# Patient Record
Sex: Female | Born: 1945
Health system: Southern US, Community
[De-identification: ages and names within clinical notes are randomized; demographics above are authoritative.]

## PROBLEM LIST (undated history)

## (undated) DIAGNOSIS — N390 Urinary tract infection, site not specified: Secondary | ICD-10-CM

## (undated) DIAGNOSIS — F0390 Unspecified dementia without behavioral disturbance: Secondary | ICD-10-CM

## (undated) DIAGNOSIS — I10 Essential (primary) hypertension: Secondary | ICD-10-CM

## (undated) HISTORY — PX: TUBAL LIGATION: SHX77

## (undated) HISTORY — PX: APPENDECTOMY: SHX54

## (undated) HISTORY — PX: TONSILLECTOMY: SUR1361

## (undated) HISTORY — DX: Urinary tract infection, site not specified: N39.0

---

## 1998-06-21 ENCOUNTER — Ambulatory Visit (HOSPITAL_COMMUNITY): Admission: RE | Admit: 1998-06-21 | Discharge: 1998-06-21 | Payer: Self-pay | Admitting: Family Medicine

## 1998-06-21 ENCOUNTER — Encounter: Payer: Self-pay | Admitting: Family Medicine

## 2003-12-04 ENCOUNTER — Other Ambulatory Visit: Admission: RE | Admit: 2003-12-04 | Discharge: 2003-12-04 | Payer: Self-pay | Admitting: Family Medicine

## 2004-01-30 ENCOUNTER — Ambulatory Visit (HOSPITAL_BASED_OUTPATIENT_CLINIC_OR_DEPARTMENT_OTHER): Admission: RE | Admit: 2004-01-30 | Discharge: 2004-01-30 | Payer: Self-pay | Admitting: Obstetrics and Gynecology

## 2004-01-30 ENCOUNTER — Ambulatory Visit (HOSPITAL_COMMUNITY): Admission: RE | Admit: 2004-01-30 | Discharge: 2004-01-30 | Payer: Self-pay | Admitting: Obstetrics and Gynecology

## 2012-01-12 DIAGNOSIS — I10 Essential (primary) hypertension: Secondary | ICD-10-CM | POA: Diagnosis not present

## 2012-03-11 ENCOUNTER — Encounter (HOSPITAL_COMMUNITY): Payer: Self-pay | Admitting: *Deleted

## 2012-03-11 ENCOUNTER — Emergency Department (HOSPITAL_COMMUNITY)
Admission: EM | Admit: 2012-03-11 | Discharge: 2012-03-11 | Disposition: A | Discharge status: Home / Self Care | Payer: Medicare Other | Attending: Emergency Medicine | Admitting: Emergency Medicine

## 2012-03-11 ENCOUNTER — Other Ambulatory Visit: Payer: Self-pay

## 2012-03-11 ENCOUNTER — Emergency Department (HOSPITAL_COMMUNITY): Payer: Medicare Other

## 2012-03-11 ENCOUNTER — Ambulatory Visit (HOSPITAL_COMMUNITY): Payer: Medicare Other

## 2012-03-11 DIAGNOSIS — J329 Chronic sinusitis, unspecified: Secondary | ICD-10-CM | POA: Insufficient documentation

## 2012-03-11 DIAGNOSIS — Z7982 Long term (current) use of aspirin: Secondary | ICD-10-CM | POA: Diagnosis not present

## 2012-03-11 DIAGNOSIS — I10 Essential (primary) hypertension: Secondary | ICD-10-CM | POA: Insufficient documentation

## 2012-03-11 DIAGNOSIS — R42 Dizziness and giddiness: Secondary | ICD-10-CM | POA: Diagnosis not present

## 2012-03-11 DIAGNOSIS — R55 Syncope and collapse: Secondary | ICD-10-CM | POA: Diagnosis not present

## 2012-03-11 HISTORY — DX: Essential (primary) hypertension: I10

## 2012-03-11 LAB — URINALYSIS, ROUTINE W REFLEX MICROSCOPIC
Bilirubin Urine: NEGATIVE
Glucose, UA: NEGATIVE mg/dL
Hgb urine dipstick: NEGATIVE
Ketones, ur: NEGATIVE mg/dL
Leukocytes, UA: NEGATIVE
Nitrite: NEGATIVE
Protein, ur: NEGATIVE mg/dL
Specific Gravity, Urine: 1.008 (ref 1.005–1.030)
Urobilinogen, UA: 0.2 mg/dL (ref 0.0–1.0)
pH: 7 (ref 5.0–8.0)

## 2012-03-11 LAB — GLUCOSE, CAPILLARY: Glucose-Capillary: 113 mg/dL — ABNORMAL HIGH (ref 70–99)

## 2012-03-11 LAB — CBC
MCH: 29 pg (ref 26.0–34.0)
Platelets: 250 10*3/uL (ref 150–400)
RBC: 4.59 MIL/uL (ref 3.87–5.11)
RDW: 15.2 % (ref 11.5–15.5)
WBC: 5.5 10*3/uL (ref 4.0–10.5)

## 2012-03-11 LAB — BASIC METABOLIC PANEL
CO2: 28 mEq/L (ref 19–32)
Calcium: 9.7 mg/dL (ref 8.4–10.5)
GFR calc Af Amer: >90 mL/min (ref >90)
Sodium: 137 mEq/L (ref 135–145)

## 2012-03-11 MED ORDER — FLUTICASONE PROPIONATE 50 MCG/ACT NA SUSP: 2.0000 | Freq: Every day | NASAL | Status: DC

## 2012-03-11 NOTE — ED Notes (Signed)
Pt alert and oriented x4. Respirations even and unlabored, bilateral symmetrical rise and fall of chest. Skin warm and dry. In no acute distress. Denies needs.   

## 2012-03-11 NOTE — ED Notes (Signed)
Pt ambulated to restroom with no assistance from staff. Pt reports dizziness is better than before. Pt did want to hang on to the wall to walk but did not have a noticeable unsteady gait.

## 2012-03-11 NOTE — Progress Notes (Signed)
No pcp listed pcp per pt is [facility] EPIC updated

## 2012-03-11 NOTE — ED Notes (Signed)
Pt escorted to d/c window. Verbalized understanding d/c instructions. In no acute distress.  

## 2012-03-11 NOTE — ED Provider Notes (Signed)
History     CSN: [id number]  Arrival date & time 03/11/12  1049   First MD Initiated Contact with Patient 03/11/12 1216      Chief Complaint  Patient presents with  . Dizziness    (Consider location/radiation/quality/duration/timing/severity/associated sxs/prior treatment) HPI Comments: Tanya Jensen is a 66 y.o. Female who is here to be evaluated for dizziness. The patient states that she had an episode of dizziness this morning. That is almost completely resolved. She had a sensation of spinning. This occurred during her usual activities. She did not eat yet today. There was no associated headache, fever, chills, nausea, vomiting, or weakness. He is taking her medications as usual. She last saw her primary care doctor 6 months ago for a blood pressure check. She works as a Engineer, production. There are no modifying factors.  The history is provided by the patient and a relative.    Past Medical History  Diagnosis Date  . Hypertension     Past Surgical History  Procedure Date  . Tubal ligation   . Appendectomy     History reviewed. No pertinent family history.  History  Substance Use Topics  . Smoking status: Never Smoker   . Smokeless tobacco: Not on file  . Alcohol Use: No    OB History    Grav Para Term Preterm Abortions TAB SAB Ect Mult Living                  Review of Systems  All other systems reviewed and are negative.    Allergies  Review of patient's allergies indicates no known allergies.  Home Medications   Current Outpatient Rx  Name  Route  Sig  Dispense  Refill  . ASPIRIN EC 81 MG PO TBEC   Oral   Take 81 mg by mouth daily.         Marland Kitchen CLONIDINE HCL 0.1 MG PO TABS   Oral   Take 0.1 mg by mouth 2 (two) times daily.         . FUROSEMIDE 20 MG PO TABS   Oral   Take 20 mg by mouth daily.         Marland Kitchen LISINOPRIL 20 MG PO TABS   Oral   Take 20 mg by mouth daily.         Marland Kitchen MAGNESIUM HYDROXIDE 400 MG/5ML PO SUSP   Oral  Take 15 mLs by mouth daily as needed.         Marland Kitchen FLUTICASONE PROPIONATE 50 MCG/ACT NA SUSP   Nasal   Place 2 sprays into the nose daily.   16 g   0     BP 114/77  Pulse 64  Temp 97.8 F (36.6 C) (Oral)  Resp 18  SpO2 99%  Physical Exam  Nursing note and vitals reviewed. Constitutional: She is oriented to person, place, and time. She appears well-developed and well-nourished.  HENT:  Head: Normocephalic and atraumatic.  Eyes: Conjunctivae normal and EOM are normal. Pupils are equal, round, and reactive to light.  Neck: Normal range of motion and phonation normal. Neck supple.  Cardiovascular: Normal rate, regular rhythm and intact distal pulses.   Pulmonary/Chest: Effort normal and breath sounds normal. She exhibits no tenderness.  Abdominal: Soft. She exhibits no distension. There is no tenderness. There is no guarding.  Musculoskeletal: Normal range of motion.  Neurological: She is alert and oriented to person, place, and time. She has normal strength. No cranial nerve  deficit. She exhibits normal muscle tone. Coordination normal.       No nystagmus. No ataxia. Normal memory for recent and remote problems.  Skin: Skin is warm and dry.  Psychiatric: She has a normal mood and affect. Her behavior is normal. Judgment and thought content normal.    ED Course  Procedures (including critical care time)  The patient has 2 children with her who informed the nurse, while awaiting the patient, that their mother has a cognitive problem. They report memory problems, getting lost, periods of confusion, and labile emotions.    Reevaluation: 14:35- husband, and 2 other family members are in the room. The patient has been able to walk without problem. Her orthostatics are negative for orthostasis. She has no further symptoms. Followup. Plan discussed with patient and family and written on the discharge material.     Date: 02/19/2012  Rate: 78  Rhythm: normal sinus rhythm  QRS Axis:  normal  PR and QT Intervals: normal  ST/T Wave abnormalities: normal  PR and QRS Conduction Disutrbances:none  Narrative Interpretation:   Old EKG Reviewed: unchanged   Labs Reviewed  BASIC METABOLIC PANEL - Abnormal; Notable for the following:    Potassium 5.6 (*)     Glucose, Bld 133 (*)     GFR calc non Af Amer 86 (*)     All other components within normal limits  GLUCOSE, CAPILLARY - Abnormal; Notable for the following:    Glucose-Capillary 113 (*)     All other components within normal limits  URINALYSIS, ROUTINE W REFLEX MICROSCOPIC  CBC      1. Sinusitis, chronic       MDM  Nonspecific symptoms, with evidence for chronic sinusitis. Most likely etiology is allergic.        Flint Melter, MD 03/16/12 [phone]

## 2012-03-11 NOTE — ED Notes (Signed)
Patient transported to X-ray 

## 2012-03-11 NOTE — ED Notes (Signed)
Pt's daughter handed this nurse a note stating that she thinks that pt is having "cognitive" problems.  She reports that pt having been having memory problems, repeats herself, and would get lost when driving.  She states that pt would become upset and mad everytime they (children) asks her to see her doctor.  She reports that pt's husband does not "own" up to this.  This nurse suggested to her that when the EDP comes in to evaluate pt, to request to speak to him privately.  Note handed back to her.

## 2012-03-14 DIAGNOSIS — J3489 Other specified disorders of nose and nasal sinuses: Secondary | ICD-10-CM | POA: Diagnosis not present

## 2012-03-14 DIAGNOSIS — I10 Essential (primary) hypertension: Secondary | ICD-10-CM | POA: Diagnosis not present

## 2012-06-10 DIAGNOSIS — H40219 Acute angle-closure glaucoma, unspecified eye: Secondary | ICD-10-CM | POA: Diagnosis not present

## 2012-06-14 DIAGNOSIS — H40019 Open angle with borderline findings, low risk, unspecified eye: Secondary | ICD-10-CM | POA: Diagnosis not present

## 2012-06-29 DIAGNOSIS — H401390 Pigmentary glaucoma, unspecified eye, stage unspecified: Secondary | ICD-10-CM | POA: Diagnosis not present

## 2012-07-13 DIAGNOSIS — H401390 Pigmentary glaucoma, unspecified eye, stage unspecified: Secondary | ICD-10-CM | POA: Diagnosis not present

## 2013-01-23 DIAGNOSIS — M79609 Pain in unspecified limb: Secondary | ICD-10-CM | POA: Diagnosis not present

## 2013-01-23 DIAGNOSIS — Z23 Encounter for immunization: Secondary | ICD-10-CM | POA: Diagnosis not present

## 2013-01-23 DIAGNOSIS — L989 Disorder of the skin and subcutaneous tissue, unspecified: Secondary | ICD-10-CM | POA: Diagnosis not present

## 2013-01-23 DIAGNOSIS — I10 Essential (primary) hypertension: Secondary | ICD-10-CM | POA: Insufficient documentation

## 2013-01-23 DIAGNOSIS — M79673 Pain in unspecified foot: Secondary | ICD-10-CM | POA: Insufficient documentation

## 2013-04-04 DIAGNOSIS — L259 Unspecified contact dermatitis, unspecified cause: Secondary | ICD-10-CM | POA: Diagnosis not present

## 2013-04-04 DIAGNOSIS — D485 Neoplasm of uncertain behavior of skin: Secondary | ICD-10-CM | POA: Diagnosis not present

## 2013-04-04 DIAGNOSIS — L980 Pyogenic granuloma: Secondary | ICD-10-CM | POA: Diagnosis not present

## 2013-04-14 DIAGNOSIS — Z9889 Other specified postprocedural states: Secondary | ICD-10-CM | POA: Diagnosis not present

## 2013-06-01 DIAGNOSIS — I10 Essential (primary) hypertension: Secondary | ICD-10-CM | POA: Diagnosis not present

## 2013-06-01 DIAGNOSIS — J069 Acute upper respiratory infection, unspecified: Secondary | ICD-10-CM | POA: Diagnosis not present

## 2013-06-01 DIAGNOSIS — D869 Sarcoidosis, unspecified: Secondary | ICD-10-CM | POA: Diagnosis not present

## 2013-11-15 DIAGNOSIS — H4011X0 Primary open-angle glaucoma, stage unspecified: Secondary | ICD-10-CM | POA: Diagnosis not present

## 2014-02-01 DIAGNOSIS — I10 Essential (primary) hypertension: Secondary | ICD-10-CM | POA: Diagnosis not present

## 2014-02-01 DIAGNOSIS — L989 Disorder of the skin and subcutaneous tissue, unspecified: Secondary | ICD-10-CM | POA: Diagnosis not present

## 2014-02-07 DIAGNOSIS — H4010X3 Unspecified open-angle glaucoma, severe stage: Secondary | ICD-10-CM | POA: Diagnosis not present

## 2014-02-07 DIAGNOSIS — H2513 Age-related nuclear cataract, bilateral: Secondary | ICD-10-CM | POA: Diagnosis not present

## 2014-02-07 DIAGNOSIS — H2511 Age-related nuclear cataract, right eye: Secondary | ICD-10-CM | POA: Insufficient documentation

## 2014-02-21 DIAGNOSIS — H2513 Age-related nuclear cataract, bilateral: Secondary | ICD-10-CM | POA: Diagnosis not present

## 2014-02-21 DIAGNOSIS — H4010X3 Unspecified open-angle glaucoma, severe stage: Secondary | ICD-10-CM | POA: Diagnosis not present

## 2014-02-22 DIAGNOSIS — Z79899 Other long term (current) drug therapy: Secondary | ICD-10-CM | POA: Diagnosis not present

## 2014-02-22 DIAGNOSIS — J45909 Unspecified asthma, uncomplicated: Secondary | ICD-10-CM | POA: Diagnosis not present

## 2014-02-22 DIAGNOSIS — H4010X3 Unspecified open-angle glaucoma, severe stage: Secondary | ICD-10-CM | POA: Diagnosis not present

## 2014-02-22 DIAGNOSIS — H4011X3 Primary open-angle glaucoma, severe stage: Secondary | ICD-10-CM | POA: Diagnosis not present

## 2014-02-22 DIAGNOSIS — I10 Essential (primary) hypertension: Secondary | ICD-10-CM | POA: Diagnosis not present

## 2014-02-22 DIAGNOSIS — H2512 Age-related nuclear cataract, left eye: Secondary | ICD-10-CM | POA: Diagnosis not present

## 2014-02-22 DIAGNOSIS — Z7982 Long term (current) use of aspirin: Secondary | ICD-10-CM | POA: Diagnosis not present

## 2014-02-23 DIAGNOSIS — Z9889 Other specified postprocedural states: Secondary | ICD-10-CM | POA: Insufficient documentation

## 2014-02-23 DIAGNOSIS — H4011X3 Primary open-angle glaucoma, severe stage: Secondary | ICD-10-CM | POA: Diagnosis not present

## 2014-02-26 DIAGNOSIS — Z9889 Other specified postprocedural states: Secondary | ICD-10-CM | POA: Diagnosis not present

## 2014-02-26 DIAGNOSIS — H4011X3 Primary open-angle glaucoma, severe stage: Secondary | ICD-10-CM | POA: Diagnosis not present

## 2014-02-28 DIAGNOSIS — Z9889 Other specified postprocedural states: Secondary | ICD-10-CM | POA: Diagnosis not present

## 2014-02-28 DIAGNOSIS — H4011X3 Primary open-angle glaucoma, severe stage: Secondary | ICD-10-CM | POA: Diagnosis not present

## 2014-03-02 DIAGNOSIS — H269 Unspecified cataract: Secondary | ICD-10-CM | POA: Diagnosis not present

## 2014-03-02 DIAGNOSIS — H209 Unspecified iridocyclitis: Secondary | ICD-10-CM | POA: Diagnosis not present

## 2014-03-02 DIAGNOSIS — H4010X3 Unspecified open-angle glaucoma, severe stage: Secondary | ICD-10-CM | POA: Diagnosis not present

## 2014-03-02 DIAGNOSIS — H2102 Hyphema, left eye: Secondary | ICD-10-CM | POA: Diagnosis not present

## 2014-03-03 DIAGNOSIS — H2102 Hyphema, left eye: Secondary | ICD-10-CM | POA: Diagnosis not present

## 2014-03-04 DIAGNOSIS — H4010X3 Unspecified open-angle glaucoma, severe stage: Secondary | ICD-10-CM | POA: Diagnosis not present

## 2014-03-04 DIAGNOSIS — H2102 Hyphema, left eye: Secondary | ICD-10-CM | POA: Diagnosis not present

## 2014-03-04 DIAGNOSIS — H209 Unspecified iridocyclitis: Secondary | ICD-10-CM | POA: Diagnosis not present

## 2014-03-04 DIAGNOSIS — H269 Unspecified cataract: Secondary | ICD-10-CM | POA: Diagnosis not present

## 2014-03-05 DIAGNOSIS — H209 Unspecified iridocyclitis: Secondary | ICD-10-CM | POA: Diagnosis not present

## 2014-03-05 DIAGNOSIS — H2102 Hyphema, left eye: Secondary | ICD-10-CM | POA: Diagnosis not present

## 2014-03-05 DIAGNOSIS — H4010X3 Unspecified open-angle glaucoma, severe stage: Secondary | ICD-10-CM | POA: Diagnosis not present

## 2014-03-05 DIAGNOSIS — H269 Unspecified cataract: Secondary | ICD-10-CM | POA: Diagnosis not present

## 2014-03-19 DIAGNOSIS — H2013 Chronic iridocyclitis, bilateral: Secondary | ICD-10-CM | POA: Insufficient documentation

## 2014-03-23 DIAGNOSIS — H04122 Dry eye syndrome of left lacrimal gland: Secondary | ICD-10-CM | POA: Insufficient documentation

## 2014-04-06 DIAGNOSIS — H35372 Puckering of macula, left eye: Secondary | ICD-10-CM | POA: Diagnosis not present

## 2014-04-06 DIAGNOSIS — D8689 Sarcoidosis of other sites: Secondary | ICD-10-CM | POA: Diagnosis not present

## 2014-04-06 DIAGNOSIS — H20013 Primary iridocyclitis, bilateral: Secondary | ICD-10-CM | POA: Diagnosis not present

## 2014-04-06 DIAGNOSIS — H20011 Primary iridocyclitis, right eye: Secondary | ICD-10-CM | POA: Diagnosis not present

## 2014-04-06 DIAGNOSIS — H20012 Primary iridocyclitis, left eye: Secondary | ICD-10-CM | POA: Diagnosis not present

## 2014-04-06 DIAGNOSIS — H3581 Retinal edema: Secondary | ICD-10-CM | POA: Diagnosis not present

## 2014-05-01 DIAGNOSIS — D8683 Sarcoid iridocyclitis: Secondary | ICD-10-CM | POA: Diagnosis not present

## 2014-05-01 DIAGNOSIS — B258 Other cytomegaloviral diseases: Secondary | ICD-10-CM | POA: Diagnosis not present

## 2014-05-11 DIAGNOSIS — D8683 Sarcoid iridocyclitis: Secondary | ICD-10-CM | POA: Diagnosis not present

## 2014-05-24 DIAGNOSIS — H3581 Retinal edema: Secondary | ICD-10-CM | POA: Diagnosis not present

## 2014-05-24 DIAGNOSIS — D8683 Sarcoid iridocyclitis: Secondary | ICD-10-CM | POA: Diagnosis not present

## 2014-05-24 DIAGNOSIS — H35372 Puckering of macula, left eye: Secondary | ICD-10-CM | POA: Diagnosis not present

## 2014-05-24 DIAGNOSIS — B258 Other cytomegaloviral diseases: Secondary | ICD-10-CM | POA: Diagnosis not present

## 2014-06-14 DIAGNOSIS — H35372 Puckering of macula, left eye: Secondary | ICD-10-CM | POA: Diagnosis not present

## 2014-06-14 DIAGNOSIS — D8683 Sarcoid iridocyclitis: Secondary | ICD-10-CM | POA: Diagnosis not present

## 2014-06-14 DIAGNOSIS — B258 Other cytomegaloviral diseases: Secondary | ICD-10-CM | POA: Diagnosis not present

## 2014-06-14 DIAGNOSIS — H3581 Retinal edema: Secondary | ICD-10-CM | POA: Diagnosis not present

## 2014-06-27 DIAGNOSIS — H4011X1 Primary open-angle glaucoma, mild stage: Secondary | ICD-10-CM | POA: Diagnosis not present

## 2014-06-27 DIAGNOSIS — H4011X3 Primary open-angle glaucoma, severe stage: Secondary | ICD-10-CM | POA: Diagnosis not present

## 2014-07-02 DIAGNOSIS — T1512XA Foreign body in conjunctival sac, left eye, initial encounter: Secondary | ICD-10-CM | POA: Diagnosis not present

## 2014-07-02 DIAGNOSIS — H4011X1 Primary open-angle glaucoma, mild stage: Secondary | ICD-10-CM | POA: Diagnosis not present

## 2014-07-02 DIAGNOSIS — H4011X3 Primary open-angle glaucoma, severe stage: Secondary | ICD-10-CM | POA: Diagnosis not present

## 2014-07-05 DIAGNOSIS — H35352 Cystoid macular degeneration, left eye: Secondary | ICD-10-CM | POA: Diagnosis not present

## 2014-07-05 DIAGNOSIS — D8683 Sarcoid iridocyclitis: Secondary | ICD-10-CM | POA: Diagnosis not present

## 2014-07-05 DIAGNOSIS — H4011X3 Primary open-angle glaucoma, severe stage: Secondary | ICD-10-CM | POA: Diagnosis not present

## 2014-07-05 DIAGNOSIS — H4011X1 Primary open-angle glaucoma, mild stage: Secondary | ICD-10-CM | POA: Diagnosis not present

## 2014-07-18 DIAGNOSIS — H43811 Vitreous degeneration, right eye: Secondary | ICD-10-CM | POA: Diagnosis not present

## 2014-08-15 DIAGNOSIS — H35352 Cystoid macular degeneration, left eye: Secondary | ICD-10-CM | POA: Diagnosis not present

## 2014-08-15 DIAGNOSIS — H43812 Vitreous degeneration, left eye: Secondary | ICD-10-CM | POA: Diagnosis not present

## 2014-08-15 DIAGNOSIS — D8683 Sarcoid iridocyclitis: Secondary | ICD-10-CM | POA: Diagnosis not present

## 2014-08-15 DIAGNOSIS — H43811 Vitreous degeneration, right eye: Secondary | ICD-10-CM | POA: Diagnosis not present

## 2014-08-15 DIAGNOSIS — H43813 Vitreous degeneration, bilateral: Secondary | ICD-10-CM | POA: Diagnosis not present

## 2014-09-26 DIAGNOSIS — H2013 Chronic iridocyclitis, bilateral: Secondary | ICD-10-CM | POA: Diagnosis not present

## 2014-10-03 DIAGNOSIS — H2011 Chronic iridocyclitis, right eye: Secondary | ICD-10-CM | POA: Diagnosis not present

## 2014-10-03 DIAGNOSIS — D8683 Sarcoid iridocyclitis: Secondary | ICD-10-CM | POA: Diagnosis not present

## 2014-10-05 DIAGNOSIS — D8683 Sarcoid iridocyclitis: Secondary | ICD-10-CM | POA: Diagnosis not present

## 2014-10-16 ENCOUNTER — Emergency Department (HOSPITAL_BASED_OUTPATIENT_CLINIC_OR_DEPARTMENT_OTHER)
Admission: EM | Admit: 2014-10-16 | Discharge: 2014-10-16 | Disposition: A | Discharge status: Home / Self Care | Payer: Medicare Other | Attending: Emergency Medicine | Admitting: Emergency Medicine

## 2014-10-16 ENCOUNTER — Emergency Department (HOSPITAL_BASED_OUTPATIENT_CLINIC_OR_DEPARTMENT_OTHER): Payer: Medicare Other

## 2014-10-16 ENCOUNTER — Encounter (HOSPITAL_BASED_OUTPATIENT_CLINIC_OR_DEPARTMENT_OTHER): Payer: Self-pay

## 2014-10-16 DIAGNOSIS — W1839XA Other fall on same level, initial encounter: Secondary | ICD-10-CM | POA: Insufficient documentation

## 2014-10-16 DIAGNOSIS — W19XXXA Unspecified fall, initial encounter: Secondary | ICD-10-CM

## 2014-10-16 DIAGNOSIS — S80212A Abrasion, left knee, initial encounter: Secondary | ICD-10-CM | POA: Insufficient documentation

## 2014-10-16 DIAGNOSIS — S8991XA Unspecified injury of right lower leg, initial encounter: Secondary | ICD-10-CM | POA: Diagnosis not present

## 2014-10-16 DIAGNOSIS — S63276A Dislocation of unspecified interphalangeal joint of right little finger, initial encounter: Secondary | ICD-10-CM | POA: Diagnosis not present

## 2014-10-16 DIAGNOSIS — S8001XA Contusion of right knee, initial encounter: Secondary | ICD-10-CM

## 2014-10-16 DIAGNOSIS — S62616A Displaced fracture of proximal phalanx of right little finger, initial encounter for closed fracture: Secondary | ICD-10-CM | POA: Diagnosis not present

## 2014-10-16 DIAGNOSIS — Z79899 Other long term (current) drug therapy: Secondary | ICD-10-CM | POA: Insufficient documentation

## 2014-10-16 DIAGNOSIS — Y998 Other external cause status: Secondary | ICD-10-CM | POA: Insufficient documentation

## 2014-10-16 DIAGNOSIS — I10 Essential (primary) hypertension: Secondary | ICD-10-CM | POA: Diagnosis not present

## 2014-10-16 DIAGNOSIS — S6991XA Unspecified injury of right wrist, hand and finger(s), initial encounter: Secondary | ICD-10-CM | POA: Diagnosis present

## 2014-10-16 DIAGNOSIS — S63209A Unspecified subluxation of unspecified finger, initial encounter: Secondary | ICD-10-CM

## 2014-10-16 DIAGNOSIS — S63259A Unspecified dislocation of unspecified finger, initial encounter: Secondary | ICD-10-CM

## 2014-10-16 DIAGNOSIS — Y92093 Driveway of other non-institutional residence as the place of occurrence of the external cause: Secondary | ICD-10-CM | POA: Diagnosis not present

## 2014-10-16 DIAGNOSIS — Y9389 Activity, other specified: Secondary | ICD-10-CM | POA: Diagnosis not present

## 2014-10-16 DIAGNOSIS — M25561 Pain in right knee: Secondary | ICD-10-CM | POA: Diagnosis not present

## 2014-10-16 MED ORDER — LIDOCAINE HCL (PF) 1 % IJ SOLN
5.0000 mL | Freq: Once | INTRAMUSCULAR | Status: AC
  Administered 2014-10-16: 5 mL via INTRADERMAL
  Filled 2014-10-16: qty 5

## 2014-10-16 MED ORDER — HYDROCODONE-ACETAMINOPHEN 5-325 MG PO TABS
1.0000 | ORAL_TABLET | Freq: Once | ORAL | Status: AC
  Administered 2014-10-16: 1 via ORAL
  Filled 2014-10-16: qty 1

## 2014-10-16 MED ORDER — HYDROCODONE-ACETAMINOPHEN 5-325 MG PO TABS: 1.0000 | ORAL_TABLET | Freq: Four times a day (QID) | ORAL | Status: DC | PRN

## 2014-10-16 NOTE — Discharge Instructions (Signed)
Finger Dislocation Finger dislocation is the displacement of bones in your finger at the joints. Most commonly, finger dislocation occurs at the proximal interphalangeal joint (the joint closest to your knuckle). Very strong, fibrous tissues (ligaments) and joint capsules connect the three bones of your fingers.  CAUSES Dislocation is caused by a forceful impact. This impact moves these bones off the joint and often tears your ligaments.  SYMPTOMS Symptoms of finger dislocation include:  Deformity of your finger.  Pain, with loss of movement. DIAGNOSIS  Finger dislocation is diagnosed with a physical exam. Often, X-ray exams are done to see if you have associated injuries, such as bone fractures. TREATMENT  Finger dislocations are treated by putting your bones back into position (reduction) either by manually moving the bones back into place or through surgery. Your finger is then kept in a fixed position (immobilized) with the use of a dressing or splint for a brief period. When your ligament has to be surgically repaired, it needs to be kept in a fixed position with a dressing or splint for 1 to 2 weeks. Because joint stiffness is a long-term complication of finger dislocation, hand exercises or physical therapy to increase the range of motion and to regain strength is usually started as soon as the ligament is healed. Exercises and therapy generally last no more than 3 months. HOME CARE INSTRUCTIONS The following measures can help to reduce pain and speed up the healing process:  Rest your injured joint. Do not move until instructed otherwise by your caregiver. Avoid activities similar to the one that caused your injury.  Apply ice to your injured joint for the first day or 2 after your reduction or as directed by your caregiver. Applying ice helps to reduce inflammation and pain.  Put ice in a plastic bag.  Place a towel between your skin and the bag.  Leave the ice on for 15-20 minutes  at a time, every 2 hours while you are awake.  Elevate your hand above your heart as directed by your caregiver to reduce swelling.  Take over-the-counter or prescription medicine for pain as your caregiver instructs you. SEEK IMMEDIATE MEDICAL CARE IF:  Your dressing or splint becomes damaged.  Your pain becomes worse rather than better.  You lose feeling in your finger, or it becomes cold and white. MAKE SURE YOU:  Understand these instructions.  Will watch your condition.  Will get help right away if you are not doing well or get worse. Document Released: 04/17/2000 Document Revised: 07/13/2011 Document Reviewed: 02/08/2011 [facility] Patient Information 2015 [place], [place]. This information is not intended to replace advice given to you by your health care provider. Make sure you discuss any questions you have with your health care provider.

## 2014-10-16 NOTE — ED Notes (Addendum)
Pt fell this am from standing-denies syncope-injury to right little finger-deformity noted/and pain to both knees-pt now states pain to right 4th and 5th fingers

## 2014-10-16 NOTE — ED Notes (Signed)
Patient transported to X-ray 

## 2014-10-16 NOTE — ED Provider Notes (Signed)
CSN: [id number]     Arrival date & time 10/16/14  1124 History   First MD Initiated Contact with Patient 10/16/14 1237     Chief Complaint  Patient presents with  . Fall     Patient is a 69 y.o. female presenting with fall. The history is provided by the patient. No language interpreter was used.  Fall   Ms. Demetria Pore for evaluation of injuries on the fall. She had a fall about 10:30 this morning in the driveway. She is not sure the cause of her fall but she feels like she tripped over something in the driveway that she did not see. She fell onto bilateral knees and right hands. She reports pain in her right knee and her right fifth finger. Sxs are moderate and constant.  She is right handed.   Past Medical History  Diagnosis Date  . Hypertension    Past Surgical History  Procedure Laterality Date  . Tubal ligation    . Appendectomy     No family history on file. History  Substance Use Topics  . Smoking status: Never Smoker   . Smokeless tobacco: Not on file  . Alcohol Use: No   OB History    No data available     Review of Systems  All other systems reviewed and are negative.     Allergies  Review of patient's allergies indicates no known allergies.  Home Medications   Prior to Admission medications   Medication Sig Start Date End Date Taking? Authorizing Provider  cloNIDine (CATAPRES) 0.1 MG tablet Take 0.1 mg by mouth 2 (two) times daily.    Historical Provider, MD  furosemide (LASIX) 20 MG tablet Take 20 mg by mouth daily.    Historical Provider, MD  lisinopril (PRINIVIL,ZESTRIL) 20 MG tablet Take 20 mg by mouth daily.    Historical Provider, MD   BP 142/68 mmHg  Pulse 70  Temp(Src) 98.4 F (36.9 C) (Oral)  Resp 18  Ht 5\' 5"  (1.651 m)  Wt 220 lb (99.791 kg)  BMI 36.61 kg/m2  SpO2 99% Physical Exam  Constitutional: She is oriented to person, place, and time. She appears well-developed and well-nourished.  HENT:  Head: Normocephalic and  atraumatic.  Cardiovascular: Normal rate and regular rhythm.   No murmur heard. Pulmonary/Chest: Effort normal and breath sounds normal. No respiratory distress.  Abdominal: Soft. There is no tenderness. There is no rebound and no guarding.  Musculoskeletal:   Tenderness to right anterior knee with normal range of motion. Abrasions to bilateral knees. No ankle or pelvis tenderness. There is a deformity of the right fifth digit with decreased range of motion. There is local tenderness over the fifth digit. Fingers well perfused.  Neurological: She is alert and oriented to person, place, and time.  Skin: Skin is warm and dry.  Psychiatric: She has a normal mood and affect. Her behavior is normal.  Nursing note and vitals reviewed.   ED Course  Procedures (including critical care time) NERVE BLOCK Performed by: Quintella Reichert Consent: Verbal consent obtained. Required items: required blood products, implants, devices, and special equipment available Time out: Immediately prior to procedure a "time out" was called to verify the correct patient, procedure, equipment, support staff and site/side marked as required.  Indication: finger dislocation reduction Nerve block body site: right fifth digit  Preparation: Patient was prepped and draped in the usual sterile fashion. Needle gauge: 25 G Location technique: anatomical landmarks  Local anesthetic:lidocaine 1%  Anesthetic  total: 3 ml  Outcome: pain improved Patient tolerance: Patient tolerated the procedure well with no immediate complications.  Reduction of dislocation Date/Time: 3:34 PM Performed by: Quintella Reichert Authorized by: Quintella Reichert Consent: Verbal consent obtained. Risks and benefits: risks, benefits and alternatives were discussed Consent given by: patient   Vitals: Vital signs were monitored during sedation. Patient tolerance: Patient tolerated the procedure well with no immediate complications. Joint: fifth  digit PIP  Reduction technique:traction    Labs Review Labs Reviewed - No data to display  Imaging Review Dg Knee Complete 4 Views Right  10/16/2014   CLINICAL DATA:  Fall today with pain in the right knee. Initial encounter.  EXAM: RIGHT KNEE - COMPLETE 4+ VIEW  COMPARISON:  None.  FINDINGS: Sensitivity is limited by patient size and soft tissue attenuation.  No acute fracture or dislocation is noted. Presence of joint effusion cannot be established due to above limitations.  Tricompartmental knee osteoarthritis which is advanced and diffuse.  IMPRESSION: 1. Limited study due to patient body habitus. 2. No acute findings. 3. Advanced tricompartmental osteoarthritis.   Electronically Signed   By: Monte Fantasia M.D.   On: 10/16/2014 12:54   Dg Hand Complete Right  10/16/2014   CLINICAL DATA:  Pain and swelling and deformity of the right little finger after the patient fell in her driveway today.  EXAM: RIGHT HAND - COMPLETE 3+ VIEW  COMPARISON:  None.  FINDINGS: There is dislocation of the PIP joint of the little finger. The middle phalanx is displaced dorsally. There are arthritic changes in the base of the middle phalanx with subcortical cyst formation. No appreciable fracture. There is slight osteoarthritis of the DIP joints of the second, third and fifth digits as well as of the IP joint of the thumb. Metacarpophalangeal joints are normal. Minimal spurring on the lunate.  IMPRESSION: Dislocation of the PIP joint of the little finger. No acute fracture.   Electronically Signed   By: Lorriane Shire M.D.   On: 10/16/2014 12:01     EKG Interpretation None      MDM   Final diagnoses:  Knee contusion, right, initial encounter  Dislocation or subluxation of digit of hand, initial encounter  Fall, initial encounter     Patient here for evaluation of injuries following a fall. She does have some knee tenderness but is able to ambulate without difficulty, clinical picture is not consistent  with acute fracture of the knee or tibial plateau. Patient does have a right fifth PIP dislocation, reduced in the emergency department and placed in a splint in flexion. Discussed with Dr. Grandville Silos with orthopedics, plan to follow up in the office. Discussed the patient home care and return precautions.   Quintella Reichert, MD 10/16/14 [phone]

## 2014-10-24 DIAGNOSIS — H4011X1 Primary open-angle glaucoma, mild stage: Secondary | ICD-10-CM | POA: Diagnosis not present

## 2014-10-24 DIAGNOSIS — H2513 Age-related nuclear cataract, bilateral: Secondary | ICD-10-CM | POA: Diagnosis not present

## 2014-10-24 DIAGNOSIS — H4011X4 Primary open-angle glaucoma, indeterminate stage: Secondary | ICD-10-CM | POA: Diagnosis not present

## 2014-10-24 DIAGNOSIS — H4011X3 Primary open-angle glaucoma, severe stage: Secondary | ICD-10-CM | POA: Diagnosis not present

## 2014-10-24 DIAGNOSIS — H2000 Unspecified acute and subacute iridocyclitis: Secondary | ICD-10-CM | POA: Diagnosis not present

## 2014-10-25 DIAGNOSIS — S63634A Sprain of interphalangeal joint of right ring finger, initial encounter: Secondary | ICD-10-CM | POA: Diagnosis not present

## 2014-10-25 DIAGNOSIS — S63276A Dislocation of unspecified interphalangeal joint of right little finger, initial encounter: Secondary | ICD-10-CM | POA: Diagnosis not present

## 2014-11-21 DIAGNOSIS — H35352 Cystoid macular degeneration, left eye: Secondary | ICD-10-CM | POA: Diagnosis not present

## 2014-11-21 DIAGNOSIS — D8683 Sarcoid iridocyclitis: Secondary | ICD-10-CM | POA: Diagnosis not present

## 2014-11-21 DIAGNOSIS — H43811 Vitreous degeneration, right eye: Secondary | ICD-10-CM | POA: Diagnosis not present

## 2014-12-07 DIAGNOSIS — H35352 Cystoid macular degeneration, left eye: Secondary | ICD-10-CM | POA: Diagnosis not present

## 2014-12-07 DIAGNOSIS — D8683 Sarcoid iridocyclitis: Secondary | ICD-10-CM | POA: Diagnosis not present

## 2015-01-04 DIAGNOSIS — I10 Essential (primary) hypertension: Secondary | ICD-10-CM | POA: Diagnosis not present

## 2015-01-04 DIAGNOSIS — R413 Other amnesia: Secondary | ICD-10-CM | POA: Diagnosis not present

## 2015-01-04 DIAGNOSIS — D869 Sarcoidosis, unspecified: Secondary | ICD-10-CM | POA: Diagnosis not present

## 2015-01-04 DIAGNOSIS — H2013 Chronic iridocyclitis, bilateral: Secondary | ICD-10-CM | POA: Diagnosis not present

## 2015-01-04 DIAGNOSIS — M545 Low back pain: Secondary | ICD-10-CM | POA: Diagnosis not present

## 2015-01-15 ENCOUNTER — Ambulatory Visit (INDEPENDENT_AMBULATORY_CARE_PROVIDER_SITE_OTHER): Payer: Medicare Other | Admitting: Neurology

## 2015-01-15 ENCOUNTER — Encounter: Payer: Self-pay | Admitting: Neurology

## 2015-01-15 VITALS — BP 165/98 | HR 89 | Ht 65.0 in | Wt 318.6 lb

## 2015-01-15 DIAGNOSIS — E519 Thiamine deficiency, unspecified: Secondary | ICD-10-CM

## 2015-01-15 DIAGNOSIS — E538 Deficiency of other specified B group vitamins: Secondary | ICD-10-CM | POA: Diagnosis not present

## 2015-01-15 DIAGNOSIS — F0391 Unspecified dementia with behavioral disturbance: Secondary | ICD-10-CM

## 2015-01-15 DIAGNOSIS — R443 Hallucinations, unspecified: Secondary | ICD-10-CM

## 2015-01-15 DIAGNOSIS — R413 Other amnesia: Secondary | ICD-10-CM

## 2015-01-15 DIAGNOSIS — F22 Delusional disorders: Secondary | ICD-10-CM | POA: Diagnosis not present

## 2015-01-15 DIAGNOSIS — R5382 Chronic fatigue, unspecified: Secondary | ICD-10-CM | POA: Diagnosis not present

## 2015-01-15 DIAGNOSIS — F03918 Unspecified dementia, unspecified severity, with other behavioral disturbance: Secondary | ICD-10-CM

## 2015-01-15 NOTE — Progress Notes (Signed)
[facility]    Provider:  Dr Jaynee Eagles Referring Provider: Shirline Frees, MD Primary Care Physician:  Shirline Frees, MD  CC:  forgetfullness  HPI:  Tanya Jensen is a 69 y.o. female here as a referral from Dr. Kenton Kingfisher for forgetfullness. PMHx HTN. She says she drives, she never gets lost. She was hallucinating. She has memory loss. She thought someone was in the home. Multiple times. She see people in church. She saw cowgirls one day. Daughter and husband are here. Daughter brought her to church and her mom hugged her and didn't remember her. Aroiund the age of 53 she started repeating things. Noticed it 8 years ago. Getting worse. Kids had a meeting with the husband and wanted a workup completed. More recent memory than remote. She is more combative. She is changing a little bit. She has glaucoma. Vision is worsening. She has a cataract developing on one eye. She was in bed one night, she was awakened . She saw a lady on the wall. She thought the headrest in the car were people. Last Thursday she thought she saw a white female in the bathroom. She is forgetting streets she has been on in the past frequently. She calls multiple people telling them she is seeing things, says people are waiting in the car. She has called multiple people to check the house. She called someone from church 25 times. No shuffling gait or tremors. Patient's mother and grandmother had dementia, started around 78. She has lost her house and her car due to mismanagement of finances. They had to stay in a hotel when they lost everything. She is not bathing. She used to be very sharp, now she is forgetting she has rental property. Slowly progressive. She doesn't remember a lot of the incidents. She is getting lost. She should not be driving anywhere.    Reviewed notes, labs and imaging from outside physicians, which showed:  CT of the head 03/2012:personally reviewed and agree with findings below  Comparison:  None.  Findings: The ventricles are normal in size, for this patient's age, and normal in configuration.  here are no parenchymal masses or mass effect. There are no areas of abnormal parenchymal attenuation. There is no evidence of a recent infarct.  There are no extra-axial masses or abnormal fluid collections.  No intracranial hemorrhage.  Right maxillary sinus mucosal thickening. The remaining visualized sinuses and mastoid air cells are clear. No skull lesion/fracture.  IMPRESSION: No intracranial abnormality. Right maxillary sinus mucosal thickening.  TSH wnl, ACE < 14,   Review of Systems: Patient complains of symptoms per HPI as well as the following symptoms: confusion, hallucinations, denies CP,SOB,Fevers or systemic signs. Pertinent negatives per HPI. All others negative.   Social History   Social History  . Marital Status: Married    Spouse Name: [name]  . Number of Children: 3  . Years of Education: 12   Occupational History  . Not on file.   Social History Main Topics  . Smoking status: Never Smoker   . Smokeless tobacco: Not on file  . Alcohol Use: No  . Drug Use: No  . Sexual Activity: Not on file   Other Topics Concern  . Not on file   Social History Narrative   Lives at home with husband, [name].    Family History  Problem Relation Age of Onset  . Dementia Mother     Past Medical History  Diagnosis Date  . Hypertension     Past  Surgical History  Procedure Laterality Date  . Tubal ligation    . Appendectomy      Current Outpatient Prescriptions  Medication Sig Dispense Refill  . aspirin EC 81 MG tablet Take 81 mg by mouth daily.    Marland Kitchen atropine 1 % ophthalmic solution Apply to eye.    . bimatoprost (LUMIGAN) 0.01 % SOLN 1 drop.    . Brinzolamide-Brimonidine 1-0.2 % SUSP 0.05 mLs.    . cloNIDine (CATAPRES) 0.1 MG tablet Take 0.1 mg by mouth 2 (two) times daily.    . Difluprednate 0.05 % EMUL Use as directed    . furosemide  (LASIX) 20 MG tablet Take 20 mg by mouth daily.    Marland Kitchen HYDROcodone-acetaminophen (NORCO/VICODIN) 5-325 MG per tablet Take 1 tablet by mouth every 6 (six) hours as needed. 6 tablet 0  . ketorolac (ACULAR) 0.5 % ophthalmic solution Insert one drop 4 times a day into surgical eye starting 4 days prior to surgery    . Linaclotide (LINZESS) 145 MCG CAPS capsule Take 145 mcg by mouth.    Marland Kitchen lisinopril (PRINIVIL,ZESTRIL) 20 MG tablet Take 20 mg by mouth daily.    . predniSONE (DELTASONE) 10 MG tablet Take 5 mg by mouth 2 (two) times daily.    . timolol (TIMOPTIC) 0.5 % ophthalmic solution Apply to eye.     No current facility-administered medications for this visit.    Allergies as of 01/15/2015  . (No Known Allergies)    Vitals: BP 165/98 mmHg  Pulse 89  Ht 5\' 5"  (1.651 m)  Wt 318 lb 9.6 oz (144.516 kg)  BMI 53.02 kg/m2 Last Weight:  Wt Readings from Last 1 Encounters:  01/15/15 318 lb 9.6 oz (144.516 kg)   Last Height:   Ht Readings from Last 1 Encounters:  01/15/15 5\' 5"  (1.651 m)    Physical exam: Exam: Gen: agitated, obese, well groomed                     CV: RRR, no MRG. No Carotid Bruits. No peripheral edema, warm, nontender Eyes: Conjunctivae clear without exudates or hemorrhage  Neuro: Detailed Neurologic Exam  Speech:    Speech is normal; fluent and spontaneous with normal comprehension.  Cognition:  Montreal Cognitive Assessment  01/15/2015  Visuospatial/ Executive (0/5) 1  Naming (0/3) 2  Attention: Read list of digits (0/2) 2  Attention: Read list of letters (0/1) 1  Attention: Serial 7 subtraction starting at 100 (0/3) 2  Language: Repeat phrase (0/2) 0  Language : Fluency (0/1) 1  Abstraction (0/2) 1  Delayed Recall (0/5) 0  Orientation (0/6) 1  Total 11  Adjusted Score (based on education) 12       The patient is oriented to person    recent and remote memory impaired;     language fluent;     Impaired attention, concentration,     fund of knowledge  impaired Cranial Nerves:    The pupils are equal, round, and reactive to light. The fundi are flat. Visual fields are full to finger confrontation. Extraocular movements are intact. Trigeminal sensation is intact and the muscles of mastication are normal. The face is symmetric. The palate elevates in the midline. Hearing intact. Voice is normal. Shoulder shrug is normal. The tongue has normal motion without fasciculations.   Coordination:    Normal finger to nose. Cant do HTS due to body habitus  Gait: wide based due to body habitus      Motor  Observation:    No asymmetry, no atrophy, and no involuntary movements noted. Tone:    Normal muscle tone.    Posture:    Posture is normal. normal erect    Strength: Mild hip flexion weakness.     Strength is V/V in the upper and lower limbs.      Sensation: intact to LT     Reflex Exam:  DTR's:    Deep tendon reflexes in the upper and lower extremities are normal bilaterally.   Toes:    The toes are downgoing bilaterally.   Clonus:    Clonus is absent.  +glabellar, +snout    Assessment/Plan:  This is a 69 year old female who is here for at least 8 years of progressive memory loss. Montreal cognitive assessment test is 12 out of 30 in the office today. There are behavioral symptoms as well as hallucinations and delusions. I do recommend following up with primary care to ensure there is no metabolic or toxic abnormalities that can be contributing however it does appear that patient has dementia. No signs of parkinsonism.  We'll order dementia workup including MRI of the brain, EEG and labs. Daughter: [phone]. Patient and husband have given verbal consent to discuss all matters with daughter.     Sarina Ill, MD  [facility] [address] [place],  [address]  Phone [phone] Fax [phone]

## 2015-01-15 NOTE — Patient Instructions (Signed)
Overall you are doing fairly well but I do want to suggest a few things today:   Remember to drink plenty of fluid, eat healthy meals and do not skip any meals. Try to eat protein with a every meal and eat a healthy snack such as fruit or nuts in between meals. Try to keep a regular sleep-wake schedule and try to exercise daily, particularly in the form of walking, 20-30 minutes a day, if you can.   As far as diagnostic testing: MRI of the brain, EEG, labs  I would like to see you back in 3 months, sooner if we need to. Please call us with any interim questions, concerns, problems, updates or refill requests.   Please also call us for any test results so we can go over those with you on the phone.  My clinical assistant and will answer any of your questions and relay your messages to me and also relay most of my messages to you.   Our phone number is [phone]. We also have an after hours call service for urgent matters and there is a physician on-call for urgent questions. For any emergencies you know to call 911 or go to the nearest emergency room

## 2015-01-16 ENCOUNTER — Encounter: Payer: Self-pay | Admitting: Neurology

## 2015-01-16 DIAGNOSIS — F0391 Unspecified dementia with behavioral disturbance: Secondary | ICD-10-CM | POA: Insufficient documentation

## 2015-01-16 DIAGNOSIS — R443 Hallucinations, unspecified: Secondary | ICD-10-CM | POA: Insufficient documentation

## 2015-01-16 DIAGNOSIS — F22 Delusional disorders: Secondary | ICD-10-CM | POA: Insufficient documentation

## 2015-01-16 DIAGNOSIS — F03918 Unspecified dementia, unspecified severity, with other behavioral disturbance: Secondary | ICD-10-CM | POA: Insufficient documentation

## 2015-01-17 ENCOUNTER — Telehealth: Payer: Self-pay | Admitting: *Deleted

## 2015-01-17 LAB — CBC
HEMATOCRIT: 38.4 % (ref 34.0–46.6)
HEMOGLOBIN: 12.7 g/dL (ref 11.1–15.9)
MCH: 27.6 pg (ref 26.6–33.0)
MCHC: 33.1 g/dL (ref 31.5–35.7)
MCV: 84 fL (ref 79–97)
Platelets: 235 10*3/uL (ref 150–379)
RBC: 4.6 x10E6/uL (ref 3.77–5.28)
RDW: 16.5 % — AB (ref 12.3–15.4)
WBC: 7.1 10*3/uL (ref 3.4–10.8)

## 2015-01-17 LAB — COMPREHENSIVE METABOLIC PANEL
ALK PHOS: 85 IU/L (ref 39–117)
ALT: 10 IU/L (ref 0–32)
AST: 17 IU/L (ref 0–40)
Albumin/Globulin Ratio: 1.3 (ref 1.1–2.5)
Albumin: 3.9 g/dL (ref 3.6–4.8)
BILIRUBIN TOTAL: 0.6 mg/dL (ref 0.0–1.2)
BUN/Creatinine Ratio: 8 — ABNORMAL LOW (ref 11–26)
BUN: 6 mg/dL — AB (ref 8–27)
CHLORIDE: 104 mmol/L (ref 97–108)
CO2: 25 mmol/L (ref 18–29)
Calcium: 9.8 mg/dL (ref 8.7–10.3)
Creatinine, Ser: 0.8 mg/dL (ref 0.57–1.00)
GFR calc Af Amer: 88 mL/min/{1.73_m2} (ref >59)
GFR calc non Af Amer: 76 mL/min/{1.73_m2} (ref >59)
GLUCOSE: 117 mg/dL — AB (ref 65–99)
Globulin, Total: 2.9 g/dL (ref 1.5–4.5)
POTASSIUM: 3.6 mmol/L (ref 3.5–5.2)
Sodium: 145 mmol/L — ABNORMAL HIGH (ref 134–144)
Total Protein: 6.8 g/dL (ref 6.0–8.5)

## 2015-01-17 LAB — RPR: RPR: NONREACTIVE

## 2015-01-17 LAB — METHYLMALONIC ACID, SERUM: Methylmalonic Acid: 150 nmol/L (ref 0–378)

## 2015-01-17 LAB — B12 AND FOLATE PANEL
Folate: 12.1 ng/mL (ref >3.0)
Vitamin B-12: 609 pg/mL (ref 211–946)

## 2015-01-17 LAB — VITAMIN B1: THIAMINE: 73.8 nmol/L (ref 66.5–200.0)

## 2015-01-17 LAB — HIV ANTIBODY (ROUTINE TESTING W REFLEX): HIV SCREEN 4TH GENERATION: NONREACTIVE

## 2015-01-17 NOTE — Telephone Encounter (Signed)
Spouse returned call. Spouse advised Labs were unremarkable.

## 2015-01-17 NOTE — Telephone Encounter (Signed)
Called and LVM for pt to call back about results. Okay to inform her labs were unremarkable per Dr. Jaynee Eagles. Thank you. Gave [facility] phone number and office hours. Informed her Dr. Jaynee Eagles and I are not in the office on Friday's.

## 2015-01-23 ENCOUNTER — Encounter (HOSPITAL_COMMUNITY): Payer: Self-pay | Admitting: Emergency Medicine

## 2015-01-23 ENCOUNTER — Emergency Department (HOSPITAL_COMMUNITY)
Admission: EM | Admit: 2015-01-23 | Discharge: 2015-01-23 | Disposition: A | Discharge status: Home / Self Care | Payer: Medicare Other | Attending: Emergency Medicine | Admitting: Emergency Medicine

## 2015-01-23 DIAGNOSIS — Z7982 Long term (current) use of aspirin: Secondary | ICD-10-CM | POA: Diagnosis not present

## 2015-01-23 DIAGNOSIS — R410 Disorientation, unspecified: Secondary | ICD-10-CM | POA: Diagnosis not present

## 2015-01-23 DIAGNOSIS — E669 Obesity, unspecified: Secondary | ICD-10-CM | POA: Diagnosis not present

## 2015-01-23 DIAGNOSIS — I10 Essential (primary) hypertension: Secondary | ICD-10-CM | POA: Insufficient documentation

## 2015-01-23 DIAGNOSIS — Z79899 Other long term (current) drug therapy: Secondary | ICD-10-CM | POA: Insufficient documentation

## 2015-01-23 DIAGNOSIS — R4182 Altered mental status, unspecified: Secondary | ICD-10-CM | POA: Diagnosis present

## 2015-01-23 LAB — COMPREHENSIVE METABOLIC PANEL
ALBUMIN: 3.9 g/dL (ref 3.5–5.0)
ALT: 16 U/L (ref 14–54)
AST: 29 U/L (ref 15–41)
Alkaline Phosphatase: 78 U/L (ref 38–126)
Anion gap: 8 (ref 5–15)
BUN: 11 mg/dL (ref 6–20)
CHLORIDE: 106 mmol/L (ref 101–111)
CO2: 27 mmol/L (ref 22–32)
Calcium: 9.5 mg/dL (ref 8.9–10.3)
Creatinine, Ser: 0.81 mg/dL (ref 0.44–1.00)
GFR calc Af Amer: >60 mL/min (ref >60)
Glucose, Bld: 134 mg/dL — ABNORMAL HIGH (ref 65–99)
POTASSIUM: 3.5 mmol/L (ref 3.5–5.1)
SODIUM: 141 mmol/L (ref 135–145)
Total Bilirubin: 0.8 mg/dL (ref 0.3–1.2)
Total Protein: 7.8 g/dL (ref 6.5–8.1)

## 2015-01-23 LAB — CBC
HEMATOCRIT: 40.5 % (ref 36.0–46.0)
Hemoglobin: 13.5 g/dL (ref 12.0–15.0)
MCH: 27.7 pg (ref 26.0–34.0)
MCHC: 33.3 g/dL (ref 30.0–36.0)
MCV: 83.2 fL (ref 78.0–100.0)
Platelets: 254 10*3/uL (ref 150–400)
RBC: 4.87 MIL/uL (ref 3.87–5.11)
RDW: 15.9 % — AB (ref 11.5–15.5)
WBC: 9.5 10*3/uL (ref 4.0–10.5)

## 2015-01-23 LAB — CBG MONITORING, ED: Glucose-Capillary: 137 mg/dL — ABNORMAL HIGH (ref 65–99)

## 2015-01-23 LAB — URINE MICROSCOPIC-ADD ON

## 2015-01-23 LAB — URINALYSIS, ROUTINE W REFLEX MICROSCOPIC
Glucose, UA: NEGATIVE mg/dL
Hgb urine dipstick: NEGATIVE
KETONES UR: NEGATIVE mg/dL
NITRITE: NEGATIVE
PH: 5 (ref 5.0–8.0)
PROTEIN: NEGATIVE mg/dL
Specific Gravity, Urine: 1.017 (ref 1.005–1.030)
Urobilinogen, UA: 1 mg/dL (ref 0.0–1.0)

## 2015-01-23 NOTE — Discharge Instructions (Signed)
You were evaluated in the ED today for your confusion. There is not appear to be an emergent cause for your symptoms at this time.. Your exam and labs are very reassuring. Please follow-up with your neurologist for further evaluation and management of the symptoms. Return to ED for new or worsening symptoms.

## 2015-01-23 NOTE — ED Provider Notes (Signed)
CSN: [id number]     Arrival date & time 01/23/15  1344 History   First MD Initiated Contact with Patient 01/23/15 1601     Chief Complaint  Patient presents with  . Altered Mental Status     (Consider location/radiation/quality/duration/timing/severity/associated sxs/prior Treatment) HPI Tanya Jensen is a 69 y.o. female with a history of hypertension, obesity, comes in for evaluation of altered mental status. Patient, per family, has been exhibiting more forgetfulness and memory loss and signs of dementia. Patient recently seen by [facility] neurology, Dr. Lavell Anchors on 9/13 and is scheduled to have outpatient EEG as well as MRI done this week. Per neurology, patient does exhibit the signs and symptoms of dementia. Family has brought in the patient today for medical evaluation because patient left last night and was wondering around town for 12 hours. Patient denies any medical problems at this time and denies any discomfort. She is oriented to person and president, but does not know where she is or the year. Family denies any unusual symptoms at home, no fevers, chills, new cough or cold symptoms, abdominal pain, nausea/vomiting/diarrhea, urinary symptoms, numbness or weakness. No other aggravating or modifying factors. Of note, patient reports she did not take her blood pressure medicine today.  Past Medical History  Diagnosis Date  . Hypertension    Past Surgical History  Procedure Laterality Date  . Tubal ligation    . Appendectomy     Family History  Problem Relation Age of Onset  . Dementia Mother    Social History  Substance Use Topics  . Smoking status: Never Smoker   . Smokeless tobacco: None  . Alcohol Use: No   OB History    No data available     Review of Systems A 10 point review of systems was completed and was negative except for pertinent positives and negatives as mentioned in the history of present illness     Allergies  Review of patient's allergies indicates  no known allergies.  Home Medications   Prior to Admission medications   Medication Sig Start Date End Date Taking? Authorizing Treniyah Lynn  aspirin EC 81 MG tablet Take 81 mg by mouth daily.   Yes Historical Niang Mitcheltree, MD  atropine 1 % ophthalmic solution Place 1 drop into both eyes 2 (two) times daily.    Yes Historical Thalia Turkington, MD  bimatoprost (LUMIGAN) 0.01 % SOLN Place 2 drops into both eyes at bedtime.  02/22/14  Yes Historical Leniyah Martell, MD  Brinzolamide-Brimonidine 1-0.2 % SUSP Place 1 drop into the right eye 3 (three) times daily.  02/22/14  Yes Historical Aggie Douse, MD  cloNIDine (CATAPRES) 0.1 MG tablet Take 0.1 mg by mouth 2 (two) times daily.   Yes Historical Arless Vineyard, MD  Difluprednate 0.05 % EMUL 1 drop every 2 hours for a total of --8 drops in the left eye daily and 6 drops in the right eye daily 02/28/14  Yes Historical Noam Karaffa, MD  furosemide (LASIX) 20 MG tablet Take 20 mg by mouth daily.   Yes Historical Baraa Tubbs, MD  HYDROcodone-acetaminophen (NORCO/VICODIN) 5-325 MG per tablet Take 1 tablet by mouth every 6 (six) hours as needed. Patient taking differently: Take 1 tablet by mouth every 6 (six) hours as needed for moderate pain.  10/16/14  Yes Quintella Reichert, MD  ketorolac (ACULAR) 0.5 % ophthalmic solution Insert one drop 4 times a day into surgical eye starting 4 days prior to surgery 02/19/14  Yes Historical Meika Earll, MD  Linaclotide Rolan Lipa) 145 MCG CAPS capsule Take  145 mcg by mouth daily as needed (constipation).    Yes Historical Jasani Dolney, MD  lisinopril (PRINIVIL,ZESTRIL) 20 MG tablet Take 20 mg by mouth daily.   Yes Historical Lyris Hitchman, MD  timolol (TIMOPTIC) 0.5 % ophthalmic solution Place 1 drop into both eyes 3 (three) times daily.  10/24/14 10/24/15 Yes Historical Keyetta Hollingworth, MD   BP 159/89 mmHg  Pulse 90  Temp(Src) 97.7 F (36.5 C) (Oral)  Resp 22  SpO2 99% Physical Exam  Constitutional: She is oriented to person, place, and time. She appears well-developed and  well-nourished.  HENT:  Head: Normocephalic and atraumatic.  Mouth/Throat: Oropharynx is clear and moist.  Eyes: Conjunctivae are normal. Pupils are equal, round, and reactive to light. Right eye exhibits no discharge. Left eye exhibits no discharge. No scleral icterus.  Neck: Neck supple.  Cardiovascular: Normal rate, regular rhythm and normal heart sounds.   Pulmonary/Chest: Effort normal and breath sounds normal. No respiratory distress. She has no wheezes. She has no rales.  Abdominal: Soft. There is no tenderness.  Obese  Musculoskeletal: She exhibits no tenderness.  Neurological: She is alert and oriented to person, place, and time.  Cranial Nerves II-XII grossly intact. Motor strength is 5/5 in upper and lower extremities. Sensation is intact to light touch. Extraocular movements intact without nystagmus. Completes finger to nose coordination movements without difficulty. No pronator drift.  Skin: Skin is warm and dry. No rash noted.  Psychiatric: She has a normal mood and affect.  Nursing note and vitals reviewed.   ED Course  Procedures (including critical care time) Labs Review Labs Reviewed  COMPREHENSIVE METABOLIC PANEL - Abnormal; Notable for the following:    Glucose, Bld 134 (*)    All other components within normal limits  CBC - Abnormal; Notable for the following:    RDW 15.9 (*)    All other components within normal limits  URINALYSIS, ROUTINE W REFLEX MICROSCOPIC (NOT AT [facility]) - Abnormal; Notable for the following:    APPearance CLOUDY (*)    Bilirubin Urine SMALL (*)    Leukocytes, UA SMALL (*)    All other components within normal limits  URINE MICROSCOPIC-ADD ON - Abnormal; Notable for the following:    Squamous Epithelial / LPF MANY (*)    Bacteria, UA MANY (*)    Casts HYALINE CASTS (*)    All other components within normal limits  CBG MONITORING, ED - Abnormal; Notable for the following:    Glucose-Capillary 137 (*)    All other components within  normal limits    Imaging Review No results found. I have personally reviewed and evaluated these images and lab results as part of my medical decision-making.   EKG Interpretation None     Meds given in ED:  Medications - No data to display  Discharge Medication List as of 01/23/2015  6:59 PM     Filed Vitals:   01/23/15 1351 01/23/15 1752 01/23/15 1912  BP: 178/88 141/78 159/89  Pulse: 88 87 90  Temp: 98 F (36.7 C) 97.6 F (36.4 C) 97.7 F (36.5 C)  TempSrc: Oral Oral Oral  Resp: 18 20 22   SpO2: 100% 100% 99%    MDM  Vitals stable  -afebrile Pt resting comfortably in ED. PE--normal neuro exam. Physical exam is otherwise unremarkable. Labwork-labs are baseline and noncontributory. UA shows Many Bacteria with many epithelial cells Doubt UTI  Patient with newly diagnosed dementia, followed by [facility] neurology presents today for medical evaluation. Family wanted basic labs drawn for  evaluation of other possible source of patient's dementia. Labs are grossly unremarkable. Exam is reassuring. Patient is medically stable at this time with no obvious source of infection to explain new dementia. Family is amenable to discharge to follow-up with neurology for outpatient workup. I do not feel it is necessary to pursue further imaging at this time. I discussed all relevant lab findings and imaging results with pt and they verbalized understanding. Discussed f/u with PCP within 48 hrs and return precautions, pt very amenable to plan. Prior to patient discharge, I discussed and reviewed this case with Dr. Mingo Amber, who also saw and evaluated the patient.  Final diagnoses:  Confusion        Comer Locket, PA-C 01/24/15 0021  Evelina Bucy, MD 01/24/15 [phone]
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
## 2015-01-24 ENCOUNTER — Encounter (HOSPITAL_COMMUNITY): Payer: Self-pay | Admitting: Emergency Medicine

## 2015-01-24 ENCOUNTER — Emergency Department (HOSPITAL_COMMUNITY)
Admission: EM | Admit: 2015-01-24 | Discharge: 2015-01-24 | Disposition: A | Discharge status: Home / Self Care | Payer: Medicare Other | Attending: Emergency Medicine | Admitting: Emergency Medicine

## 2015-01-24 DIAGNOSIS — R4182 Altered mental status, unspecified: Secondary | ICD-10-CM | POA: Diagnosis present

## 2015-01-24 DIAGNOSIS — G3183 Dementia with Lewy bodies: Secondary | ICD-10-CM

## 2015-01-24 DIAGNOSIS — F0280 Dementia in other diseases classified elsewhere without behavioral disturbance: Secondary | ICD-10-CM | POA: Diagnosis not present

## 2015-01-24 DIAGNOSIS — I10 Essential (primary) hypertension: Secondary | ICD-10-CM | POA: Insufficient documentation

## 2015-01-24 DIAGNOSIS — R413 Other amnesia: Secondary | ICD-10-CM | POA: Diagnosis not present

## 2015-01-24 DIAGNOSIS — Z79899 Other long term (current) drug therapy: Secondary | ICD-10-CM | POA: Insufficient documentation

## 2015-01-24 DIAGNOSIS — Z7982 Long term (current) use of aspirin: Secondary | ICD-10-CM | POA: Diagnosis not present

## 2015-01-24 DIAGNOSIS — R441 Visual hallucinations: Secondary | ICD-10-CM | POA: Diagnosis not present

## 2015-01-24 LAB — T4, FREE: FREE T4: 0.97 ng/dL (ref 0.61–1.12)

## 2015-01-24 LAB — VITAMIN B12: VITAMIN B 12: 589 pg/mL (ref 180–914)

## 2015-01-24 LAB — TSH: TSH: 3.076 u[IU]/mL (ref 0.350–4.500)
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
## 2015-01-24 NOTE — ED Notes (Signed)
Neurology at bedside.
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
## 2015-01-24 NOTE — ED Notes (Signed)
Social worker at bedside.
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
## 2015-01-24 NOTE — ED Notes (Signed)
Patient, husband, daughter, and social worker all in room discussing plan of care.  Patient and husband came out of room stated they do not want plan of care discussed with daughter. Social worker walked daughter of emergency room. Doctor notified.

## 2015-01-24 NOTE — ED Notes (Signed)
Doctor at bedside discussing plan of care with patient and husband.

## 2015-01-24 NOTE — Discharge Instructions (Signed)
Please go for EEG and MRI as scheduled later this month. Return for worsening symptoms, including worsening confusion, fevers, or any other symptoms concerning to you. Please follow-up closely with your primary care right ear to see if any additional workup is necessary as an outpatient in the setting of your memory changes.

## 2015-01-25 ENCOUNTER — Ambulatory Visit
Admission: RE | Admit: 2015-01-25 | Discharge: 2015-01-25 | Disposition: A | Discharge status: Home / Self Care | Payer: Medicare Other | Source: Ambulatory Visit | Attending: Neurology | Admitting: Neurology

## 2015-01-25 DIAGNOSIS — R413 Other amnesia: Secondary | ICD-10-CM | POA: Diagnosis not present

## 2015-01-25 DIAGNOSIS — H43811 Vitreous degeneration, right eye: Secondary | ICD-10-CM | POA: Diagnosis not present

## 2015-01-25 DIAGNOSIS — H43812 Vitreous degeneration, left eye: Secondary | ICD-10-CM | POA: Diagnosis not present

## 2015-01-25 DIAGNOSIS — F22 Delusional disorders: Secondary | ICD-10-CM | POA: Diagnosis not present

## 2015-01-25 DIAGNOSIS — R5382 Chronic fatigue, unspecified: Secondary | ICD-10-CM

## 2015-01-25 DIAGNOSIS — R443 Hallucinations, unspecified: Secondary | ICD-10-CM | POA: Diagnosis not present

## 2015-01-25 DIAGNOSIS — H35352 Cystoid macular degeneration, left eye: Secondary | ICD-10-CM | POA: Diagnosis not present

## 2015-01-25 DIAGNOSIS — H2013 Chronic iridocyclitis, bilateral: Secondary | ICD-10-CM | POA: Diagnosis not present

## 2015-01-25 DIAGNOSIS — H43813 Vitreous degeneration, bilateral: Secondary | ICD-10-CM | POA: Diagnosis not present

## 2015-01-25 DIAGNOSIS — D8683 Sarcoid iridocyclitis: Secondary | ICD-10-CM | POA: Diagnosis not present

## 2015-01-25 MED ORDER — GADOBENATE DIMEGLUMINE 529 MG/ML IV SOLN
20.0000 mL | Freq: Once | INTRAVENOUS | Status: AC | PRN
  Administered 2015-01-25: 20 mL via INTRAVENOUS

## 2015-01-28 ENCOUNTER — Telehealth: Payer: Self-pay | Admitting: Neurology

## 2015-01-28 ENCOUNTER — Other Ambulatory Visit: Payer: Self-pay | Admitting: Neurology

## 2015-01-28 DIAGNOSIS — I669 Occlusion and stenosis of unspecified cerebral artery: Secondary | ICD-10-CM

## 2015-01-28 DIAGNOSIS — R9082 White matter disease, unspecified: Secondary | ICD-10-CM

## 2015-01-28 DIAGNOSIS — I480 Paroxysmal atrial fibrillation: Secondary | ICD-10-CM

## 2015-01-28 DIAGNOSIS — I776 Arteritis, unspecified: Secondary | ICD-10-CM

## 2015-01-28 NOTE — Telephone Encounter (Signed)
[phone] phone [name]. Pts husband called said that Dr. Jaynee Eagles called, was just returning call.

## 2015-01-29 ENCOUNTER — Encounter: Payer: Self-pay | Admitting: *Deleted

## 2015-01-29 NOTE — Telephone Encounter (Signed)
Spoke to husband. He is getting an EEG tonmorrow. Discussed MRi of the brain with non-specific white matter changes.

## 2015-01-29 NOTE — Telephone Encounter (Signed)
Pts husband called and would like to speak with Dr. Jaynee Eagles. She called again but did not call cell phone. Please call [phone], [name]

## 2015-01-30 ENCOUNTER — Ambulatory Visit (INDEPENDENT_AMBULATORY_CARE_PROVIDER_SITE_OTHER): Payer: Medicare Other | Admitting: Neurology

## 2015-01-30 DIAGNOSIS — R4182 Altered mental status, unspecified: Secondary | ICD-10-CM | POA: Diagnosis not present

## 2015-01-30 DIAGNOSIS — R443 Hallucinations, unspecified: Secondary | ICD-10-CM

## 2015-01-30 NOTE — Procedures (Signed)
     History: Tanya Jensen is a 69 year old patient with a history of a progressive memory disturbance. The patient has had episodes of hallucinations and confusion. The patient is being evaluated for this issue.  This is a routine EEG. No skull defects are noted. Medications include aspirin, Lumigan, clonidine, Lasix, hydrocodone, Linzess, lisinopril, and timolol.  EEG classification: Dysrhythmia grade 1 generalized  Description of the recording: The background rhythms of this recording consists of a fairly well modulated 6-7 Hz theta background activity that is reactive to eye opening and closure. As the record progresses, the patient appears to remain in the waking state throughout the recording. Photic stimulation is performed, this results in a minimal but bilateral photic driving response. Hyperventilation is also performed, and this results in a slight buildup of the background rhythm activities without significant slowing seen. The patient does not appear to have asymmetric slowing, or evidence of spike or spike-wave discharges during the study. She remains in the waking state. EKG monitor shows no evidence of cardiac rhythm abnormalities with a heart rate of 72.  Impression: This is an abnormal EEG recording secondary to diffuse generalized slowing. This is a nonspecific recording, and can be seen with any process that results in a mild metabolic or toxic encephalopathy, or any dementing illness. No epileptiform discharges were seen.

## 2015-01-31 ENCOUNTER — Other Ambulatory Visit: Payer: Medicare Other

## 2015-01-31 ENCOUNTER — Other Ambulatory Visit: Payer: Self-pay

## 2015-01-31 ENCOUNTER — Ambulatory Visit (HOSPITAL_COMMUNITY): Payer: Medicare Other | Attending: Neurology

## 2015-01-31 DIAGNOSIS — I071 Rheumatic tricuspid insufficiency: Secondary | ICD-10-CM | POA: Insufficient documentation

## 2015-01-31 DIAGNOSIS — I669 Occlusion and stenosis of unspecified cerebral artery: Secondary | ICD-10-CM | POA: Diagnosis not present

## 2015-01-31 DIAGNOSIS — R930 Abnormal findings on diagnostic imaging of skull and head, not elsewhere classified: Secondary | ICD-10-CM

## 2015-01-31 DIAGNOSIS — I351 Nonrheumatic aortic (valve) insufficiency: Secondary | ICD-10-CM | POA: Diagnosis not present

## 2015-01-31 DIAGNOSIS — I480 Paroxysmal atrial fibrillation: Secondary | ICD-10-CM | POA: Diagnosis not present

## 2015-01-31 DIAGNOSIS — I776 Arteritis, unspecified: Secondary | ICD-10-CM | POA: Diagnosis not present

## 2015-01-31 DIAGNOSIS — I371 Nonrheumatic pulmonary valve insufficiency: Secondary | ICD-10-CM | POA: Diagnosis not present

## 2015-01-31 DIAGNOSIS — R9082 White matter disease, unspecified: Secondary | ICD-10-CM

## 2015-01-31 DIAGNOSIS — I4891 Unspecified atrial fibrillation: Secondary | ICD-10-CM | POA: Insufficient documentation

## 2015-02-01 ENCOUNTER — Other Ambulatory Visit: Payer: Medicare Other

## 2015-02-01 ENCOUNTER — Other Ambulatory Visit (HOSPITAL_COMMUNITY): Payer: Medicare Other

## 2015-02-05 ENCOUNTER — Telehealth: Payer: Self-pay | Admitting: *Deleted

## 2015-02-05 NOTE — Telephone Encounter (Signed)
Unfortunately I do not think wife should be driving at all. I recommend talking away her keys. There is no evidence of sarcoid in the brain so I do not believe this is contributory. Please make sure patient has a follow up with me within the next several months and we can discuss the diagnosis further.

## 2015-02-05 NOTE — Telephone Encounter (Signed)
LVM for pt to call about results. Gave [facility] phone number and office hours.

## 2015-02-05 NOTE — Telephone Encounter (Signed)
Pt's husband called returning Emma's call.

## 2015-02-05 NOTE — Telephone Encounter (Signed)
-----   Message from Melvenia Beam, MD sent at 02/04/2015  9:46 AM EDT ----- Tanya Jensen, patient's EEG did not show epileptiform activity. However it was slow. This can be seen in dementia or other neurocognitive dysfunction. It can also be seen when people have metabolic/toxic abnormalities such as infections, UTIs, kidney failure etc. Patient should have regular primary care follow up. In the case where there are no metabolic/toxic disturbances, it does point to a dementing illness. thanks

## 2015-02-05 NOTE — Telephone Encounter (Signed)
Called husband back. Spoke to him about results. Advised per Dr. Jaynee Eagles that EEG did not show any seizure activity, but it was slower. This can be seen in dementia or other neurocognitive dysfunctions.It can also be seen with those who have metabolic/toxic abnormalities such as infections like a UTI, kidney failure, ect. Patient needs to have regular PCP follow up. In case there are no metabolic/toxic disturbances, it does point to a dementing illness. He understands and is also wondering if it is safe for his wife to drive and if sarcoidosis can play a part in dementia like symptoms. I advised Dr. Jaynee Eagles is out of the office until Thursday due to a family emergency and he is okay for a call back Thursday or Friday.   Also spoke with him about echocardiogram results. Advised per Dr. Jaynee Eagles that: "echocardiogram was overall unremarkable. We were looking for abnormalities that could cause stroke or clots to the brain, we did not find this. There is a little regurgitation from one of the valves (aortic) which does not seem to be causing any issues as well as some mild diastolic dysfunction which means the heart ventricles have some impaired relaxation but we see this commonly as we age, still they should be followed by their pcp for this. The best management is to watch high blood pressure and other vascular risk factors such as cholesterol, diabetes etc." . He verbalized understanding.   I told him I will fax results to her PCP as well. Faxed to [phone] Shirline Frees).

## 2015-02-06 DIAGNOSIS — H2013 Chronic iridocyclitis, bilateral: Secondary | ICD-10-CM | POA: Diagnosis not present

## 2015-02-06 DIAGNOSIS — Z961 Presence of intraocular lens: Secondary | ICD-10-CM | POA: Diagnosis not present

## 2015-02-06 DIAGNOSIS — H401111 Primary open-angle glaucoma, right eye, mild stage: Secondary | ICD-10-CM | POA: Diagnosis not present

## 2015-02-06 DIAGNOSIS — H2511 Age-related nuclear cataract, right eye: Secondary | ICD-10-CM | POA: Diagnosis not present

## 2015-02-06 DIAGNOSIS — H401123 Primary open-angle glaucoma, left eye, severe stage: Secondary | ICD-10-CM | POA: Diagnosis not present

## 2015-02-06 NOTE — Telephone Encounter (Signed)
LVM for husband to call back regarding Dr. Jaynee Eagles message. Gave [facility] phone number and office hours.

## 2015-02-06 NOTE — Telephone Encounter (Signed)
Husband returned call. Advised that Dr. Jaynee Eagles does not think his wife should be driving and she recommends taking away her keys to keep herself and others safe. There is no evidence of sarcoid in the brain, so she does not think this is contributing to her symptoms. Confirmed pt has f/u appt on 03/12/15 at 8am for them to discuss diagnosis further. He verbalized understanding.   He wanted to know what the specific hallucinations/delusions were called that Dr. Jaynee Eagles mentioned as to why she was seeing things that other could not see. I looked through her previous office note, and did not see any mention. Told him I will forward message to Dr. Jaynee Eagles and we will call him back.

## 2015-02-06 NOTE — Telephone Encounter (Signed)
Tanya Jensen, hallucinations and delusions can be part of dementia. I can discuss with him at his appointment in detail. If he likes, he can move up the appointment sooner to discuss. I need 30 minutes thanks

## 2015-02-08 DIAGNOSIS — D869 Sarcoidosis, unspecified: Secondary | ICD-10-CM | POA: Diagnosis not present

## 2015-02-08 DIAGNOSIS — R413 Other amnesia: Secondary | ICD-10-CM | POA: Diagnosis not present

## 2015-02-08 DIAGNOSIS — I10 Essential (primary) hypertension: Secondary | ICD-10-CM | POA: Diagnosis not present

## 2015-02-13 DIAGNOSIS — E669 Obesity, unspecified: Secondary | ICD-10-CM | POA: Diagnosis not present

## 2015-02-13 DIAGNOSIS — B259 Cytomegaloviral disease, unspecified: Secondary | ICD-10-CM | POA: Diagnosis not present

## 2015-02-13 DIAGNOSIS — H209 Unspecified iridocyclitis: Secondary | ICD-10-CM | POA: Diagnosis not present

## 2015-02-13 DIAGNOSIS — D869 Sarcoidosis, unspecified: Secondary | ICD-10-CM | POA: Diagnosis not present

## 2015-02-13 DIAGNOSIS — Z79899 Other long term (current) drug therapy: Secondary | ICD-10-CM | POA: Diagnosis not present

## 2015-02-14 NOTE — Telephone Encounter (Signed)
Spoke to husband. I recommend she not drive at this time. Discussed lewy body dementia. Thanks.

## 2015-02-20 DIAGNOSIS — Z79899 Other long term (current) drug therapy: Secondary | ICD-10-CM | POA: Diagnosis not present

## 2015-02-20 DIAGNOSIS — B259 Cytomegaloviral disease, unspecified: Secondary | ICD-10-CM | POA: Diagnosis not present

## 2015-02-26 DIAGNOSIS — H2013 Chronic iridocyclitis, bilateral: Secondary | ICD-10-CM | POA: Diagnosis not present

## 2015-02-26 DIAGNOSIS — H2011 Chronic iridocyclitis, right eye: Secondary | ICD-10-CM | POA: Diagnosis not present

## 2015-02-26 DIAGNOSIS — H2012 Chronic iridocyclitis, left eye: Secondary | ICD-10-CM | POA: Diagnosis not present

## 2015-02-26 DIAGNOSIS — D8683 Sarcoid iridocyclitis: Secondary | ICD-10-CM | POA: Diagnosis not present

## 2015-02-26 DIAGNOSIS — H35352 Cystoid macular degeneration, left eye: Secondary | ICD-10-CM | POA: Diagnosis not present

## 2015-03-12 ENCOUNTER — Ambulatory Visit (INDEPENDENT_AMBULATORY_CARE_PROVIDER_SITE_OTHER): Payer: Medicare Other | Admitting: Neurology

## 2015-03-12 ENCOUNTER — Encounter: Payer: Self-pay | Admitting: Neurology

## 2015-03-12 VITALS — BP 184/93 | HR 78 | Ht 65.0 in | Wt 285.0 lb

## 2015-03-12 DIAGNOSIS — I669 Occlusion and stenosis of unspecified cerebral artery: Secondary | ICD-10-CM | POA: Diagnosis not present

## 2015-03-12 DIAGNOSIS — G3183 Dementia with Lewy bodies: Secondary | ICD-10-CM | POA: Diagnosis not present

## 2015-03-12 DIAGNOSIS — F02818 Dementia in other diseases classified elsewhere, unspecified severity, with other behavioral disturbance: Secondary | ICD-10-CM

## 2015-03-12 DIAGNOSIS — F0281 Dementia in other diseases classified elsewhere with behavioral disturbance: Secondary | ICD-10-CM

## 2015-03-12 MED ORDER — MEMANTINE HCL 28 X 5 MG & 21 X 10 MG PO TABS: ORAL_TABLET | ORAL | Status: DC

## 2015-03-12 MED ORDER — MEMANTINE HCL-DONEPEZIL HCL ER 28-10 MG PO CP24: 1.0000 | ORAL_CAPSULE | Freq: Every day | ORAL | Status: DC
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
## 2015-03-19 ENCOUNTER — Encounter: Payer: Self-pay | Admitting: Neurology

## 2015-03-19 ENCOUNTER — Telehealth: Payer: Self-pay | Admitting: Neurology

## 2015-03-19 DIAGNOSIS — G3183 Dementia with Lewy bodies: Principal | ICD-10-CM

## 2015-03-19 DIAGNOSIS — F0281 Dementia in other diseases classified elsewhere with behavioral disturbance: Secondary | ICD-10-CM | POA: Insufficient documentation

## 2015-03-19 NOTE — Telephone Encounter (Signed)
Tanya Jensen, patient requested a letter. Can you print it out and bring it to me tomorrow so I can sign it? Thank you.

## 2015-03-19 NOTE — Telephone Encounter (Signed)
Printed out letter, will bring with me, thank you.

## 2015-03-19 NOTE — Telephone Encounter (Signed)
Tanya Jensen,  I wrote a letter for patient. Mind printing it and bringing it with you tomorrow? thanks

## 2015-03-25 NOTE — Telephone Encounter (Signed)
LVM returning husband call. Let him know I mailed letter today.

## 2015-03-25 NOTE — Telephone Encounter (Signed)
Tanya Jensen/Husband called to check status of letter

## 2015-03-25 NOTE — Telephone Encounter (Signed)
Husband returned call. Husband advised [facility] mailed letter today.

## 2015-03-26 DIAGNOSIS — D8683 Sarcoid iridocyclitis: Secondary | ICD-10-CM | POA: Diagnosis not present

## 2015-03-26 DIAGNOSIS — H35352 Cystoid macular degeneration, left eye: Secondary | ICD-10-CM | POA: Diagnosis not present

## 2015-03-26 DIAGNOSIS — H2013 Chronic iridocyclitis, bilateral: Secondary | ICD-10-CM | POA: Diagnosis not present

## 2015-04-02 NOTE — Telephone Encounter (Signed)
Touch base with her on what is going on court wise. Husband states that Dr Cathren Laine letter needs to be more specific. He had discussed w/ Dr Jaynee Eagles previously that she was in beginning stages of dementia, but the letter does not state this and he said it sounds like she is in late stages. He said for documentation purposes and for court, he would like Dr Jaynee Eagles to clarify this. I advised I will let Dr Jaynee Eagles know his requests and call him back to let him know. He verbalized understanding.

## 2015-04-02 NOTE — Telephone Encounter (Signed)
Pt's husband called regarding letter. Did not want to go detail.

## 2015-04-04 ENCOUNTER — Encounter: Payer: Self-pay | Admitting: Neurology

## 2015-04-04 NOTE — Telephone Encounter (Signed)
Dr Jaynee Eagles- when I spoke w/ them, all they wanted was for the letter to state specifically that she was in early stages of dementia and this would be sufficient.

## 2015-04-04 NOTE — Telephone Encounter (Signed)
I will write the letter and get it to them this week. Can you please get details on what he would like. We discussed during the last appointment but I want to make sure. I can put something in the letter that readys "Patien't is in the early stages of Dementia" and also document her MMSE and MoCA scores. Please verify, get as much information as you can and let me know.

## 2015-04-05 NOTE — Telephone Encounter (Signed)
Mailed revised letter.

## 2015-04-08 DIAGNOSIS — H209 Unspecified iridocyclitis: Secondary | ICD-10-CM | POA: Diagnosis not present

## 2015-04-08 DIAGNOSIS — B259 Cytomegaloviral disease, unspecified: Secondary | ICD-10-CM | POA: Diagnosis not present

## 2015-04-08 DIAGNOSIS — Z79899 Other long term (current) drug therapy: Secondary | ICD-10-CM | POA: Diagnosis not present

## 2015-04-23 DIAGNOSIS — H2011 Chronic iridocyclitis, right eye: Secondary | ICD-10-CM | POA: Diagnosis not present

## 2015-04-23 DIAGNOSIS — H35352 Cystoid macular degeneration, left eye: Secondary | ICD-10-CM | POA: Diagnosis not present

## 2015-04-23 DIAGNOSIS — H2012 Chronic iridocyclitis, left eye: Secondary | ICD-10-CM | POA: Diagnosis not present

## 2015-04-23 DIAGNOSIS — H2013 Chronic iridocyclitis, bilateral: Secondary | ICD-10-CM | POA: Diagnosis not present

## 2015-05-02 DIAGNOSIS — H401111 Primary open-angle glaucoma, right eye, mild stage: Secondary | ICD-10-CM | POA: Diagnosis not present

## 2015-05-02 DIAGNOSIS — H2013 Chronic iridocyclitis, bilateral: Secondary | ICD-10-CM | POA: Diagnosis not present

## 2015-05-02 DIAGNOSIS — H2511 Age-related nuclear cataract, right eye: Secondary | ICD-10-CM | POA: Insufficient documentation

## 2015-05-02 DIAGNOSIS — H401123 Primary open-angle glaucoma, left eye, severe stage: Secondary | ICD-10-CM | POA: Diagnosis not present

## 2015-05-07 DIAGNOSIS — H209 Unspecified iridocyclitis: Secondary | ICD-10-CM | POA: Diagnosis not present

## 2015-05-07 DIAGNOSIS — Z79899 Other long term (current) drug therapy: Secondary | ICD-10-CM | POA: Diagnosis not present

## 2015-05-17 DIAGNOSIS — Z961 Presence of intraocular lens: Secondary | ICD-10-CM | POA: Diagnosis not present

## 2015-05-17 DIAGNOSIS — H2511 Age-related nuclear cataract, right eye: Secondary | ICD-10-CM | POA: Diagnosis not present

## 2015-05-17 DIAGNOSIS — H401123 Primary open-angle glaucoma, left eye, severe stage: Secondary | ICD-10-CM | POA: Diagnosis not present

## 2015-05-17 DIAGNOSIS — H2013 Chronic iridocyclitis, bilateral: Secondary | ICD-10-CM | POA: Diagnosis not present

## 2015-05-17 DIAGNOSIS — H401111 Primary open-angle glaucoma, right eye, mild stage: Secondary | ICD-10-CM | POA: Diagnosis not present

## 2015-05-21 DIAGNOSIS — H2013 Chronic iridocyclitis, bilateral: Secondary | ICD-10-CM | POA: Diagnosis not present

## 2015-05-30 ENCOUNTER — Telehealth: Payer: Self-pay | Admitting: Neurology

## 2015-05-30 ENCOUNTER — Telehealth: Payer: Self-pay | Admitting: Student

## 2015-05-30 NOTE — Telephone Encounter (Signed)
Called James back. Ok per [employer]. Advised per Dr Jaynee Eagles to have pt stop namenda and to see if this improves her sx. He denied of pt c/o UTI sx. No burning pain/frequency/urgency. Advised to have them call PCP if after stopping namenda and still experiencing hallucinations. Scheduled f/u for 2/8 at 9am, check in 845am. No f/u was scheduled. They have a NP appt scheduled at [facility] neuro in Feb for second opinion. Needed it for a court case that is open. They have another hearing on 06/15/15 and may cancel this NP appt. Advised he needs to call them and cx appt to avoid no show charge. They are not part of our office. He verbalized understanding.

## 2015-05-30 NOTE — Telephone Encounter (Signed)
Husband Tanya Jensen called requesting to speak with Dr. Jaynee Eagles regarding memantine [facility] TITRATION PACK) tablet pack / Memantine HCl-Donepezil HCl (NAMZARIC) 28-10 MG CP24, states wife is actually doing a little worse on this medication, hallucinates, thinks husband is talking to someone when he isn't. Please call [phone].

## 2015-05-30 NOTE — Telephone Encounter (Signed)
Per Dr Jaynee Eagles- pt can stop medication and f/u w/ Dr Jaynee Eagles at next OV about this

## 2015-06-04 DIAGNOSIS — Z9842 Cataract extraction status, left eye: Secondary | ICD-10-CM | POA: Diagnosis not present

## 2015-06-04 DIAGNOSIS — Z961 Presence of intraocular lens: Secondary | ICD-10-CM | POA: Diagnosis not present

## 2015-06-04 DIAGNOSIS — H401123 Primary open-angle glaucoma, left eye, severe stage: Secondary | ICD-10-CM | POA: Diagnosis not present

## 2015-06-04 DIAGNOSIS — H401111 Primary open-angle glaucoma, right eye, mild stage: Secondary | ICD-10-CM | POA: Diagnosis not present

## 2015-06-04 DIAGNOSIS — Z7952 Long term (current) use of systemic steroids: Secondary | ICD-10-CM | POA: Diagnosis not present

## 2015-06-04 DIAGNOSIS — F0390 Unspecified dementia without behavioral disturbance: Secondary | ICD-10-CM | POA: Diagnosis not present

## 2015-06-04 DIAGNOSIS — J45909 Unspecified asthma, uncomplicated: Secondary | ICD-10-CM | POA: Diagnosis not present

## 2015-06-04 DIAGNOSIS — H2013 Chronic iridocyclitis, bilateral: Secondary | ICD-10-CM | POA: Diagnosis not present

## 2015-06-04 DIAGNOSIS — I10 Essential (primary) hypertension: Secondary | ICD-10-CM | POA: Diagnosis not present

## 2015-06-04 DIAGNOSIS — H25041 Posterior subcapsular polar age-related cataract, right eye: Secondary | ICD-10-CM | POA: Diagnosis not present

## 2015-06-04 DIAGNOSIS — H2511 Age-related nuclear cataract, right eye: Secondary | ICD-10-CM | POA: Diagnosis not present

## 2015-06-10 DIAGNOSIS — H209 Unspecified iridocyclitis: Secondary | ICD-10-CM | POA: Diagnosis not present

## 2015-06-10 DIAGNOSIS — D869 Sarcoidosis, unspecified: Secondary | ICD-10-CM | POA: Diagnosis not present

## 2015-06-10 DIAGNOSIS — Z79899 Other long term (current) drug therapy: Secondary | ICD-10-CM | POA: Diagnosis not present

## 2015-06-12 ENCOUNTER — Ambulatory Visit (INDEPENDENT_AMBULATORY_CARE_PROVIDER_SITE_OTHER): Payer: Medicare Other | Admitting: Neurology

## 2015-06-12 ENCOUNTER — Encounter: Payer: Self-pay | Admitting: Neurology

## 2015-06-12 VITALS — BP 122/69 | HR 83 | Ht 65.0 in | Wt 290.2 lb

## 2015-06-12 DIAGNOSIS — F0281 Dementia in other diseases classified elsewhere with behavioral disturbance: Secondary | ICD-10-CM

## 2015-06-12 DIAGNOSIS — G3183 Dementia with Lewy bodies: Secondary | ICD-10-CM

## 2015-06-12 MED ORDER — RIVASTIGMINE TARTRATE 1.5 MG PO CAPS: 1.5000 mg | ORAL_CAPSULE | Freq: Two times a day (BID) | ORAL | Status: DC

## 2015-06-12 NOTE — Patient Instructions (Signed)
Remember to drink plenty of fluid, eat healthy meals and do not skip any meals. Try to eat protein with a every meal and eat a healthy snack such as fruit or nuts in between meals. Try to keep a regular sleep-wake schedule and try to exercise daily, particularly in the form of walking, 20-30 minutes a day, if you can.   As far as your medications are concerned, I would like to suggest: rivastigmine 1.5mg  twice daily  I would like to see you back in 6 months, sooner if we need to. Please call us with any interim questions, concerns, problems, updates or refill requests.   Our phone number is [phone]. We also have an after hours call service for urgent matters and there is a physician on-call for urgent questions. For any emergencies you know to call 911 or go to the nearest emergency room

## 2015-06-12 NOTE — Progress Notes (Signed)
[facility]    Provider:  Dr Jaynee Eagles Referring Provider: Shirline Frees, MD Primary Care Physician:  Shirline Frees, MD  CC: Lewy body Dementia  interval update 06/12/2015:  She had side effects to donepezil. She became more confused and talking in sleep. Stopping it helped. Could not tolerate the namzeric either, more confusion. Will start Rivastigmine which is better studied in lewy body dementia. No more problems with leaving the house. She is doing well, patient and husband deny hallucinations since last being see, no swallowing difficulties, no falls, no accidents. I recommend patient not drive anymore. Husband is with patient all the time except when at work and patient is at home and doing well. She does go out a lot but she has friends come pick her up and take her out. Her appetite has been good, eatong a good diet, getting out and walking, mood is good. Everything has been going well. No falls. No issues since last being seen, husband and patient endorse all is well. She is comfortably staying at her home alone as well. No delusions, no recent hallucinations.   Interval update 03/12/2015: Wife says things are fine. No incidents since the Silver Alert in September. She left in the car and was stopped by a state trooper. She is still having visual hallucinations. She also has illusions and delusions. But not like she was having before. B12 wnl. TSH wnl.   HPI: Tanya Jensen is a 70 y.o. female here as a referral from Dr. Kenton Kingfisher for forgetfullness. PMHx HTN. She says she drives, she never gets lost. She was hallucinating. She has memory loss. She thought someone was in the home. Multiple times. She see people in church. She saw cowgirls one day. Daughter and husband are here. Daughter brought her to church and her mom hugged her and didn't remember her. Aroiund the age of 89 she started repeating things. Noticed it 8 years ago. Getting worse. Kids had a meeting with the  husband and wanted a workup completed. More recent memory than remote. She is more combative. She is changing a little bit. She has glaucoma. Vision is worsening. She has a cataract developing on one eye. She was in bed one night, she was awakened . She saw a lady on the wall. She thought the headrest in the car were people. Last Thursday she thought she saw a white female in the bathroom. She is forgetting streets she has been on in the past frequently. She calls multiple people telling them she is seeing things, says people are waiting in the car. She has called multiple people to check the house. She called someone from church 25 times. No shuffling gait or tremors. Patient's mother and grandmother had dementia, started around 64. She has lost her house and her car due to mismanagement of finances. They had to stay in a hotel when they lost everything. She is not bathing. She used to be very sharp, now she is forgetting she has rental property. Slowly progressive. She doesn't remember a lot of the incidents. She is getting lost. She should not be driving anywhere.    Reviewed notes, labs and imaging from outside physicians, which showed:  CT of the head 03/2012:personally reviewed and agree with findings below  Comparison: None.  Findings: The ventricles are normal in size, for this patient's age, and normal in configuration.  here are no parenchymal masses or mass effect. There are no areas of abnormal parenchymal attenuation. There is no evidence of  a recent infarct.  There are no extra-axial masses or abnormal fluid collections.  No intracranial hemorrhage.  Right maxillary sinus mucosal thickening. The remaining visualized sinuses and mastoid air cells are clear. No skull lesion/fracture.  IMPRESSION: No intracranial abnormality. Right maxillary sinus mucosal thickening.  TSH wnl, ACE < 14,    Social History   Social History  . Marital Status: Married    Spouse Name:  [name]  . Number of Children: 3  . Years of Education: 12   Occupational History  . Not on file.   Social History Main Topics  . Smoking status: Never Smoker   . Smokeless tobacco: Not on file  . Alcohol Use: No  . Drug Use: No  . Sexual Activity: Not on file   Other Topics Concern  . Not on file   Social History Narrative   Lives at home with husband, [name].    Family History  Problem Relation Age of Onset  . Dementia Mother     Past Medical History  Diagnosis Date  . Hypertension     Past Surgical History  Procedure Laterality Date  . Tubal ligation    . Appendectomy    . Tonsillectomy      Current Outpatient Prescriptions  Medication Sig Dispense Refill  . aspirin EC 81 MG tablet Take 81 mg by mouth daily.    Marland Kitchen atropine 1 % ophthalmic solution Place 1 drop into both eyes 2 (two) times daily.     . bimatoprost (LUMIGAN) 0.01 % SOLN Place 2 drops into both eyes at bedtime.     . Brinzolamide-Brimonidine (SIMBRINZA) 1-0.2 % SUSP Apply 1 drop to eye 3 (three) times daily.    . cloNIDine (CATAPRES) 0.1 MG tablet Take 0.1 mg by mouth 2 (two) times daily.    . folic acid (FOLVITE) 1 MG tablet Take 1 mg by mouth daily.  1  . furosemide (LASIX) 20 MG tablet Take 20 mg by mouth daily.    Marland Kitchen HYDROcodone-acetaminophen (NORCO/VICODIN) 5-325 MG per tablet Take 1 tablet by mouth every 6 (six) hours as needed. (Patient taking differently: Take 1 tablet by mouth every 6 (six) hours as needed for moderate pain. ) 6 tablet 0  . ketorolac (ACULAR) 0.5 % ophthalmic solution Insert one drop 4 times a day into surgical eye starting 4 days prior to surgery    . lisinopril (PRINIVIL,ZESTRIL) 20 MG tablet Take 20 mg by mouth daily.    . methotrexate (RHEUMATREX) 2.5 MG tablet Take 2.5 mg by mouth once a week. Every Monday 3 tab in  Morning 3 tab in the afternoon  1  . timolol (TIMOPTIC) 0.5 % ophthalmic solution Place 1 drop into both eyes 3 (three) times daily.     . rivastigmine (EXELON)  1.5 MG capsule Take 1 capsule (1.5 mg total) by mouth 2 (two) times daily. 60 capsule 12   No current facility-administered medications for this visit.    Allergies as of 06/12/2015  . (No Known Allergies)    Vitals: BP 122/69 mmHg  Pulse 83  Ht 5\' 5"  (1.651 m)  Wt 290 lb 3.2 oz (131.634 kg)  BMI 48.29 kg/m2 Last Weight:  Wt Readings from Last 1 Encounters:  06/12/15 290 lb 3.2 oz (131.634 kg)   Last Height:   Ht Readings from Last 1 Encounters:  06/12/15 5\' 5"  (1.651 m)   MMSE - Mini Mental State Exam 03/12/2015  Orientation to time 0  Orientation to Place 4  Registration 3  Attention/ Calculation 5  Recall 1  Language- name 2 objects 2  Language- repeat 1  Language- follow 3 step command 3  Language- read & follow direction 1  Write a sentence 1  Copy design 0  Total score 21   Montreal Cognitive Assessment  03/12/2015 01/15/2015  Visuospatial/ Executive (0/5) 0 1  Naming (0/3) 2 2  Attention: Read list of digits (0/2) 1 2  Attention: Read list of letters (0/1) 1 1  Attention: Serial 7 subtraction starting at 100 (0/3) 2 2  Language: Repeat phrase (0/2) 1 0  Language : Fluency (0/1) 1 1  Abstraction (0/2) 1 1  Delayed Recall (0/5) 0 0  Orientation (0/6) 2 1  Total 11 11  Adjusted Score (based on education) 12 12     Cranial Nerves:  The pupils are equal, round, and reactive to light. Visual fields are full to finger confrontation. Extraocular movements are intact. Trigeminal sensation is intact and the muscles of mastication are normal. The face is symmetric. The palate elevates in the midline. Hearing intact. Voice is normal. Shoulder shrug is normal. The tongue has normal motion without fasciculations.    Motor Observation:  No asymmetry, no atrophy, and no involuntary movements noted. Tone:  Normal muscle tone.   Posture:  Posture is normal. normal erect   Strength:  Strength is V/V in the upper and lower limbs.      Assessment/Plan: This is a 70 year old female who is here for at least 8 years of progressive memory loss. Montreal cognitive assessment test is 12 out of 30 in the office today again.MMSE 21/30.  There are behavioral symptoms as well as hallucinations and delusions. Likely Lewy-body dementia.  Will try rivastigmine Recommend no driving Discussed in depth Lewy body dementia with patient and her husband. We'll follow clinically.  Sarina Ill, MD  [facility] [address] [place], [place] [address]  Phone [phone] Fax [phone]  A total of 45 minutes was spent face-to-face with this patient. Over half this time was spent on counseling patient on the lewy body dementia diagnosis and different diagnostic and therapeutic options available.

## 2015-06-26 DIAGNOSIS — H2013 Chronic iridocyclitis, bilateral: Secondary | ICD-10-CM | POA: Diagnosis not present

## 2015-06-26 DIAGNOSIS — H35352 Cystoid macular degeneration, left eye: Secondary | ICD-10-CM | POA: Diagnosis not present

## 2015-06-28 ENCOUNTER — Ambulatory Visit: Payer: Medicare Other | Admitting: Neurology

## 2015-07-03 DIAGNOSIS — H209 Unspecified iridocyclitis: Secondary | ICD-10-CM | POA: Diagnosis not present

## 2015-07-03 DIAGNOSIS — H318 Other specified disorders of choroid: Secondary | ICD-10-CM | POA: Diagnosis not present

## 2015-07-03 DIAGNOSIS — Z79899 Other long term (current) drug therapy: Secondary | ICD-10-CM | POA: Diagnosis not present

## 2015-07-04 DIAGNOSIS — H401130 Primary open-angle glaucoma, bilateral, stage unspecified: Secondary | ICD-10-CM | POA: Diagnosis not present

## 2015-07-08 DIAGNOSIS — H401130 Primary open-angle glaucoma, bilateral, stage unspecified: Secondary | ICD-10-CM | POA: Diagnosis not present

## 2015-07-09 ENCOUNTER — Telehealth: Payer: Self-pay | Admitting: Neurology

## 2015-07-09 NOTE — Telephone Encounter (Signed)
Husband [name] called regarding trying a different medication other than rivastigmine (EXELON) 1.5 MG capsule for beginning stages of dementia.

## 2015-07-09 NOTE — Telephone Encounter (Signed)
Dr Ahern- please advise 

## 2015-07-09 NOTE — Telephone Encounter (Signed)
Can you ask them why? Is she having side effects and what are they? thanks

## 2015-07-09 NOTE — Telephone Encounter (Signed)
Called husband back. He advised medication is not helping. Advised medication is supposed to help slow the memory loss process. It does not help improve memory. He understands. Per Dr Jaynee Eagles, since he thinks pt is hallucinating again, stop medication. Made f/u apt on 07/24/15 at 830am, check in 815am per Dr Jaynee Eagles request.

## 2015-07-12 DIAGNOSIS — H401130 Primary open-angle glaucoma, bilateral, stage unspecified: Secondary | ICD-10-CM | POA: Diagnosis not present

## 2015-07-24 ENCOUNTER — Ambulatory Visit: Payer: Self-pay | Admitting: Neurology

## 2015-07-24 ENCOUNTER — Ambulatory Visit (INDEPENDENT_AMBULATORY_CARE_PROVIDER_SITE_OTHER): Payer: Medicare Other | Admitting: Adult Health

## 2015-07-24 ENCOUNTER — Encounter: Payer: Self-pay | Admitting: Adult Health

## 2015-07-24 VITALS — BP 136/78 | HR 76 | Ht 65.0 in | Wt 290.0 lb

## 2015-07-24 DIAGNOSIS — G3183 Dementia with Lewy bodies: Secondary | ICD-10-CM | POA: Diagnosis not present

## 2015-07-24 DIAGNOSIS — F0280 Dementia in other diseases classified elsewhere without behavioral disturbance: Secondary | ICD-10-CM | POA: Diagnosis not present

## 2015-07-24 NOTE — Patient Instructions (Signed)
If hallucinations or delusional thinking worsens please let us know If your symptoms worsen or you develop new symptoms please let us know.  Memory score is stable

## 2015-07-24 NOTE — Progress Notes (Addendum)
PATIENT: Tanya Jensen DOB: Sep 19, 1945                                                                                                                                                                                 REASON FOR VISIT: follow up- Ollen Barges body dementia HISTORY FROM: patient  HISTORY OF PRESENT ILLNESS Ms. Wininger is a 70 year old female with Lewy body dementia. She returns today for follow-up. The patient has tried Aricept, Namenda and Exelon but has not been able to tolerate the medication. Her husband reports that she is having hallucinations again. Her hallucinations consist of seeing people in her home. The hallucinations are not violent or fearful. She will occasionally has some delusional thinking such as feeling as if her jewelry has been stolen. She also states to me when her husband is out of  the room-she feels that he is trying to divorce her. The patient and her husband feel that her memory has remained stable. She is able to complete all ADLs independently. She is currently not operating a motor vehicle primarily due to her vision. The patient is having trouble sleeping at night and her primary care started her on trazodone 50 mg at bedtime. She is been taking this for 1 week but has not seen any benefit yet. She returns today for an evaluation.  interval update 06/12/2015: She had side effects to donepezil. She became more confused and talking in sleep. Stopping it helped. Could not tolerate the namzeric either, more confusion. Will start Rivastigmine which is better studied in lewy body dementia. No more problems with leaving the house. She is doing well, patient and husband deny hallucinations since last being see, no swallowing difficulties, no falls, no accidents. I recommend patient not drive anymore. Husband is with patient all the time except when at work and patient is at home and doing well. She does go out a lot but she has friends come pick her up and take her out.  Her appetite has been good, eatong a good diet, getting out and walking, mood is good. Everything has been going well. No falls. No issues since last being seen, husband and patient endorse all is well. She is comfortably staying at her home alone as well. No delusions, no recent hallucinations.   Interval update 03/12/2015: Wife says things are fine. No incidents since the Silver Alert in September. She left in the car and was stopped by a state trooper. She is still having visual hallucinations. She also has illusions and delusions. But not like she was having before. B12 wnl. TSH wnl.   HPI: Tanya Jensen is a 70 y.o. female here as a referral  from Dr. Kenton Kingfisher for forgetfullness. PMHx HTN. She says she drives, she never gets lost. She was hallucinating. She has memory loss. She thought someone was in the home. Multiple times. She see people in church. She saw cowgirls one day. Daughter and husband are here. Daughter brought her to church and her mom hugged her and didn't remember her. Aroiund the age of 39 she started repeating things. Noticed it 8 years ago. Getting worse. Kids had a meeting with the husband and wanted a workup completed. More recent memory than remote. She is more combative. She is changing a little bit. She has glaucoma. Vision is worsening. She has a cataract developing on one eye. She was in bed one night, she was awakened . She saw a lady on the wall. She thought the headrest in the car were people. Last Thursday she thought she saw a white female in the bathroom. She is forgetting streets she has been on in the past frequently. She calls multiple people telling them she is seeing things, says people are waiting in the car. She has called multiple people to check the house. She called someone from church 25 times. No shuffling gait or tremors. Patient's mother and grandmother had dementia, started around 39. She has lost her house and her car due to mismanagement of finances. They had to  stay in a hotel when they lost everything. She is not bathing. She used to be very sharp, now she is forgetting she has rental property. Slowly progressive. She doesn't remember a lot of the incidents. She is getting lost. She should not be driving anywhere.    Reviewed notes, labs and imaging from outside physicians, which showed:  CT of the head 03/2012:personally reviewed and agree with findings below  Comparison: None.  Findings: The ventricles are normal in size, for this patient's age, and normal in configuration.  here are no parenchymal masses or mass effect. There are no areas of abnormal parenchymal attenuation. There is no evidence of a recent infarct.  There are no extra-axial masses or abnormal fluid collections.  No intracranial hemorrhage.  Right maxillary sinus mucosal thickening. The remaining visualized sinuses and mastoid air cells are clear. No skull lesion/fracture.  IMPRESSION: No intracranial abnormality. Right maxillary sinus mucosal thickening.  TSH wnl, ACE < 14,   REVIEW OF SYSTEMS: Out of a complete 14 system review of symptoms, the patient complains only of the following symptoms, and all other reviewed systems are negative.  See HPI  ALLERGIES: No Known Allergies  HOME MEDICATIONS: Outpatient Prescriptions Prior to Visit  Medication Sig Dispense Refill  . aspirin EC 81 MG tablet Take 81 mg by mouth daily.    Marland Kitchen atropine 1 % ophthalmic solution Place 1 drop into both eyes 2 (two) times daily.     . bimatoprost (LUMIGAN) 0.01 % SOLN Place 2 drops into both eyes at bedtime.     . Brinzolamide-Brimonidine (SIMBRINZA) 1-0.2 % SUSP Apply 1 drop to eye 3 (three) times daily.    . cloNIDine (CATAPRES) 0.1 MG tablet Take 0.1 mg by mouth 2 (two) times daily.    . folic acid (FOLVITE) 1 MG tablet Take 1 mg by mouth daily.  1  . furosemide (LASIX) 20 MG tablet Take 20 mg by mouth daily.    Marland Kitchen HYDROcodone-acetaminophen (NORCO/VICODIN) 5-325 MG per  tablet Take 1 tablet by mouth every 6 (six) hours as needed. (Patient taking differently: Take 1 tablet by mouth every 6 (six) hours as needed for  moderate pain. ) 6 tablet 0  . ketorolac (ACULAR) 0.5 % ophthalmic solution Insert one drop 4 times a day into surgical eye starting 4 days prior to surgery    . lisinopril (PRINIVIL,ZESTRIL) 20 MG tablet Take 20 mg by mouth daily.    . methotrexate (RHEUMATREX) 2.5 MG tablet Take 2.5 mg by mouth once a week. Every Monday 3 tab in  Morning 3 tab in the afternoon  1  . timolol (TIMOPTIC) 0.5 % ophthalmic solution Place 1 drop into both eyes 3 (three) times daily.     . rivastigmine (EXELON) 1.5 MG capsule Take 1 capsule (1.5 mg total) by mouth 2 (two) times daily. 60 capsule 12   No facility-administered medications prior to visit.    PAST MEDICAL HISTORY: Past Medical History  Diagnosis Date  . Hypertension     PAST SURGICAL HISTORY: Past Surgical History  Procedure Laterality Date  . Tubal ligation    . Appendectomy    . Tonsillectomy      FAMILY HISTORY: Family History  Problem Relation Age of Onset  . Dementia Mother     SOCIAL HISTORY: Social History   Social History  . Marital Status: Married    Spouse Name: [name]  . Number of Children: 3  . Years of Education: 12   Occupational History  . Not on file.   Social History Main Topics  . Smoking status: Never Smoker   . Smokeless tobacco: Not on file  . Alcohol Use: No  . Drug Use: No  . Sexual Activity: Not on file   Other Topics Concern  . Not on file   Social History Narrative   Lives at home with husband, [name].      PHYSICAL EXAM  Filed Vitals:   07/24/15 0930  BP: 136/78  Pulse: 76  Height: 5\' 5"  (1.651 m)  Weight: 290 lb (131.543 kg)   Body mass index is 48.26 kg/(m^2).   MMSE - Mini Mental State Exam 07/24/2015 03/12/2015  Orientation to time 0 0  Orientation to Place 5 4  Registration 3 3  Attention/ Calculation 5 5  Recall 0 1  Language-  name 2 objects 2 2  Language- repeat 1 1  Language- follow 3 step command 3 3  Language- read & follow direction 1 1  Write a sentence 1 1  Copy design 0 0  Total score 21 21      Generalized: Well developed, in no acute distress   Neurological examination  Mentation: Alert oriented to time, place, history taking. Follows all commands speech and language fluent.  Cranial nerve II-XII: Pupils were equal round reactive to light. Extraocular movements were full, visual field were full on confrontational test. Facial sensation and strength were normal. Uvula tongue midline. Head turning and shoulder shrug  were normal and symmetric. Motor: The motor testing reveals 5 over 5 strength of all 4 extremities. Good symmetric motor tone is noted throughout.  Sensory: Sensory testing is intact to soft touch on all 4 extremities. No evidence of extinction is noted.  Coordination: Cerebellar testing reveals good finger-nose-finger and heel-to-shin bilaterally.  Gait and station: Gait is normal.  Reflexes: Deep tendon reflexes are symmetric and normal bilaterally.   DIAGNOSTIC DATA (LABS, IMAGING, TESTING) - I reviewed patient records, labs, notes, testing and imaging myself where available.  Lab Results  Component Value Date   WBC 9.5 01/23/2015   HGB 13.5 01/23/2015   HCT 40.5 01/23/2015   MCV 83.2 01/23/2015  PLT 254 01/23/2015      Component Value Date/Time   NA 141 01/23/2015 1422   NA 145* 01/15/2015 0847   K 3.5 01/23/2015 1422   CL 106 01/23/2015 1422   CO2 27 01/23/2015 1422   GLUCOSE 134* 01/23/2015 1422   GLUCOSE 117* 01/15/2015 0847   BUN 11 01/23/2015 1422   BUN 6* 01/15/2015 0847   CREATININE 0.81 01/23/2015 1422   CALCIUM 9.5 01/23/2015 1422   PROT 7.8 01/23/2015 1422   PROT 6.8 01/15/2015 0847   ALBUMIN 3.9 01/23/2015 1422   ALBUMIN 3.9 01/15/2015 0847   AST 29 01/23/2015 1422   ALT 16 01/23/2015 1422   ALKPHOS 78 01/23/2015 1422   BILITOT 0.8 01/23/2015 1422    BILITOT 0.6 01/15/2015 0847   GFRNONAA >60 01/23/2015 1422   GFRAA >60 01/23/2015 1422       ASSESSMENT AND PLAN 70 y.o. year old female  has a past medical history of Hypertension. here with:  1. Dementia- Lewy body dementia  The patient has not been able to tolerate Aricept, Namenda or Exelon. She is having hallucinations and some delusional thoughts however they are not violent or fearful. I explained that there are some risks associated with antipsychotics when taken with a diagnosis of lewy body dementia. Antipsychotics is not recommended unless psychosis is disabling. The patient states that she does not want to be on an antipsychotic. Husband agrees that at this time they would rather wait and if the hallucinations become more frequent or became violent or fearful they may reconsider. They will let us know if her symptoms worsen. They will follow-up in 6 months with Dr. Henri Medal, MSN, NP-C 07/24/2015, 9:42 AM [facility] [address], [place], [place] [address] [phone]   Personally participated in and made any corrections needed to history, physical, neuro exam,assessment and plan as stated above.  I have personally evaluated lab data, reviewed imaging studies and agree with radiology interpretations.    Sarina Ill, MD Stroke Neurology [phone] [facility]

## 2015-07-25 ENCOUNTER — Other Ambulatory Visit: Payer: Self-pay | Admitting: Neurology

## 2015-07-25 ENCOUNTER — Telehealth: Payer: Self-pay | Admitting: Neurology

## 2015-07-25 DIAGNOSIS — F03918 Unspecified dementia, unspecified severity, with other behavioral disturbance: Secondary | ICD-10-CM

## 2015-07-25 DIAGNOSIS — F0391 Unspecified dementia with behavioral disturbance: Secondary | ICD-10-CM

## 2015-07-25 MED ORDER — QUETIAPINE FUMARATE 25 MG PO TABS: 25.0000 mg | ORAL_TABLET | Freq: Two times a day (BID) | ORAL | Status: DC

## 2015-07-25 NOTE — Telephone Encounter (Signed)
Called husband back. Patient is thinking someone stole her jewelery and other stuff. She has threatened to call police. Husband will not bring her, which has made her upset and now she is getting out of the house trying to look for someone to bring her to the police. He would like to try an antipsychotic at this point. He understands the risks as previously discussed with Ward Givens, NP at last OV. Advised I will speak with Dr Jaynee Eagles and call him back to advise as soon as I can. He verbalized understanding and appreciation.

## 2015-07-25 NOTE — Telephone Encounter (Signed)
Pt's husband called requesting Terrence Dupont to return his call. He did not want to leave any details.

## 2015-07-25 NOTE — Telephone Encounter (Signed)
I will start Seroquel 25mg  bid. These medications are not FDA approved for dementia with behavioral disturbances but they are often used. However they carry a black box warning for morbidity and possible death. We should probably do an ekg 2 weeks after starting. Adverse reactions include muscle stiffness, rash, low blood pressure, seizures, heart rhythm abnormalities, low wbcs, fatigue and somnolence, dizziness, diarrhea and GI problems, blurred vision and other side effects. Stop for anything concerning and call us or proceed to ED. Will schedule EKG in 2 weeks thanks

## 2015-07-25 NOTE — Telephone Encounter (Signed)
Called husband. Relayed message below per Dr Jaynee Eagles. He knows to have pt stop medication if she experiences any SE and call our office or proceed to ED for anything concerning. Scheduled EKG with Christian in our office on 08/08/15 at 10am. Husband wrote this down. Told him to call if he has further questions/concerns. He verbalized understanding.

## 2015-07-26 DIAGNOSIS — H35352 Cystoid macular degeneration, left eye: Secondary | ICD-10-CM | POA: Diagnosis not present

## 2015-07-26 DIAGNOSIS — H2013 Chronic iridocyclitis, bilateral: Secondary | ICD-10-CM | POA: Diagnosis not present

## 2015-07-26 DIAGNOSIS — H2012 Chronic iridocyclitis, left eye: Secondary | ICD-10-CM | POA: Diagnosis not present

## 2015-07-26 DIAGNOSIS — H2011 Chronic iridocyclitis, right eye: Secondary | ICD-10-CM | POA: Diagnosis not present

## 2015-07-31 DIAGNOSIS — Z9841 Cataract extraction status, right eye: Secondary | ICD-10-CM | POA: Diagnosis not present

## 2015-07-31 DIAGNOSIS — H401111 Primary open-angle glaucoma, right eye, mild stage: Secondary | ICD-10-CM | POA: Diagnosis not present

## 2015-07-31 DIAGNOSIS — Z961 Presence of intraocular lens: Secondary | ICD-10-CM | POA: Diagnosis not present

## 2015-07-31 DIAGNOSIS — H401123 Primary open-angle glaucoma, left eye, severe stage: Secondary | ICD-10-CM | POA: Diagnosis not present

## 2015-07-31 DIAGNOSIS — H2013 Chronic iridocyclitis, bilateral: Secondary | ICD-10-CM | POA: Diagnosis not present

## 2015-07-31 NOTE — Telephone Encounter (Signed)
Error

## 2015-08-08 ENCOUNTER — Ambulatory Visit (INDEPENDENT_AMBULATORY_CARE_PROVIDER_SITE_OTHER): Payer: Self-pay | Admitting: Neurology

## 2015-08-08 DIAGNOSIS — D509 Iron deficiency anemia, unspecified: Secondary | ICD-10-CM | POA: Diagnosis not present

## 2015-08-08 DIAGNOSIS — Z79899 Other long term (current) drug therapy: Secondary | ICD-10-CM

## 2015-08-08 DIAGNOSIS — H209 Unspecified iridocyclitis: Secondary | ICD-10-CM | POA: Diagnosis not present

## 2015-08-22 ENCOUNTER — Telehealth: Payer: Self-pay | Admitting: Neurology

## 2015-08-22 ENCOUNTER — Other Ambulatory Visit: Payer: Self-pay | Admitting: Neurology

## 2015-08-22 DIAGNOSIS — F0391 Unspecified dementia with behavioral disturbance: Secondary | ICD-10-CM

## 2015-08-22 DIAGNOSIS — F03918 Unspecified dementia, unspecified severity, with other behavioral disturbance: Secondary | ICD-10-CM

## 2015-08-22 MED ORDER — QUETIAPINE FUMARATE 25 MG PO TABS: 50.0000 mg | ORAL_TABLET | Freq: Two times a day (BID) | ORAL | Status: DC

## 2015-08-22 NOTE — Telephone Encounter (Signed)
I increased to 50mg  seroquel bid thanks

## 2015-08-22 NOTE — Telephone Encounter (Signed)
Spouse called to advise, "hallucinations have gotten worse".

## 2015-08-22 NOTE — Telephone Encounter (Signed)
Called husband back. He states she is having more hallucinations. He states she is thinking someone is in the house and car. She looks at headrest thinking it is a person. Per husband, she called daughter at 130am today and wanted to know how she was feeling and thought she was not feeling well. He stated she is taking seroquel 2 times daily, once in morning and once in evening. Per Dr Jaynee Eagles, we can try to increase medication. He is in agreement. He stated she is tolerating the medication okay. Advised Dr Jaynee Eagles will call and discuss. She will call latest by tomorrow. He verbalized understanding.

## 2015-08-26 ENCOUNTER — Other Ambulatory Visit: Payer: Self-pay | Admitting: Neurology

## 2015-08-26 MED ORDER — QUETIAPINE FUMARATE 50 MG PO TABS: ORAL_TABLET | ORAL | Status: DC

## 2015-08-26 NOTE — Addendum Note (Signed)
Addended by: Margette Fast on: 08/26/2015 06:48 PM   Modules accepted: Orders, Medications

## 2015-08-26 NOTE — Telephone Encounter (Signed)
I called the patient, talk with the husband. The patient remains somewhat confused, hallucinating at times. The hallucinations are often times worse in the morning. The use of Seroquel so far has not been helpful at 50 mg twice daily. I will go 200 mg at night, 50 mg in the morning. If this is not effective, may consider the use of Nuplazid for the hallucinations.

## 2015-08-26 NOTE — Telephone Encounter (Addendum)
Pt's husband called and did not hear from Dr. Jaynee Eagles as promised. I asked if pt was taking the increased dosage of Seroquel and he said, "no , he did not because he did not hear from the office. " He also states she is still having hallucinations. She woke around 12:45 am and has been up since. He is requesting a call back to discuss. May call [phone]

## 2015-08-30 NOTE — Telephone Encounter (Signed)
Emma: Please advise husband that I do not recommend any further increase in the Seroquel. he may have to look into hiring a sitter for nighttime supervision. Unfortunately, hallucinations, fluctuate, sometimes simple things as better hydration can help as well. I will route to Dr. Jaynee Eagles to review when she is back on Monday.

## 2015-08-30 NOTE — Telephone Encounter (Signed)
Called husband back. Relayed Dr Rexene Alberts message below. Offered to give him information on [employer] here is [place] who can come and be with his wife while he cannot be there. He states he already has a resource to turn to. He declines at this time. He has not picked up rx written 4/26 for seroquel written by Dr Jannifer Franklin yet. He was advised by Dr Jannifer Franklin to finish up the tablets she has and then start new rx. He is to have her take 2 tablets at night and one tablet in the morning per new rx. (50 mg tablets). Old rx seroquel is for 25mg  tabs. To equal dose Dr Jannifer Franklin advised, she will have to take 4 tablets at night and 2 tablets in the morning. He verbalized understanding.

## 2015-08-30 NOTE — Telephone Encounter (Signed)
Pt's husband called said she has done well at night since increasing the Seroquel at nighttime but today he received a phone call at work from a neighbor that the pt was walking down the street. He is on his way home now. He sts she has not stopped thinking that someone else is in the home. Please call [phone]

## 2015-08-31 NOTE — Progress Notes (Signed)
EKG performed for QT evaluation. HR 72 QTc 409.

## 2015-08-31 NOTE — Telephone Encounter (Signed)
Please let patient know that I advise he see Dr. Norma Fredrickson who is a geriatric psychiatrist. He is an expert at managing psychosis in dementia. Please provide Dr. Karen Chafe private office number (not the [facility] health office as this is very backed up). I suggest they have an appointment and see Dr. Casimiro Needle who is an expert in geriatric psychiatry and dementia behavioral disturbances. If they like we can facilitate the appointment. In the meantime he should go up on the Seroquel as Dr. Jannifer Franklin recommended. We could schedule a follow up in the office with Jinny Blossom this month if they needed to come into the office thanks.

## 2015-09-02 NOTE — Telephone Encounter (Signed)
Private office information: [place], [place], [place], [place] [address]  [phone] - [phone]

## 2015-09-02 NOTE — Telephone Encounter (Addendum)
Called and spoke to husband, Tanya Jensen. Relayed Dr Jaynee Eagles message below. He stated that she has increased seroquel per Dr Jannifer Franklin recommendation and she is doing well on the dosage increase. He was driving in the car at the time. He wanted me to call his number back and LVM with information RE Dr Casimiro Needle. He will call to make appt.   Called husband's number back. LVM regarding Dr Casimiro Needle phone number and address as requested. Advised him to call if he has any further questions.

## 2015-09-09 ENCOUNTER — Telehealth: Payer: Self-pay | Admitting: *Deleted

## 2015-09-09 NOTE — Telephone Encounter (Signed)
LVM returning [name] (husband) phone call. Advised I wanted to know what medication he was referring to and what his questions was. Asked him to call office back.

## 2015-09-09 NOTE — Telephone Encounter (Signed)
Pt husband has a question about wife medication pt husband did not give name of medication . Please advised [phone]

## 2015-09-12 ENCOUNTER — Ambulatory Visit: Payer: Medicare Other | Admitting: Neurology

## 2015-09-12 NOTE — Telephone Encounter (Signed)
Called and spoke to [place]. Relayed Dr Jaynee Eagles message below. He states they were able to get an appt with Dr Casimiro Needle on 6/15 or 6/20. He could not remember the exact date and he was driving so he could not look. Relayed there is a medication called  Nuplazid that Dr Jaynee Eagles suggests for psychosis. I would have to have them sign a HIPAA form for me to be able to send her health information. He said to fax form to [phone]. Told him to fax it back to [phone]. He was driving and wanted me to include on the fax where to fax it to since he could not write it down. Told him we will call him back if we have any more questions. He can call if he has any other questions/concerns. He verbalized understanding.   Faxed [employer] form for nuplazid to requested fax number above to husband. Received confirmation. Awaiting for signed copy back.

## 2015-09-12 NOTE — Telephone Encounter (Signed)
She already tried Aricept and had side effects. I suggest she go see Dr. Casimiro Needle who is a specialist in Geriatric psychiatry and who can help manage the hallucinations and delusions (psychosis). I think I recommended that to them before. We can also try Nuplazid which is a new medication for psychosis.

## 2015-09-12 NOTE — Telephone Encounter (Signed)
Dr Jaynee Eagles- please advise  Called husband back. He states that she took the seroquel this past Sunday morning and was groggy. She did not take it again until Monday morning. She was doing okay before taking it again. After taking it that morning, she got afraid and thought daughter was going to leave her per husband. She did not take it Monday evening, Tuesday, or Wed morning. Husband gave her the seroquel Wednesday night and she woke up Thursday morning thinking someone was in the house and yelling for people to get out per husband. He has not given her any more of the medication because he thinks she does better without it. He is concerned. He is wondering is she could try Aricept instead. He was talking to someone about this medication and was interested in what Dr Jaynee Eagles thought. I advised Dr Jaynee Eagles is still seeing pt for today and I will speak with her. I will call back to advise on what she says. He verbalized understanding.

## 2015-09-12 NOTE — Telephone Encounter (Signed)
Dr Ahern- FYI 

## 2015-09-12 NOTE — Telephone Encounter (Signed)
error 

## 2015-09-13 DIAGNOSIS — H04123 Dry eye syndrome of bilateral lacrimal glands: Secondary | ICD-10-CM | POA: Diagnosis not present

## 2015-09-13 DIAGNOSIS — H20013 Primary iridocyclitis, bilateral: Secondary | ICD-10-CM | POA: Diagnosis not present

## 2015-09-13 DIAGNOSIS — H401131 Primary open-angle glaucoma, bilateral, mild stage: Secondary | ICD-10-CM | POA: Diagnosis not present

## 2015-09-18 DIAGNOSIS — D509 Iron deficiency anemia, unspecified: Secondary | ICD-10-CM | POA: Diagnosis not present

## 2015-09-18 DIAGNOSIS — H35353 Cystoid macular degeneration, bilateral: Secondary | ICD-10-CM | POA: Diagnosis not present

## 2015-09-18 DIAGNOSIS — D8683 Sarcoid iridocyclitis: Secondary | ICD-10-CM | POA: Diagnosis not present

## 2015-09-18 DIAGNOSIS — H209 Unspecified iridocyclitis: Secondary | ICD-10-CM | POA: Diagnosis not present

## 2015-09-18 DIAGNOSIS — Z79899 Other long term (current) drug therapy: Secondary | ICD-10-CM | POA: Diagnosis not present

## 2015-09-18 DIAGNOSIS — H2013 Chronic iridocyclitis, bilateral: Secondary | ICD-10-CM | POA: Diagnosis not present

## 2015-09-19 DIAGNOSIS — R600 Localized edema: Secondary | ICD-10-CM | POA: Diagnosis not present

## 2015-09-20 ENCOUNTER — Other Ambulatory Visit: Payer: Self-pay | Admitting: Neurology

## 2015-09-20 DIAGNOSIS — F0392 Unspecified dementia, unspecified severity, with psychotic disturbance: Secondary | ICD-10-CM

## 2015-09-20 DIAGNOSIS — F0391 Unspecified dementia with behavioral disturbance: Secondary | ICD-10-CM

## 2015-09-20 MED ORDER — QUETIAPINE FUMARATE 50 MG PO TABS: 100.0000 mg | ORAL_TABLET | Freq: Two times a day (BID) | ORAL | Status: DC

## 2015-09-20 NOTE — Telephone Encounter (Signed)
I spoke to husband. He needs to continue the Seroquel for 2-4 weeks after starting the Nuplazid. He is going to see Dr. Casimiro Needle in 3 weeks so I told him to stay on both until then (he has not gottent he Nuplazid yet) thanks.

## 2015-09-20 NOTE — Telephone Encounter (Signed)
LM that Tanya Jensen did receive forms back for Nuplazid and she is sending it off. I stated that the drug company will call him soon about copay etc. I advised that if they cannot afford it, to ask for financial assistance.

## 2015-09-20 NOTE — Telephone Encounter (Signed)
Pt's husband called said he faxed the [employer] form back this morning. Also he sts pt has 1 pill of seroquel left. He did not want to get it refilled if Dr Jaynee Eagles is going to change pt's medication. Please call today. Please call [phone]

## 2015-09-20 NOTE — Telephone Encounter (Signed)
Dr. Jaynee Eagles,  How would you like the patient to proceed with Seroquel?

## 2015-09-23 ENCOUNTER — Encounter: Payer: Self-pay | Admitting: *Deleted

## 2015-09-23 NOTE — Progress Notes (Signed)
Faxed treatment form for Nuplazid to [facility] at [phone]. Received fax confirmation.

## 2015-09-24 ENCOUNTER — Telehealth: Payer: Self-pay | Admitting: Neurology

## 2015-09-24 NOTE — Telephone Encounter (Signed)
Dr Ahern- please advise 

## 2015-09-24 NOTE — Telephone Encounter (Signed)
Pt's husband called sts seroquel is not working. Pt is saying he is not her husband and is trying to get out of the house. Please call

## 2015-09-25 ENCOUNTER — Other Ambulatory Visit: Payer: Self-pay | Admitting: Neurology

## 2015-09-25 DIAGNOSIS — H35353 Cystoid macular degeneration, bilateral: Secondary | ICD-10-CM | POA: Diagnosis not present

## 2015-09-25 DIAGNOSIS — H2013 Chronic iridocyclitis, bilateral: Secondary | ICD-10-CM | POA: Diagnosis not present

## 2015-09-25 MED ORDER — RISPERIDONE 1 MG PO TABS: ORAL_TABLET | ORAL | Status: DC

## 2015-09-25 NOTE — Telephone Encounter (Signed)
Stop seroquel. Start Risperdal 1/2 tablet twice daily then increase to a whole tablet twice daily in one week if needed. We start low and increase as needed, call in 2 weeks if an increase is needed. thanks

## 2015-09-25 NOTE — Telephone Encounter (Signed)
I tried calling. We can try another medication, Risperdal. Please try calling patient's husband and see if this works for him and I can order it. Risperdal has the same side effects and warnings as Seroquel did, there is risk of falls, sedation, and serious side effects including morbidity and mortality. We often use these medications in dementia with psychosis but they are not FDA approved. I am happy to try another medication like Risperdal which is also an anti-psychotic. They have an upcoming appoitment with Dr. Casimiro Needle as well. thanks

## 2015-09-25 NOTE — Telephone Encounter (Signed)
Spoke to pt's husband and advised him that should stop taking seroquel and start risperdal 1/2 tablet by mouth twice daily, and then increase to a whole tablet twice daily in one week if needed. Pt's husband knows to call us in 2 weeks if an increase is needed. Pt's husband verbalized understanding and appreciation.

## 2015-09-25 NOTE — Telephone Encounter (Signed)
I spoke to pt's husband [name]) per [facility]. I advised him that Dr. Jaynee Eagles can try risperdal for the pt. I advised him that risperdal has the same side effects and warnings as seroquel did- risk of falls, sedation, and serious side effects including morbidity and mortality. These medications can help pt's with dementia with psychosis but they are not FDA approved. Pt's husband says they will keep the appt with Dr. Casimiro Needle.  Pt's husband says they will try risperdal if Dr. Jaynee Eagles thinks it will help the pt with her hallucinations. He wants to know if pt should stop taking the seroquel while taking the risperdal? I advised him that our office will get back to him. Pt's husband verbalized understanding.

## 2015-09-26 ENCOUNTER — Telehealth: Payer: Self-pay | Admitting: Neurology

## 2015-09-26 NOTE — Telephone Encounter (Signed)
Called [place] back. He stated diagnosis box was not checked/filled out on form. I advised I will complete this section and re-fax. He verbalized understanding.

## 2015-09-26 NOTE — Telephone Encounter (Signed)
Re-faxed form with completed diagnosis section as requested. Received fax confirmation.

## 2015-09-26 NOTE — Telephone Encounter (Signed)
Tanya Jensen with [facility] called needing more information on diagnosis  on treatment form  . Please call  [phone]

## 2015-10-02 NOTE — Telephone Encounter (Signed)
error 

## 2015-10-04 ENCOUNTER — Telehealth: Payer: Self-pay | Admitting: Neurology

## 2015-10-04 DIAGNOSIS — H2013 Chronic iridocyclitis, bilateral: Secondary | ICD-10-CM | POA: Diagnosis not present

## 2015-10-04 DIAGNOSIS — H401130 Primary open-angle glaucoma, bilateral, stage unspecified: Secondary | ICD-10-CM | POA: Diagnosis not present

## 2015-10-04 NOTE — Telephone Encounter (Signed)
Spouse called to ask if wife should continue taking risperiDONE (RISPERDAL) 1 MG tablet or take the medication that just came in the mail NUPLAZID? Please call to advise.

## 2015-10-04 NOTE — Telephone Encounter (Signed)
I spoke to husband, he is really happy with the risperdal. I told him to hold off on the nuplazid then and he sees Dr. Casimiro Needle this month, he can discuss with him. thanks

## 2015-10-10 ENCOUNTER — Other Ambulatory Visit: Payer: Self-pay | Admitting: Neurology

## 2015-10-10 MED ORDER — RISPERIDONE 1 MG PO TABS: 1.5000 mg | ORAL_TABLET | Freq: Two times a day (BID) | ORAL | Status: DC

## 2015-10-10 NOTE — Telephone Encounter (Signed)
Husband [name] called requesting to speak with Dr. Jaynee Eagles regarding risperiDONE (RISPERDAL) 1 MG tablet, states "not working quite like it should work". Please call [phone].

## 2015-10-10 NOTE — Telephone Encounter (Signed)
Medicine is helping, no side effects, she is still having a lot of hallucinations and delusion. Increase to 1.5mg  in the morning and 1mg  in the evenings for 1 week then increase to 1.5mg  twice daily. Husband understands, thanks

## 2015-10-14 ENCOUNTER — Other Ambulatory Visit: Payer: Self-pay | Admitting: Neurology

## 2015-10-14 MED ORDER — RISPERIDONE 1 MG PO TABS: 1.5000 mg | ORAL_TABLET | Freq: Two times a day (BID) | ORAL | Status: DC

## 2015-10-16 DIAGNOSIS — Z79899 Other long term (current) drug therapy: Secondary | ICD-10-CM | POA: Diagnosis not present

## 2015-10-16 DIAGNOSIS — M109 Gout, unspecified: Secondary | ICD-10-CM | POA: Diagnosis not present

## 2015-10-16 DIAGNOSIS — D869 Sarcoidosis, unspecified: Secondary | ICD-10-CM | POA: Diagnosis not present

## 2015-10-16 DIAGNOSIS — H209 Unspecified iridocyclitis: Secondary | ICD-10-CM | POA: Diagnosis not present

## 2015-10-16 DIAGNOSIS — M25475 Effusion, left foot: Secondary | ICD-10-CM | POA: Diagnosis not present

## 2015-10-16 NOTE — Telephone Encounter (Signed)
[facility] [phone] called to advise, they have been unable to reach patient, this may delay shipment of Nuplazid.

## 2015-10-16 NOTE — Telephone Encounter (Signed)
Called and spoke to husband, [name]. He stated he already spoke to [place] from [place] last week and they already have the medication. He stated Dr Jaynee Eagles spoke to him last week and wanted them to hold off on taking Nuplazid and increased her risperdal. I went over instructions per Dr Jaynee Eagles below and advised rx sent to pharmacy. He verbalized understanding.   He also stated he lost appt with Dr. Casimiro Needle because he could not afford to pay the deposit at the time which is 150. He is calling tomorrow to get rescheduled  He states she is still having hallucinations, but that is it. Doing well per husband. Advised I am going to call [facility] back because they called our office today stating they could not reach him. He knows I will call back if I have further questions.

## 2015-10-16 NOTE — Telephone Encounter (Signed)
Called [facility]. Had to LVM for rep to call back. Gave [facility] phone number.

## 2015-10-17 NOTE — Telephone Encounter (Addendum)
Dr Jaynee Eagles- [facility] Spoke to [place] from [place]. Advised him pt has medication. I spoke to husband. They were advised on holding off on starting medication by Dr Jaynee Eagles right now. He verbalized understanding and will make a note in her chart.

## 2015-10-22 NOTE — Telephone Encounter (Signed)
[facility] called to f/u on this pt. Please call back at [phone]

## 2015-10-22 NOTE — Telephone Encounter (Signed)
Dr Jaynee Eagles- [facility] back. He stated that they did not triage prescription to pt at all. They have not been able to reach pt/husband since 10-08-15. He thinks they received the 14-day sample, and were thinking the was the actual prescription. Husband stated he would call back and a couple nurse educators tried to call as well. Advised Tommi Rumps that pt is holding off on starting medication. He said if Dr Jaynee Eagles decides to have her start medication, have husband call [phone] to go over rx coverage information.

## 2015-10-29 ENCOUNTER — Telehealth: Payer: Self-pay | Admitting: Neurology

## 2015-10-29 ENCOUNTER — Other Ambulatory Visit: Payer: Self-pay | Admitting: Neurology

## 2015-10-29 MED ORDER — RISPERIDONE 2 MG PO TABS: 2.0000 mg | ORAL_TABLET | Freq: Two times a day (BID) | ORAL | Status: DC

## 2015-10-29 NOTE — Telephone Encounter (Signed)
Patient's husband is calling and states his wife is hostile and he is asking if he can increase her Rx risperidone 1 mg.  Please call.

## 2015-10-29 NOTE — Telephone Encounter (Signed)
Dr.Ahern left message for husband to call back.

## 2015-10-29 NOTE — Telephone Encounter (Signed)
Pt's husband requesting a call back as soon as possible. I asked him what message I could take , he whispered and said that he could not speak right now. Please call and advise

## 2015-10-30 DIAGNOSIS — H35352 Cystoid macular degeneration, left eye: Secondary | ICD-10-CM | POA: Diagnosis not present

## 2015-10-30 DIAGNOSIS — H2013 Chronic iridocyclitis, bilateral: Secondary | ICD-10-CM | POA: Diagnosis not present

## 2015-10-30 DIAGNOSIS — H35353 Cystoid macular degeneration, bilateral: Secondary | ICD-10-CM | POA: Diagnosis not present

## 2015-10-30 DIAGNOSIS — D8683 Sarcoid iridocyclitis: Secondary | ICD-10-CM | POA: Diagnosis not present

## 2015-10-30 DIAGNOSIS — H35351 Cystoid macular degeneration, right eye: Secondary | ICD-10-CM | POA: Diagnosis not present

## 2015-10-30 NOTE — Telephone Encounter (Signed)
Increased Risperdal from 1.5mg  twice daily to 2mg  twice daily. Discussed again that this medication is not FDA approved for management of psychosis and dementia however to be due to use it off label when other options do not work. Serious reactions can include hypotension, syncope, extraparametal symptoms, tardive dyskinesia, and MS, hyperglycemia, diabetes, seizures, stroke, dysphasia and other serious reactions. There is a black box warning for the elderly that it can cause risk of morbidity or mortality. Common reactions can include somnolence, fatigue, insomnia, nausea, cough, constipation, fever, extraparametal symptoms, dystonia, anxiety, dizziness tremors, acathisia and others. Husband acknowledged and still would like an increase in her medications. If she starts having side effects and want him to call us immediately go back to the previous dose.

## 2015-11-11 DIAGNOSIS — G3183 Dementia with Lewy bodies: Secondary | ICD-10-CM | POA: Diagnosis not present

## 2015-11-12 ENCOUNTER — Telehealth: Payer: Self-pay | Admitting: *Deleted

## 2015-11-12 NOTE — Telephone Encounter (Signed)
Pt notes faxed to Dr Casimiro Needle on 11/12/2015.

## 2015-11-25 ENCOUNTER — Ambulatory Visit: Payer: Medicare Other | Admitting: Neurology

## 2015-11-27 ENCOUNTER — Encounter: Payer: Self-pay | Admitting: Neurology

## 2015-11-27 ENCOUNTER — Ambulatory Visit (INDEPENDENT_AMBULATORY_CARE_PROVIDER_SITE_OTHER): Payer: Medicare Other | Admitting: Neurology

## 2015-11-27 VITALS — BP 118/80 | HR 78 | Ht 65.0 in | Wt 274.4 lb

## 2015-11-27 DIAGNOSIS — G3183 Dementia with Lewy bodies: Secondary | ICD-10-CM | POA: Diagnosis not present

## 2015-11-27 DIAGNOSIS — F0280 Dementia in other diseases classified elsewhere without behavioral disturbance: Secondary | ICD-10-CM

## 2015-11-27 NOTE — Progress Notes (Signed)
[facility]    Provider:  Dr Jaynee Eagles Referring Provider: Shirline Frees, MD Primary Care Physician:  Shirline Frees, MD  REASON FOR VISIT: follow upOllen Jensen body dementia HISTORY FROM: patient  Interval update 11/27/2015: She is doing very well. She is on Risperdal 2mg  twice daily and we discussed the risks associated with these medications. He has decreased to 1 pill in the morning 2mg  and discussed trying to limit as much as possible. He may give her 1/2 a pill in the afternoon if needed. She is not having any side effects to the Rispersdal. It has helped with the delusions and hallucinations. She has not left the house recently. No falls. She has some back pain and she is on a muscle relaxer. Sleeping well, no issues at night. She takes 1/2 a trazodone at night. Husband and wife is happy. There is a book, 36-hour day. She feels like she is doing very well. Mother and grandmother had dementia. Discussed Lewy body dementia. She was a Theme park manager, Tanya Jensen.   HISTORY OF PRESENT ILLNESS Ms. Fazzone is a 70 year old female with Lewy body dementia. She returns today for follow-up. The patient has tried Aricept, Namenda and Exelon but has not been able to tolerate the medication. Her husband reports that she is having hallucinations again. Her hallucinations consist of seeing people in her home. The hallucinations are not violent or fearful. She will occasionally has some delusional thinking such as feeling as if her jewelry has been stolen. She also states to me when her husband is out of  the room-she feels that he is trying to divorce her. The patient and her husband feel that her memory has remained stable. She is able to complete all ADLs independently. She is currently not operating a motor vehicle primarily due to her vision. The patient is having trouble sleeping at night and her primary care started her on trazodone 50 mg at bedtime. She is been taking this for 1 week but  has not seen any benefit yet. She returns today for an evaluation.  interval update 06/12/2015: She had side effects to donepezil. She became more confused and talking in sleep. Stopping it helped. Could not tolerate the namzeric either, more confusion. Will start Rivastigmine which is better studied in lewy body dementia. No more problems with leaving the house. She is doing well, patient and husband deny hallucinations since last being see, no swallowing difficulties, no falls, no accidents. I recommend patient not drive anymore. Husband is with patient all the time except when at work and patient is at home and doing well. She does go out a lot but she has friends come pick her up and take her out. Her appetite has been good, eatong a good diet, getting out and walking, mood is good. Everything has been going well. No falls. No issues since last being seen, husband and patient endorse all is well. She is comfortably staying at her home alone as well. No delusions, no recent hallucinations.   Interval update 03/12/2015: Wife says things are fine. No incidents since the Silver Alert in September. She left in the car and was stopped by a state trooper. She is still having visual hallucinations. She also has illusions and delusions. But not like she was having before. B12 wnl. TSH wnl.   HPI: Tanya Jensen is a 70 y.o. female here as a referral from Dr. Kenton Kingfisher for forgetfullness. PMHx HTN. She says she drives, she never gets lost. She was  hallucinating. She has memory loss. She thought someone was in the home. Multiple times. She see people in church. She saw cowgirls one day. Daughter and husband are here. Daughter brought her to church and her mom hugged her and didn't remember her. Aroiund the age of 49 she started repeating things. Noticed it 8 years ago. Getting worse. Kids had a meeting with the husband and wanted a workup completed. More recent memory than remote. She is more combative. She is  changing a little bit. She has glaucoma. Vision is worsening. She has a cataract developing on one eye. She was in bed one night, she was awakened . She saw a lady on the wall. She thought the headrest in the car were people. Last Thursday she thought she saw a white female in the bathroom. She is forgetting streets she has been on in the past frequently. She calls multiple people telling them she is seeing things, says people are waiting in the car. She has called multiple people to check the house. She called someone from church 25 times. No shuffling gait or tremors. Patient's mother and grandmother had dementia, started around 37. She has lost her house and her car due to mismanagement of finances. They had to stay in a hotel when they lost everything. She is not bathing. She used to be very sharp, now she is forgetting she has rental property. Slowly progressive. She doesn't remember a lot of the incidents. She is getting lost. She should not be driving anywhere.    Reviewed notes, labs and imaging from outside physicians, which showed:  CT of the head 03/2012:personally reviewed and agree with findings below  Comparison: None.  Findings: The ventricles are normal in size, for this patient's age, and normal in configuration.  here are no parenchymal masses or mass effect. There are no areas of abnormal parenchymal attenuation. There is no evidence of a recent infarct.  There are no extra-axial masses or abnormal fluid collections.  No intracranial hemorrhage.  Right maxillary sinus mucosal thickening. The remaining visualized sinuses and mastoid air cells are clear. No skull lesion/fracture.  IMPRESSION: No intracranial abnormality. Right maxillary sinus mucosal thickening.  TSH wnl, ACE < 14,   REVIEW OF SYSTEMS: Out of a complete 14 system review of symptoms, the patient complains only of the following symptoms, and all other reviewed systems are negative.  See  HPI  ALLERGIES: No Known Allergies   Social History   Social History  . Marital status: Married    Spouse name: [name]  . Number of children: 3  . Years of education: 12   Occupational History  . Not on file.   Social History Main Topics  . Smoking status: Never Smoker  . Smokeless tobacco: Never Used  . Alcohol use No  . Drug use: No  . Sexual activity: Not on file   Other Topics Concern  . Not on file   Social History Narrative   Lives at home with husband, [name].    Family History  Problem Relation Age of Onset  . Dementia Mother     Past Medical History:  Diagnosis Date  . Hypertension     Past Surgical History:  Procedure Laterality Date  . APPENDECTOMY    . TONSILLECTOMY    . TUBAL LIGATION      Current Outpatient Prescriptions  Medication Sig Dispense Refill  . aspirin EC 81 MG tablet Take 81 mg by mouth daily.    Marland Kitchen atropine 1 %  ophthalmic solution Place 1 drop into both eyes 2 (two) times daily.     . bimatoprost (LUMIGAN) 0.01 % SOLN Place 2 drops into both eyes at bedtime.     . Brinzolamide-Brimonidine (SIMBRINZA) 1-0.2 % SUSP Apply 1 drop to eye 3 (three) times daily.    . cloNIDine (CATAPRES) 0.1 MG tablet Take 0.1 mg by mouth 2 (two) times daily.    . folic acid (FOLVITE) 1 MG tablet Take 1 mg by mouth daily.  1  . furosemide (LASIX) 20 MG tablet Take 20 mg by mouth daily.    Marland Kitchen HYDROcodone-acetaminophen (NORCO/VICODIN) 5-325 MG per tablet Take 1 tablet by mouth every 6 (six) hours as needed. (Patient taking differently: Take 1 tablet by mouth every 6 (six) hours as needed for moderate pain. ) 6 tablet 0  . ketorolac (ACULAR) 0.5 % ophthalmic solution Insert one drop 4 times a day into surgical eye starting 4 days prior to surgery    . lisinopril (PRINIVIL,ZESTRIL) 20 MG tablet Take 20 mg by mouth daily.    . methotrexate (RHEUMATREX) 2.5 MG tablet Take 2.5 mg by mouth once a week. Every Monday 3 tab in  Morning 3 tab in the afternoon  1  .  QUEtiapine (SEROQUEL) 50 MG tablet Take 2 tablets (100 mg total) by mouth 2 (two) times daily. 120 tablet 11  . risperiDONE (RISPERDAL) 2 MG tablet Take 1 tablet (2 mg total) by mouth 2 (two) times daily. 60 tablet 11  . traZODone (DESYREL) 150 MG tablet Take 150 mg by mouth at bedtime.     No current facility-administered medications for this visit.     Allergies as of 11/27/2015  . (No Known Allergies)    Vitals: BP 118/80 (BP Location: Right Arm, Patient Position: Sitting, Cuff Size: Large)   Pulse 78   Ht 5\' 5"  (1.651 m)   Wt 274 lb 6.4 oz (124.5 kg)   SpO2 96%   BMI 45.66 kg/m  Last Weight:  Wt Readings from Last 1 Encounters:  11/27/15 274 lb 6.4 oz (124.5 kg)   Last Height:   Ht Readings from Last 1 Encounters:  11/27/15 5\' 5"  (1.651 m)    MMSE - Mini Mental State Exam 07/24/2015 03/12/2015  Orientation to time 0 0  Orientation to Place 5 4  Registration 3 3  Attention/ Calculation 5 5  Recall 0 1  Language- name 2 objects 2 2  Language- repeat 1 1  Language- follow 3 step command 3 3  Language- read & follow direction 1 1  Write a sentence 1 1  Copy design 0 0  Total score 21 21   Montreal Cognitive Assessment  03/12/2015 01/15/2015  Visuospatial/ Executive (0/5) 0 1  Naming (0/3) 2 2  Attention: Read list of digits (0/2) 1 2  Attention: Read list of letters (0/1) 1 1  Attention: Serial 7 subtraction starting at 100 (0/3) 2 2  Language: Repeat phrase (0/2) 1 0  Language : Fluency (0/1) 1 1  Abstraction (0/2) 1 1  Delayed Recall (0/5) 0 0  Orientation (0/6) 2 1  Total 11 11  Adjusted Score (based on education) 12 12     Generalized: Well developed, in no acute distress     Cranial Nerves:  The pupils are equal, round, and reactive to light. Visual fields are full to finger confrontation. Extraocular movements are intact. Trigeminal sensation is intact and the muscles of mastication are normal. The face is symmetric. The palate elevates  in the  midline. Hearing intact. Voice is normal. Shoulder shrug is normal. The tongue has normal motion without fasciculations.    Motor Observation:  No asymmetry, no atrophy, and no involuntary movements noted. Tone:  Normal muscle tone.   Posture:  Posture is normal. normal erect   Strength:  Strength is V/V in the upper and lower limbs.      ASSESSMENT AND PLAN This is a 70 year old female who is here for at least 8 years of progressive memory loss. Montreal cognitive assessment test is 12 out of 30 in the office today again.MMSE 21/30.  There are behavioral symptoms as well as hallucinations and delusions. Likely Lewy-body dementia.   1. Dementia- Lewy body dementia  The patient has not been able to tolerate Aricept, Namenda or Exelon. She is having hallucinations and some delusional thoughts however they are not violent or fearful.   There are risks associated with antipsychotics when taken with a diagnosis of lewy body dementia. Antipsychotics is not recommended unless psychosis is disabling. These medications ar enot FDA approved in dementia and carry a black nbox warning or significant morbidity and mortality. Patient's husband understands and acknowledges, patient acknowledges as well but given dementia her understanding may be limited.     Recommend no driving and 24 hour monitoring of patient Discussed in depth Lewy body dementia with patient and her husband. We'll follow clinically. Continue the Risperdal 1 pill in the morning and 1/2 pill in the afternoon, advised to give the lowest dose possible.  Continue the Trazodone at night (prescribed by pcp)   [facility] [address] [place], [place] [address]  Phone [phone] Fax [phone]  A total of 30 minutes was spent face-to-face with this patient. Over half this time was spent on counseling patient on the lewy-body dementia diagnosis and different diagnostic and  therapeutic options available.

## 2015-11-27 NOTE — Patient Instructions (Signed)
Remember to drink plenty of fluid, eat healthy meals and do not skip any meals. Try to eat protein with a every meal and eat a healthy snack such as fruit or nuts in between meals. Try to keep a regular sleep-wake schedule and try to exercise daily, particularly in the form of walking, 20-30 minutes a day, if you can.   I would like to see you back in 6 months, sooner if we need to. Please call us with any interim questions, concerns, problems, updates or refill requests.   Please also call us for any test results so we can go over those with you on the phone.  My clinical assistant and will answer any of your questions and relay your messages to me and also relay most of my messages to you.   Our phone number is [phone]. We also have an after hours call service for urgent matters and there is a physician on-call for urgent questions. For any emergencies you know to call 911 or go to the nearest emergency room

## 2015-12-09 DIAGNOSIS — H401123 Primary open-angle glaucoma, left eye, severe stage: Secondary | ICD-10-CM | POA: Diagnosis not present

## 2015-12-09 DIAGNOSIS — H401111 Primary open-angle glaucoma, right eye, mild stage: Secondary | ICD-10-CM | POA: Diagnosis not present

## 2015-12-17 DIAGNOSIS — Z79899 Other long term (current) drug therapy: Secondary | ICD-10-CM | POA: Diagnosis not present

## 2015-12-17 DIAGNOSIS — H209 Unspecified iridocyclitis: Secondary | ICD-10-CM | POA: Diagnosis not present

## 2015-12-17 DIAGNOSIS — D869 Sarcoidosis, unspecified: Secondary | ICD-10-CM | POA: Diagnosis not present

## 2015-12-17 DIAGNOSIS — M79674 Pain in right toe(s): Secondary | ICD-10-CM | POA: Diagnosis not present

## 2015-12-20 DIAGNOSIS — Z79899 Other long term (current) drug therapy: Secondary | ICD-10-CM | POA: Diagnosis not present

## 2015-12-20 DIAGNOSIS — H209 Unspecified iridocyclitis: Secondary | ICD-10-CM | POA: Diagnosis not present

## 2015-12-20 DIAGNOSIS — D869 Sarcoidosis, unspecified: Secondary | ICD-10-CM | POA: Diagnosis not present

## 2015-12-23 DIAGNOSIS — M545 Low back pain: Secondary | ICD-10-CM | POA: Diagnosis not present

## 2015-12-23 DIAGNOSIS — M2560 Stiffness of unspecified joint, not elsewhere classified: Secondary | ICD-10-CM | POA: Diagnosis not present

## 2015-12-23 DIAGNOSIS — M9903 Segmental and somatic dysfunction of lumbar region: Secondary | ICD-10-CM | POA: Diagnosis not present

## 2015-12-23 DIAGNOSIS — R293 Abnormal posture: Secondary | ICD-10-CM | POA: Diagnosis not present

## 2015-12-24 DIAGNOSIS — M2560 Stiffness of unspecified joint, not elsewhere classified: Secondary | ICD-10-CM | POA: Diagnosis not present

## 2015-12-24 DIAGNOSIS — R293 Abnormal posture: Secondary | ICD-10-CM | POA: Diagnosis not present

## 2015-12-24 DIAGNOSIS — M545 Low back pain: Secondary | ICD-10-CM | POA: Diagnosis not present

## 2015-12-24 DIAGNOSIS — M9903 Segmental and somatic dysfunction of lumbar region: Secondary | ICD-10-CM | POA: Diagnosis not present

## 2015-12-26 DIAGNOSIS — M2560 Stiffness of unspecified joint, not elsewhere classified: Secondary | ICD-10-CM | POA: Diagnosis not present

## 2015-12-26 DIAGNOSIS — M9903 Segmental and somatic dysfunction of lumbar region: Secondary | ICD-10-CM | POA: Diagnosis not present

## 2015-12-26 DIAGNOSIS — M545 Low back pain: Secondary | ICD-10-CM | POA: Diagnosis not present

## 2015-12-26 DIAGNOSIS — R293 Abnormal posture: Secondary | ICD-10-CM | POA: Diagnosis not present

## 2016-01-01 DIAGNOSIS — M545 Low back pain: Secondary | ICD-10-CM | POA: Diagnosis not present

## 2016-01-01 DIAGNOSIS — M2560 Stiffness of unspecified joint, not elsewhere classified: Secondary | ICD-10-CM | POA: Diagnosis not present

## 2016-01-01 DIAGNOSIS — R293 Abnormal posture: Secondary | ICD-10-CM | POA: Diagnosis not present

## 2016-01-01 DIAGNOSIS — M9903 Segmental and somatic dysfunction of lumbar region: Secondary | ICD-10-CM | POA: Diagnosis not present

## 2016-01-03 DIAGNOSIS — M2560 Stiffness of unspecified joint, not elsewhere classified: Secondary | ICD-10-CM | POA: Diagnosis not present

## 2016-01-03 DIAGNOSIS — R293 Abnormal posture: Secondary | ICD-10-CM | POA: Diagnosis not present

## 2016-01-03 DIAGNOSIS — M545 Low back pain: Secondary | ICD-10-CM | POA: Diagnosis not present

## 2016-01-03 DIAGNOSIS — M9903 Segmental and somatic dysfunction of lumbar region: Secondary | ICD-10-CM | POA: Diagnosis not present

## 2016-01-08 DIAGNOSIS — M9903 Segmental and somatic dysfunction of lumbar region: Secondary | ICD-10-CM | POA: Diagnosis not present

## 2016-01-08 DIAGNOSIS — M545 Low back pain: Secondary | ICD-10-CM | POA: Diagnosis not present

## 2016-01-08 DIAGNOSIS — R293 Abnormal posture: Secondary | ICD-10-CM | POA: Diagnosis not present

## 2016-01-08 DIAGNOSIS — M2560 Stiffness of unspecified joint, not elsewhere classified: Secondary | ICD-10-CM | POA: Diagnosis not present

## 2016-01-09 ENCOUNTER — Other Ambulatory Visit: Payer: Self-pay

## 2016-01-13 DIAGNOSIS — M2560 Stiffness of unspecified joint, not elsewhere classified: Secondary | ICD-10-CM | POA: Diagnosis not present

## 2016-01-13 DIAGNOSIS — M9903 Segmental and somatic dysfunction of lumbar region: Secondary | ICD-10-CM | POA: Diagnosis not present

## 2016-01-13 DIAGNOSIS — M545 Low back pain: Secondary | ICD-10-CM | POA: Diagnosis not present

## 2016-01-13 DIAGNOSIS — R293 Abnormal posture: Secondary | ICD-10-CM | POA: Diagnosis not present

## 2016-01-15 DIAGNOSIS — M545 Low back pain: Secondary | ICD-10-CM | POA: Diagnosis not present

## 2016-01-15 DIAGNOSIS — M2560 Stiffness of unspecified joint, not elsewhere classified: Secondary | ICD-10-CM | POA: Diagnosis not present

## 2016-01-15 DIAGNOSIS — M9903 Segmental and somatic dysfunction of lumbar region: Secondary | ICD-10-CM | POA: Diagnosis not present

## 2016-01-15 DIAGNOSIS — R293 Abnormal posture: Secondary | ICD-10-CM | POA: Diagnosis not present

## 2016-01-17 DIAGNOSIS — R293 Abnormal posture: Secondary | ICD-10-CM | POA: Diagnosis not present

## 2016-01-17 DIAGNOSIS — M9903 Segmental and somatic dysfunction of lumbar region: Secondary | ICD-10-CM | POA: Diagnosis not present

## 2016-01-17 DIAGNOSIS — M2560 Stiffness of unspecified joint, not elsewhere classified: Secondary | ICD-10-CM | POA: Diagnosis not present

## 2016-01-17 DIAGNOSIS — M545 Low back pain: Secondary | ICD-10-CM | POA: Diagnosis not present

## 2016-01-20 DIAGNOSIS — R293 Abnormal posture: Secondary | ICD-10-CM | POA: Diagnosis not present

## 2016-01-20 DIAGNOSIS — M9903 Segmental and somatic dysfunction of lumbar region: Secondary | ICD-10-CM | POA: Diagnosis not present

## 2016-01-20 DIAGNOSIS — M545 Low back pain: Secondary | ICD-10-CM | POA: Diagnosis not present

## 2016-01-20 DIAGNOSIS — M2560 Stiffness of unspecified joint, not elsewhere classified: Secondary | ICD-10-CM | POA: Diagnosis not present

## 2016-01-21 DIAGNOSIS — M2560 Stiffness of unspecified joint, not elsewhere classified: Secondary | ICD-10-CM | POA: Diagnosis not present

## 2016-01-21 DIAGNOSIS — M545 Low back pain: Secondary | ICD-10-CM | POA: Diagnosis not present

## 2016-01-21 DIAGNOSIS — M9903 Segmental and somatic dysfunction of lumbar region: Secondary | ICD-10-CM | POA: Diagnosis not present

## 2016-01-21 DIAGNOSIS — R293 Abnormal posture: Secondary | ICD-10-CM | POA: Diagnosis not present

## 2016-01-22 DIAGNOSIS — H35351 Cystoid macular degeneration, right eye: Secondary | ICD-10-CM | POA: Diagnosis not present

## 2016-01-22 DIAGNOSIS — H35352 Cystoid macular degeneration, left eye: Secondary | ICD-10-CM | POA: Diagnosis not present

## 2016-01-22 DIAGNOSIS — H35353 Cystoid macular degeneration, bilateral: Secondary | ICD-10-CM | POA: Diagnosis not present

## 2016-01-22 DIAGNOSIS — D8683 Sarcoid iridocyclitis: Secondary | ICD-10-CM | POA: Diagnosis not present

## 2016-01-22 DIAGNOSIS — H2013 Chronic iridocyclitis, bilateral: Secondary | ICD-10-CM | POA: Diagnosis not present

## 2016-02-06 DIAGNOSIS — D869 Sarcoidosis, unspecified: Secondary | ICD-10-CM | POA: Diagnosis not present

## 2016-02-06 DIAGNOSIS — H209 Unspecified iridocyclitis: Secondary | ICD-10-CM | POA: Diagnosis not present

## 2016-02-06 DIAGNOSIS — M79674 Pain in right toe(s): Secondary | ICD-10-CM | POA: Diagnosis not present

## 2016-02-06 DIAGNOSIS — Z79899 Other long term (current) drug therapy: Secondary | ICD-10-CM | POA: Diagnosis not present

## 2016-02-17 DIAGNOSIS — Z008 Encounter for other general examination: Secondary | ICD-10-CM | POA: Diagnosis not present

## 2016-03-10 ENCOUNTER — Telehealth: Payer: Self-pay | Admitting: Neurology

## 2016-03-10 NOTE — Telephone Encounter (Signed)
Called husband back. Her symptoms started this past Friday of her being more Agitated/restless. Sx are intermittent. He notices she is more agitated/restless from 1pm and beyond. She is currently taking risperdal. 1/2 tablet in morning and 1/2 tablet at night.   She went to eye doctor and was put on prednisone not too long ago. She started to hallucinate while on it. Husband stopped medication on his own and her hallucinations stopped.   When I asked husband if she was having any other symptoms, he stated he did notice she is going to the bathroom every 10 minutes.. She will sit down to go, but there is nothing that comes out. He states she does tell her that she feels like she has to go to the bathroom a lot. He is there in the bathroom with her and checks to see if she goes, but most of the time, there is nothing there. I educated husband on signs/symptoms of UTI. I advised him to call her PCP to see if they can evaluate her for UTI. Could be UTI causing sx, especially since her sx started this past Friday. Advised I will also speak to [place], MD and see if she would like to do anything more. I will call back to advise once I speak with her. He verbalized understanding.   Husband said otherwise, she is doing well.

## 2016-03-10 NOTE — Telephone Encounter (Signed)
Patient;s husband is calling to discuss medication risperiDONE (RISPERDAL) 2 MG tablet that the patient takes. She seems to be restless and jittery. Should the patient increase the dosage from 1/2 pill to 1 pill. Please call and discuss.

## 2016-03-10 NOTE — Telephone Encounter (Signed)
I agree, she needs to be evaluated for UTI before I make any adjustments to meds thanks

## 2016-03-11 NOTE — Telephone Encounter (Signed)
[facility] in to see an NP thanks

## 2016-03-11 NOTE — Telephone Encounter (Signed)
Dr Jaynee Eagles- spouse called back. See message below. How would you like to proceed?

## 2016-03-11 NOTE — Telephone Encounter (Signed)
Spouse called to advise, when he said wife needs to go and can't go, he was referring to [facility] not urinating.

## 2016-03-11 NOTE — Telephone Encounter (Signed)
Called husband back. Advised if pt cannot have BM, he should still contact PCP to address constipation. He verbalized understanding  Made f/u with MM,NP tomorrow at 10am, check in 945am to address agitation/restlessness/possible medication change. He verbalized understanding.

## 2016-03-12 ENCOUNTER — Ambulatory Visit: Payer: Self-pay | Admitting: Adult Health

## 2016-03-12 ENCOUNTER — Telehealth: Payer: Self-pay

## 2016-03-12 NOTE — Telephone Encounter (Signed)
Patient did not show to appt.  

## 2016-03-13 ENCOUNTER — Encounter: Payer: Self-pay | Admitting: Adult Health

## 2016-03-13 DIAGNOSIS — K5904 Chronic idiopathic constipation: Secondary | ICD-10-CM | POA: Diagnosis not present

## 2016-03-16 DIAGNOSIS — H35352 Cystoid macular degeneration, left eye: Secondary | ICD-10-CM | POA: Diagnosis not present

## 2016-03-16 DIAGNOSIS — D8683 Sarcoid iridocyclitis: Secondary | ICD-10-CM | POA: Diagnosis not present

## 2016-03-16 DIAGNOSIS — H35353 Cystoid macular degeneration, bilateral: Secondary | ICD-10-CM | POA: Diagnosis not present

## 2016-03-16 DIAGNOSIS — H2013 Chronic iridocyclitis, bilateral: Secondary | ICD-10-CM | POA: Diagnosis not present

## 2016-03-16 DIAGNOSIS — H35351 Cystoid macular degeneration, right eye: Secondary | ICD-10-CM | POA: Diagnosis not present

## 2016-03-16 DIAGNOSIS — H2011 Chronic iridocyclitis, right eye: Secondary | ICD-10-CM | POA: Diagnosis not present

## 2016-03-16 DIAGNOSIS — H2012 Chronic iridocyclitis, left eye: Secondary | ICD-10-CM | POA: Diagnosis not present

## 2016-03-24 DIAGNOSIS — Z79899 Other long term (current) drug therapy: Secondary | ICD-10-CM | POA: Diagnosis not present

## 2016-03-24 DIAGNOSIS — D869 Sarcoidosis, unspecified: Secondary | ICD-10-CM | POA: Diagnosis not present

## 2016-03-24 DIAGNOSIS — H209 Unspecified iridocyclitis: Secondary | ICD-10-CM | POA: Diagnosis not present

## 2016-05-08 DIAGNOSIS — H209 Unspecified iridocyclitis: Secondary | ICD-10-CM | POA: Diagnosis not present

## 2016-05-08 DIAGNOSIS — D869 Sarcoidosis, unspecified: Secondary | ICD-10-CM | POA: Diagnosis not present

## 2016-05-08 DIAGNOSIS — Z79899 Other long term (current) drug therapy: Secondary | ICD-10-CM | POA: Diagnosis not present

## 2016-05-15 DIAGNOSIS — H2012 Chronic iridocyclitis, left eye: Secondary | ICD-10-CM | POA: Diagnosis not present

## 2016-05-15 DIAGNOSIS — H35351 Cystoid macular degeneration, right eye: Secondary | ICD-10-CM | POA: Diagnosis not present

## 2016-05-15 DIAGNOSIS — H2013 Chronic iridocyclitis, bilateral: Secondary | ICD-10-CM | POA: Diagnosis not present

## 2016-05-15 DIAGNOSIS — H35353 Cystoid macular degeneration, bilateral: Secondary | ICD-10-CM | POA: Diagnosis not present

## 2016-05-15 DIAGNOSIS — H2011 Chronic iridocyclitis, right eye: Secondary | ICD-10-CM | POA: Diagnosis not present

## 2016-05-15 DIAGNOSIS — H35352 Cystoid macular degeneration, left eye: Secondary | ICD-10-CM | POA: Diagnosis not present

## 2016-05-25 ENCOUNTER — Encounter: Payer: Self-pay | Admitting: Neurology

## 2016-05-25 ENCOUNTER — Ambulatory Visit (INDEPENDENT_AMBULATORY_CARE_PROVIDER_SITE_OTHER): Payer: Medicare Other | Admitting: Neurology

## 2016-05-25 VITALS — Ht 65.0 in | Wt 252.4 lb

## 2016-05-25 DIAGNOSIS — F02818 Dementia in other diseases classified elsewhere, unspecified severity, with other behavioral disturbance: Secondary | ICD-10-CM

## 2016-05-25 DIAGNOSIS — G3183 Dementia with Lewy bodies: Secondary | ICD-10-CM

## 2016-05-25 DIAGNOSIS — F0281 Dementia in other diseases classified elsewhere with behavioral disturbance: Secondary | ICD-10-CM | POA: Diagnosis not present

## 2016-05-25 NOTE — Progress Notes (Signed)
[facility]    Referring Provider: Shirline Frees, MD Primary Care Physician:  Shirline Frees, MD  REASON FOR VISIT: follow up- Lewy-body dementia HISTORY FROM: patient and husband  Interval history 05/25/2016: Ms. Tanya Jensen is a 70 year old female with Lewy body dementia. She returns today for follow-up. The patient has tried Aricept, Namenda and Exelon but has not been able to tolerate the medication. She is on Risperdal 2mg  twice daily for hallucinations and agitation. She lives with her husband. She is crying today because she could not remember the date on the MMSE. She notices her memory loss and it is frustrating for her. Husband is using 1/2 a pill twice daily unless needed for episodes of hallucinations and agitation. Husband is home during the day and someone sits with patient when he is at work. Her hallucinations have been under good control. He has noticed prednisone can trigger hallucinations in her. She goes to bed at 7pm, she takes Trazodone at night and she sleeps all night. No falls. No dysphagia. She takes vitamin D and a daily multivitamin. She does not cook, husband sets out her clothes but she can dress herself, she helps fold clothes, showers and toilets herself. Some confusion in the late afternoon when she says she wants to go home but is actually already at home. She plays the piano at home with assistance from husband, he is exercising her brain.  Interval update 11/27/2015: She is doing very well. She is on Risperdal 2mg  twice daily and we discussed the risks associated with these medications. He has decreased to 1 pill in the morning 2mg  and discussed trying to limit as much as possible. He may give her 1/2 a pill in the afternoon if needed. She is not having any side effects to the Rispersdal. It has helped with the delusions and hallucinations. She has not left the house recently. No falls. She has some back pain and she is on a muscle relaxer. Sleeping  well, no issues at night. She takes 1/2 a trazodone at night. Husband and wife is happy. There is a book, 36-hour day. She feels like she is doing very well. Mother and grandmother had dementia. Discussed Lewy body dementia. She was a Theme park manager, [employer].   HISTORY OF PRESENT ILLNESS Ms. Tanya Jensen is a 71 year old female with Lewy body dementia. She returns today for follow-up. The patient has tried Aricept, Namenda and Exelon but has not been able to tolerate the medication. Her husband reports that she is having hallucinations again. Her hallucinations consist of seeing people in her home. The hallucinations are not violent or fearful. She will occasionally has some delusional thinking such as feeling as if her jewelry has been stolen. She also states to me when her husband is out of the room-she feels that he is trying to divorce her. The patient and her husband feel that her memory has remained stable. She is able to complete all ADLs independently. She is currently not operating a motor vehicle primarily due to her vision. The patient is having trouble sleeping at night and her primary care started her on trazodone 50 mg at bedtime. She is been taking this for 1 week but has not seen any benefit yet. She returns today for an evaluation.  interval update 06/12/2015: She had side effects to donepezil. She became more confused and talking in sleep. Stopping it helped. Could not tolerate the namzeric either, more confusion. Will start Rivastigmine which is better studied in lewy body dementia.  No more problems with leaving the house. She is doing well, patient and husband deny hallucinations since last being see, no swallowing difficulties, no falls, no accidents. I recommend patient not drive anymore. Husband is with patient all the time except when at work and patient is at home and doing well. She does go out a lot but she has friends come pick her up and take her out. Her appetite has been good,  eatong a good diet, getting out and walking, mood is good. Everything has been going well. No falls. No issues since last being seen, husband and patient endorse all is well. She is comfortably staying at her home alone as well. No delusions, no recent hallucinations.   Interval update 03/12/2015: Wife says things are fine. No incidents since the Silver Alert in September. She left in the car and was stopped by a state trooper. She is still having visual hallucinations. She also has illusions and delusions. But not like she was having before. B12 wnl. TSH wnl.   HPI: Tanya Jensen is a 70 y.o. female here as a referral from Dr. Kenton Kingfisher for forgetfullness. PMHx HTN. She says she drives, she never gets lost. She was hallucinating. She has memory loss. She thought someone was in the home. Multiple times. She see people in church. She saw cowgirls one day. Daughter and husband are here. Daughter brought her to church and her mom hugged her and didn't remember her. Aroiund the age of 56 she started repeating things. Noticed it 8 years ago. Getting worse. Kids had a meeting with the husband and wanted a workup completed. More recent memory than remote. She is more combative. She is changing a little bit. She has glaucoma. Vision is worsening. She has a cataract developing on one eye. She was in bed one night, she was awakened . She saw a lady on the wall. She thought the headrest in the car were people. Last Thursday she thought she saw a white female in the bathroom. She is forgetting streets she has been on in the past frequently. She calls multiple people telling them she is seeing things, says people are waiting in the car. She has called multiple people to check the house. She called someone from church 25 times. No shuffling gait or tremors. Patient's mother and grandmother had dementia, started around 76. She has lost her house and her car due to mismanagement of finances. They had to stay in a hotel when they  lost everything. She is not bathing. She used to be very sharp, now she is forgetting she has rental property. Slowly progressive. She doesn't remember a lot of the incidents. She is getting lost. She should not be driving anywhere.    Reviewed notes, labs and imaging from outside physicians, which showed:  CT of the head 03/2012:personally reviewed and agree with findings below  Comparison: None.  Findings: The ventricles are normal in size, for this patient's age, and normal in configuration.  here are no parenchymal masses or mass effect. There are no areas of abnormal parenchymal attenuation. There is no evidence of a recent infarct.  There are no extra-axial masses or abnormal fluid collections.  No intracranial hemorrhage.  Right maxillary sinus mucosal thickening. The remaining visualized sinuses and mastoid air cells are clear. No skull lesion/fracture.  IMPRESSION: No intracranial abnormality. Right maxillary sinus mucosal thickening.  TSH wnl, ACE <14,   REVIEW OF SYSTEMS:Out of a complete 14 system review of symptoms, the patient complains  only of the following symptoms, and all other reviewed systems are negative.  See HPI  ALLERGIES: No Known Allergies   Social History   Social History  . Marital status: Married    Spouse name: [name]  . Number of children: 3  . Years of education: 12   Occupational History  . Not on file.   Social History Main Topics  . Smoking status: Never Smoker  . Smokeless tobacco: Never Used  . Alcohol use No  . Drug use: No  . Sexual activity: Not on file   Other Topics Concern  . Not on file   Social History Narrative   Lives at home with husband, [name].    Family History  Problem Relation Age of Onset  . Dementia Mother     Past Medical History:  Diagnosis Date  . Hypertension     Past Surgical History:  Procedure Laterality Date  . APPENDECTOMY    . TONSILLECTOMY    . TUBAL LIGATION        Current Outpatient Prescriptions  Medication Sig Dispense Refill  . aspirin EC 81 MG tablet Take 81 mg by mouth daily.    Marland Kitchen atropine 1 % ophthalmic solution Place 1 drop into both eyes 2 (two) times daily.     . bimatoprost (LUMIGAN) 0.01 % SOLN Place 2 drops into both eyes at bedtime.     . Brinzolamide-Brimonidine (SIMBRINZA) 1-0.2 % SUSP Apply 1 drop to eye 3 (three) times daily.    . Bromfenac Sodium (PROLENSA) 0.07 % SOLN Apply 2 drops to eye 2 (two) times daily.    . cloNIDine (CATAPRES) 0.1 MG tablet Take 0.1 mg by mouth 2 (two) times daily.    . folic acid (FOLVITE) 1 MG tablet Take 1 mg by mouth daily.  1  . furosemide (LASIX) 20 MG tablet Take 20 mg by mouth daily.    Marland Kitchen lisinopril (PRINIVIL,ZESTRIL) 20 MG tablet Take 20 mg by mouth daily.    Marland Kitchen loteprednol (LOTEMAX) 0.5 % ophthalmic suspension Place 2 drops into both eyes 3 (three) times daily.    . methotrexate (RHEUMATREX) 2.5 MG tablet Take 2.5 mg by mouth once a week. Every Monday 3 tab in  Morning 3 tab in the afternoon  1  . risperiDONE (RISPERDAL) 2 MG tablet Take 1 tablet (2 mg total) by mouth 2 (two) times daily. 60 tablet 11  . timolol (TIMOPTIC) 0.5 % ophthalmic solution Place 2 drops into both eyes 2 (two) times daily.  4  . traZODone (DESYREL) 150 MG tablet Take 150 mg by mouth at bedtime.     No current facility-administered medications for this visit.     Allergies as of 05/25/2016  . (No Known Allergies)    Vitals: There were no vitals taken for this visit. Last Weight:  Wt Readings from Last 1 Encounters:  11/27/15 274 lb 6.4 oz (124.5 kg)   Last Height:   Ht Readings from Last 1 Encounters:  11/27/15 5\' 5"  (1.651 m)    MMSE - Mini Mental State Exam 05/25/2016 07/24/2015 03/12/2015  Orientation to time 1 0 0  Orientation to Place 4 5 4   Registration 3 3 3   Attention/ Calculation 1 5 5   Recall 1 0 1  Language- name 2 objects 2 2 2   Language- repeat 1 1 1   Language- follow 3 step command 3 3 3    Language- read & follow direction 1 1 1   Write a sentence 0 1 1  Copy  design 0 0 0  Total score 17 21 21    Montreal Cognitive Assessment  03/12/2015 01/15/2015  Visuospatial/ Executive (0/5) 0 1  Naming (0/3) 2 2  Attention: Read list of digits (0/2) 1 2  Attention: Read list of letters (0/1) 1 1  Attention: Serial 7 subtraction starting at 100 (0/3) 2 2  Language: Repeat phrase (0/2) 1 0  Language : Fluency (0/1) 1 1  Abstraction (0/2) 1 1  Delayed Recall (0/5) 0 0  Orientation (0/6) 2 1  Total 11 11  Adjusted Score (based on education) 12 12   Generalized:Well developed, in no acute distress     Cranial Nerves:  The pupils are equal, round, and reactive to light. Visual fields are full to finger confrontation. Extraocular movements are intact. Trigeminal sensation is intact and the muscles of mastication are normal. The face is symmetric. The palate elevates in the midline. Hearing intact. Voice is normal. Shoulder shrug is normal. The tongue has normal motion without fasciculations.    Motor Observation:  No asymmetry, no atrophy, and no involuntary movements noted. Tone:  Normal muscle tone.   Posture:  Posture is normal. normal erect   Strength:  Strength is V/V in the upper and lower limbs.      ASSESSMENT AND PLAN This is a 71 year old female who is here for at least 8 years of progressive memory loss. Her MMSE has declined from 21 to 16 and  Montreal cognitive assessment was 12 out of 30 . There are behavioral symptoms as well as hallucinations and delusions. Likely Lewy-body dementia.   1. Dementia- Lewy body dementia  The patient has not been able to tolerate Aricept, Namenda or Exelon. She is having hallucinations and some delusional thoughts however they are not violent or fearful. Doing well on Risperdal.   There are risks associated with antipsychotics when taken with a diagnosis of lewy body dementia. Antipsychotics is not  recommended unless psychosis is disabling. These medications are not FDA approved in dementia and carry a black nbox warning or significant morbidity and mortality. Patient's husband understands and acknowledges, patient acknowledges as well but given dementia her understanding may be limited.    Recommend no driving and 24 hour monitoring of patient Discussed in depth Lewy body dementia with patient and her husband. We'll follow clinically. Continue the Risperdal 1/2 pill in the morning and 1/2 pill in the afternoon, advised to give the lowest dose possible.  Continue the Trazodone at night (prescribed by pcp)   Today's history and physical demonstrated very substantial and measurable cognitive losses consistent with dementia. Based on the  substantial degree of impairment it is clear that she does not have the capacity to make an informed and appropriate decisions on her healthcare and finances. I do recommend that she lives in a structured setting or with 24-hour care which her husband is providing for her. It is also clear that patient does not comprehend the degree of cognitive losses but she does express frustration at her memory changes. Patient suffering from substantial cognitive impairment due to dementia. She can perform ADLs with assistance but needs help with all IADLs.   [facility] [address] [place], [place] [address]  Phone [phone] Fax [phone]  A total of 30 minutes was spent face-to-face with this patient. Over half this time was spent on counseling patient on the lewy-body dementia diagnosis and different diagnostic and therapeutic options available.

## 2016-05-25 NOTE — Patient Instructions (Signed)
Remember to drink plenty of fluid, eat healthy meals and do not skip any meals. Try to eat protein with a every meal and eat a healthy snack such as fruit or nuts in between meals. Try to keep a regular sleep-wake schedule and try to exercise daily, particularly in the form of walking, 20-30 minutes a day, if you can.   As far as your medications are concerned, I would like to suggest: Continue current medications  I would like to see you back in 6-9 months, sooner if we need to. Please call us with any interim questions, concerns, problems, updates or refill requests.   Our phone number is [phone]. We also have an after hours call service for urgent matters and there is a physician on-call for urgent questions. For any emergencies you know to call 911 or go to the nearest emergency room

## 2016-06-01 ENCOUNTER — Ambulatory Visit: Payer: Medicare Other | Admitting: Neurology

## 2016-06-24 DIAGNOSIS — Z79899 Other long term (current) drug therapy: Secondary | ICD-10-CM | POA: Diagnosis not present

## 2016-06-24 DIAGNOSIS — D869 Sarcoidosis, unspecified: Secondary | ICD-10-CM | POA: Diagnosis not present

## 2016-06-24 DIAGNOSIS — H209 Unspecified iridocyclitis: Secondary | ICD-10-CM | POA: Diagnosis not present

## 2016-08-05 DIAGNOSIS — D869 Sarcoidosis, unspecified: Secondary | ICD-10-CM | POA: Diagnosis not present

## 2016-08-05 DIAGNOSIS — Z79899 Other long term (current) drug therapy: Secondary | ICD-10-CM | POA: Diagnosis not present

## 2016-08-05 DIAGNOSIS — H209 Unspecified iridocyclitis: Secondary | ICD-10-CM | POA: Diagnosis not present

## 2016-09-03 DIAGNOSIS — H35353 Cystoid macular degeneration, bilateral: Secondary | ICD-10-CM | POA: Diagnosis not present

## 2016-09-03 DIAGNOSIS — H35352 Cystoid macular degeneration, left eye: Secondary | ICD-10-CM | POA: Diagnosis not present

## 2016-09-03 DIAGNOSIS — D8683 Sarcoid iridocyclitis: Secondary | ICD-10-CM | POA: Diagnosis not present

## 2016-09-03 DIAGNOSIS — H35351 Cystoid macular degeneration, right eye: Secondary | ICD-10-CM | POA: Diagnosis not present

## 2016-09-10 DIAGNOSIS — D869 Sarcoidosis, unspecified: Secondary | ICD-10-CM | POA: Diagnosis not present

## 2016-09-10 DIAGNOSIS — F5101 Primary insomnia: Secondary | ICD-10-CM | POA: Diagnosis not present

## 2016-09-10 DIAGNOSIS — F0281 Dementia in other diseases classified elsewhere with behavioral disturbance: Secondary | ICD-10-CM | POA: Diagnosis not present

## 2016-09-10 DIAGNOSIS — I10 Essential (primary) hypertension: Secondary | ICD-10-CM | POA: Diagnosis not present

## 2016-09-10 DIAGNOSIS — G3183 Dementia with Lewy bodies: Secondary | ICD-10-CM | POA: Diagnosis not present

## 2016-09-10 DIAGNOSIS — K5904 Chronic idiopathic constipation: Secondary | ICD-10-CM | POA: Diagnosis not present

## 2016-09-10 DIAGNOSIS — Z6837 Body mass index (BMI) 37.0-37.9, adult: Secondary | ICD-10-CM | POA: Diagnosis not present

## 2016-11-05 DIAGNOSIS — H35353 Cystoid macular degeneration, bilateral: Secondary | ICD-10-CM | POA: Diagnosis not present

## 2016-11-05 DIAGNOSIS — H2013 Chronic iridocyclitis, bilateral: Secondary | ICD-10-CM | POA: Diagnosis not present

## 2016-11-05 DIAGNOSIS — H35351 Cystoid macular degeneration, right eye: Secondary | ICD-10-CM | POA: Diagnosis not present

## 2016-11-05 DIAGNOSIS — H35352 Cystoid macular degeneration, left eye: Secondary | ICD-10-CM | POA: Diagnosis not present

## 2016-11-28 ENCOUNTER — Emergency Department (HOSPITAL_BASED_OUTPATIENT_CLINIC_OR_DEPARTMENT_OTHER): Payer: Medicare Other

## 2016-11-28 ENCOUNTER — Emergency Department (HOSPITAL_BASED_OUTPATIENT_CLINIC_OR_DEPARTMENT_OTHER)
Admission: EM | Admit: 2016-11-28 | Discharge: 2016-11-28 | Disposition: A | Discharge status: Home / Self Care | Payer: Medicare Other | Attending: Emergency Medicine | Admitting: Emergency Medicine

## 2016-11-28 ENCOUNTER — Encounter (HOSPITAL_BASED_OUTPATIENT_CLINIC_OR_DEPARTMENT_OTHER): Payer: Self-pay | Admitting: *Deleted

## 2016-11-28 DIAGNOSIS — R079 Chest pain, unspecified: Secondary | ICD-10-CM | POA: Insufficient documentation

## 2016-11-28 DIAGNOSIS — Z7982 Long term (current) use of aspirin: Secondary | ICD-10-CM | POA: Diagnosis not present

## 2016-11-28 DIAGNOSIS — Z79899 Other long term (current) drug therapy: Secondary | ICD-10-CM | POA: Diagnosis not present

## 2016-11-28 DIAGNOSIS — I10 Essential (primary) hypertension: Secondary | ICD-10-CM | POA: Insufficient documentation

## 2016-11-28 HISTORY — DX: Unspecified dementia, unspecified severity, without behavioral disturbance, psychotic disturbance, mood disturbance, and anxiety: F03.90

## 2016-11-28 LAB — CBC
HCT: 31.9 % — ABNORMAL LOW (ref 36.0–46.0)
Hemoglobin: 11.5 g/dL — ABNORMAL LOW (ref 12.0–15.0)
MCH: 31 pg (ref 26.0–34.0)
MCHC: 36.1 g/dL — AB (ref 30.0–36.0)
MCV: 86 fL (ref 78.0–100.0)
PLATELETS: 195 10*3/uL (ref 150–400)
RBC: 3.71 MIL/uL — AB (ref 3.87–5.11)
RDW: 15.2 % (ref 11.5–15.5)
WBC: 6 10*3/uL (ref 4.0–10.5)

## 2016-11-28 LAB — TROPONIN I
Troponin I: <0.03 ng/mL (ref <0.03)
Troponin I: <0.03 ng/mL (ref <0.03)

## 2016-11-28 LAB — BASIC METABOLIC PANEL
Anion gap: 11 (ref 5–15)
BUN: 29 mg/dL — AB (ref 6–20)
CHLORIDE: 102 mmol/L (ref 101–111)
CO2: 23 mmol/L (ref 22–32)
CREATININE: 1.9 mg/dL — AB (ref 0.44–1.00)
Calcium: 9.4 mg/dL (ref 8.9–10.3)
GFR calc Af Amer: 30 mL/min — ABNORMAL LOW (ref >60)
GFR calc non Af Amer: 26 mL/min — ABNORMAL LOW (ref >60)
GLUCOSE: 114 mg/dL — AB (ref 65–99)
POTASSIUM: 3.7 mmol/L (ref 3.5–5.1)
Sodium: 136 mmol/L (ref 135–145)

## 2016-11-28 NOTE — ED Provider Notes (Signed)
Level V caveat dementia history is obtained from patient's husband patient had an episode of anterior chest pain across bilateral chest for 5 minutes on 11/26/2016 and another episode lasting 5 minutes this afternoon. She is presently asymptomatic. No treatment prior to coming here. Cardiac risk factors include hypertension otherwise negative. On exam no distress lungs clear auscultation heart regular rate and rhythm no murmurs or rubs abdomen nondistended nontender some is out edema. ED ECG REPORT   Date: 11/28/2016  Rate: 77  Rhythm: normal sinus rhythm  QRS Axis: left  Intervals: normal  ST/T Wave abnormalities: normal  Conduction Disutrbances:none  Narrative Interpretation:   Old EKG Reviewed: Unchanged from 03/11/2012  I have personally reviewed the EKG tracing and agree with the computerized printout as noted.   Orlie Dakin, MD 11/29/16 0001

## 2016-11-28 NOTE — ED Notes (Signed)
ED Provider at bedside. 

## 2016-11-28 NOTE — ED Notes (Signed)
Pt remains mildly restless, fidgety and confused, also polite and easily redirected. Husband wanting to give her usual risperidone. Asked to wait at this time. Pt to xray via stretcher.

## 2016-11-28 NOTE — ED Notes (Signed)
Alert, NAD, calm, soft spoken, passively interactive, mildly fidgety, resps e/u, speaking in clear complete sentences, no dyspnea noted, skin W&D, VSS, EDP Dr. Winfred Leeds into room, at [facility]. Family at [facility].

## 2016-11-28 NOTE — Discharge Instructions (Signed)
Please read and follow all provided instructions.  Your diagnoses today include:  1. Chest pain, unspecified type     Tests performed today include: An EKG of your heart A chest x-ray Cardiac enzymes - a blood test for heart muscle damage Blood counts and electrolytes Vital signs. See below for your results today.   Medications prescribed:   Take any prescribed medications only as directed.  Follow-up instructions: Please follow-up with your primary care provider as soon as you can for further evaluation of your symptoms.   Return instructions:  SEEK IMMEDIATE MEDICAL ATTENTION IF: You have severe chest pain, especially if the pain is crushing or pressure-like and spreads to the arms, back, neck, or jaw, or if you have sweating, nausea (feeling sick to your stomach), or shortness of breath. THIS IS AN EMERGENCY. Don't wait to see if the pain will go away. Get medical help at once. Call 911 or 0 (operator). DO NOT drive yourself to the hospital.  Your chest pain gets worse and does not go away with rest.  You have an attack of chest pain lasting longer than usual, despite rest and treatment with the medications your caregiver has prescribed.  You wake from sleep with chest pain or shortness of breath. You feel dizzy or faint. You have chest pain not typical of your usual pain for which you originally saw your caregiver.  You have any other emergent concerns regarding your health.  Additional Information: Chest pain comes from many different causes. Your caregiver has diagnosed you as having chest pain that is not specific for one problem, but does not require admission.  You are at low risk for an acute heart condition or other serious illness.   Your vital signs today were: BP 107/76    Pulse 74    Temp 98.1 F (36.7 C) (Oral)    Resp 18    Ht 5\' 8"  (1.727 m)    Wt 102.1 kg (225 lb)    SpO2 100%    BMI 34.21 kg/m  If your blood pressure (BP) was elevated above 135/85 this visit,  please have this repeated by your doctor within one month. --------------

## 2016-11-28 NOTE — ED Triage Notes (Signed)
Pt has dementia -- hx obtained from husband: pt reports chest pain x2 days. Denies sob, n/v.

## 2016-11-28 NOTE — ED Provider Notes (Signed)
[name] Provider Note   CSN: [id number] Arrival date & time: 11/28/16  1845     History   Chief Complaint No chief complaint on file.   HPI Tanya Jensen is a 71 y.o. female.  HPI  71 y.o. female with a hx of Dementia, HTN, presents to the Emergency Department today due to intermittent chest pain. First occurrence on Thursday while at rest. Seemed to go away after a few minutes. No worsening with exertion. No N/V. No diaphoresis. No trauma to area. Husband states that she has been fine the past few days until 45 min ago when she complained of chest pain again on anterior chest. Declines pain currently. This again occurred at rest. No recent travel or surgeries. No hx ACS. No FH. Risk factors for ACS include HTN. No fevers. No cough or URI symptoms. No other symptoms noted.    Level V Caveat; Dementia  Past Medical History:  Diagnosis Date  . Dementia   . Hypertension     Patient Active Problem List   Diagnosis Date Noted  . Lewy body dementia with behavioral disturbance 03/19/2015  . Dementia with behavioral disturbance 01/16/2015  . Delusions ([place]) 01/16/2015  . Hallucinations 01/16/2015    Past Surgical History:  Procedure Laterality Date  . APPENDECTOMY    . TONSILLECTOMY    . TUBAL LIGATION      OB History    No data available       Home Medications    Prior to Admission medications   Medication Sig Start Date End Date Taking? Authorizing Provider  aspirin EC 81 MG tablet Take 81 mg by mouth daily.    [provider]  atropine 1 % ophthalmic solution Place 1 drop into both eyes 2 (two) times daily.     [provider]  bimatoprost (LUMIGAN) 0.01 % SOLN Place 2 drops into both eyes at bedtime.  02/22/14   [provider]  Brinzolamide-Brimonidine (SIMBRINZA) 1-0.2 % SUSP Apply 1 drop to eye 3 (three) times daily.    [provider]  Bromfenac Sodium (PROLENSA) 0.07 % SOLN Apply 2 drops to eye 2 (two)  times daily.    [provider]  cloNIDine (CATAPRES) 0.1 MG tablet Take 0.1 mg by mouth 2 (two) times daily.    [provider]  folic acid (FOLVITE) 1 MG tablet Take 1 mg by mouth daily. 02/26/15   [provider]  furosemide (LASIX) 20 MG tablet Take 20 mg by mouth daily.    [provider]  lisinopril (PRINIVIL,ZESTRIL) 20 MG tablet Take 20 mg by mouth daily.    [provider]  loteprednol (LOTEMAX) 0.5 % ophthalmic suspension Place 2 drops into both eyes 3 (three) times daily.    [provider]  methotrexate (RHEUMATREX) 2.5 MG tablet Take 2.5 mg by mouth once a week. Every Monday 3 tab in  Morning 3 tab in the afternoon 02/26/15   [provider]  risperiDONE (RISPERDAL) 2 MG tablet Take 1 tablet (2 mg total) by mouth 2 (two) times daily. 10/29/15   Melvenia Beam, MD  timolol (TIMOPTIC) 0.5 % ophthalmic solution Place 2 drops into both eyes 2 (two) times daily. 04/19/16   [provider]  traZODone (DESYREL) 150 MG tablet Take 150 mg by mouth at bedtime.    [provider]    Family History Family History  Problem Relation Age of Onset  . Dementia Mother     Social History  Social History  Substance Use Topics  . Smoking status: Never Smoker  . Smokeless tobacco: Never Used  . Alcohol use No     Allergies   Patient has no known allergies.   Review of Systems Review of Systems  Unable to perform ROS: Dementia     Physical Exam Updated Vital Signs BP 107/76   Pulse 74   Temp 98.1 F (36.7 C) (Oral)   Resp 18   Ht 5\' 8"  (1.727 m)   Wt 102.1 kg (225 lb)   SpO2 100%   BMI 34.21 kg/m   Physical Exam  Constitutional: She is oriented to person, place, and time. Vital signs are normal. She appears well-developed and well-nourished. No distress.  HENT:  Head: Normocephalic and atraumatic.  Right Ear: Hearing, tympanic membrane, external ear and ear canal normal.  Left Ear: Hearing,  tympanic membrane, external ear and ear canal normal.  Nose: Nose normal.  Mouth/Throat: Uvula is midline, oropharynx is clear and moist and mucous membranes are normal. No trismus in the jaw. No oropharyngeal exudate, posterior oropharyngeal erythema or tonsillar abscesses.  Eyes: Pupils are equal, round, and reactive to light. Conjunctivae and EOM are normal.  Neck: Normal range of motion. Neck supple. No tracheal deviation present.  Cardiovascular: Normal rate, regular rhythm, S1 normal, S2 normal, normal heart sounds, intact distal pulses and normal pulses.   Pulmonary/Chest: Effort normal and breath sounds normal. No respiratory distress. She has no decreased breath sounds. She has no wheezes. She has no rhonchi. She has no rales.  Abdominal: Normal appearance and bowel sounds are normal. There is no tenderness.  Musculoskeletal: Normal range of motion.  Neurological: She is alert and oriented to person, place, and time.  Skin: Skin is warm and dry.  Psychiatric: She has a normal mood and affect. Her speech is normal and behavior is normal. Thought content normal.     ED Treatments / Results  Labs (all labs ordered are listed, but only abnormal results are displayed) Labs Reviewed  CBC - Abnormal; Notable for the following:       Result Value   RBC 3.71 (*)    Hemoglobin 11.5 (*)    HCT 31.9 (*)    MCHC 36.1 (*)    All other components within normal limits  BASIC METABOLIC PANEL - Abnormal; Notable for the following:    Glucose, Bld 114 (*)    BUN 29 (*)    Creatinine, Ser 1.90 (*)    GFR calc non Af Amer 26 (*)    GFR calc Af Amer 30 (*)    All other components within normal limits  TROPONIN I  TROPONIN I    EKG  EKG Interpretation None       Radiology Dg Chest 2 View  Result Date: 11/28/2016 CLINICAL DATA:  Patient with generalized chest pain for multiple days. EXAM: CHEST  2 VIEW COMPARISON:  Chest radiograph 03/11/2012 FINDINGS: Monitoring leads overlie the  patient. Stable cardiac and mediastinal contours. No consolidative pulmonary opacities. No pleural effusion or pneumothorax. Thoracic spine degenerative changes. IMPRESSION: No acute cardiopulmonary process. Electronically Signed   By: Lovey Newcomer M.D.   On: 11/28/2016 20:27    Procedures Procedures (including critical care time)  Medications Ordered in ED Medications - No data to display   Initial Impression / Assessment and Plan / ED Course  I have reviewed the triage vital signs and the nursing notes.  Pertinent labs & imaging results that were available during my  care of the patient were reviewed by me and considered in my medical decision making (see chart for details).  Final Clinical Impressions(s) / ED Diagnoses  {I have reviewed and evaluated the relevant laboratory values. {I have reviewed and evaluated the relevant imaging studies. {I have interpreted the relevant EKG. {I have reviewed the relevant previous healthcare records.  {I obtained HPI from historian. {Patient discussed with supervising physician.  ED Course:  Assessment: Pt is a 71 y.o. female with a hx of Dementia, HTN, presents to the Emergency Department today due to intermittent chest pain. First occurrence on Thursday while at rest. Seemed to go away after a few minutes. No worsening with exertion. No N/V. No diaphoresis. No trauma to area. Husband states that she has been fine the past few days until 45 min ago when she complained of chest pain again on anterior chest. Declines pain currently. This again occurred at rest. No recent travel or surgeries. No hx ACS. No FH. Risk factors for ACS include HTN. No fevers. No cough or URI symptoms. Patient is to be discharged with recommendation to follow up with PCP in regards to today's hospital visit. Chest pain is not likely of cardiac or pulmonary etiology d/t presentation, VSS, no tracheal deviation, no JVD or new murmur, RRR, breath sounds equal bilaterally, EKG without  acute abnormalities, negative troponin x2, and negative CXR. Heart Score 2. Pt has been advised to return to the ED is CP becomes exertional, associated with diaphoresis or nausea, radiates to left jaw/arm, worsens or becomes concerning in any way. Pt appears reliable for follow up and is agreeable to discharge. Patient is in no acute distress. Vital Signs are stable. Patient is able to ambulate. Patient able to tolerate PO.   Disposition/Plan:  DC Home Additional Verbal discharge instructions given and discussed with patient.  Pt Instructed to f/u with PCP in the next week for evaluation and treatment of symptoms. Return precautions given Pt acknowledges and agrees with plan  Supervising Physician Orlie Dakin, MD  Final diagnoses:  Chest pain, unspecified type    New Prescriptions New Prescriptions   No medications on file     Conni Slipper 11/28/16 2253    Orlie Dakin, MD 11/29/16 0001

## 2016-11-28 NOTE — ED Notes (Signed)
Alert, NAD, calm, interactive, resps e/u, speaking in clear complete sentences, no dyspnea noted, skin W&D, VSS, no active complaints. Family at [facility].

## 2016-11-28 NOTE — ED Notes (Signed)
EKG difficult to obtain d/t pt's confusion (slow to follow commands; needs frequent reassurance; husband supportive and at bedside).

## 2017-01-06 ENCOUNTER — Encounter: Payer: Self-pay | Admitting: Neurology

## 2017-01-06 ENCOUNTER — Ambulatory Visit (INDEPENDENT_AMBULATORY_CARE_PROVIDER_SITE_OTHER): Payer: Medicare Other | Admitting: Neurology

## 2017-01-06 VITALS — HR 75 | Wt 195.0 lb

## 2017-01-06 DIAGNOSIS — F0391 Unspecified dementia with behavioral disturbance: Secondary | ICD-10-CM | POA: Diagnosis not present

## 2017-01-06 MED ORDER — RIVASTIGMINE TARTRATE 1.5 MG PO CAPS: 1.5000 mg | ORAL_CAPSULE | Freq: Two times a day (BID) | ORAL | 6 refills | Status: DC

## 2017-01-06 NOTE — Patient Instructions (Addendum)
Remember to drink plenty of fluid, eat healthy meals and do not skip any meals. Try to eat protein with a every meal and eat a healthy snack such as fruit or nuts in between meals. Try to keep a regular sleep-wake schedule and try to exercise daily, particularly in the form of walking, 20-30 minutes a day, if you can.   As far as your medications are concerned, I would like to suggest: Exelon 1.5mg  twice daily. If side effects stop. If does well over 2-3 weeks then email me for increase (would like to get her to 9.6mg  twice a day)  I would like to see you back in 9 months, sooner if we need to. Please call us with any interim questions, concerns, problems, updates or refill requests.   Our phone number is [phone]. We also have an after hours call service for urgent matters and there is a physician on-call for urgent questions. For any emergencies you know to call 911 or go to the nearest emergency room  Rivastigmine capsules What is this medicine? RIVASTIGMINE (ri va STIG meen) is used to treat mild to moderate dementia caused by Alzheimer's disease or Parkinson's disease. This medicine may be used for other purposes; ask your health care provider or pharmacist if you have questions. COMMON BRAND NAME(S): Exelon What should I tell my health care provider before I take this medicine? They need to know if you have any of these conditions: -difficulty passing urine -heart disease, or irregular or slow heartbeat -kidney disease -liver disease -lung or breathing disease, like asthma -seizures -stomach or intestine disease, ulcers, or stomach bleeding -an unusual or allergic reaction to rivastigmine, other medicines, foods, dyes, or preservatives -pregnant or trying to get pregnant -breast-feeding How should I use this medicine? Take this medicine by mouth with a glass of water. Follow the directions on the prescription label. Take this medicine with food. Take your doses at regular  intervals. Do not take your medicine more often than directed. Do not stop taking except on the advice of your doctor or health care professional. Talk to your pediatrician regarding the use of this medicine in children. Special care may be needed. Overdosage: If you think you have taken too much of this medicine contact a poison control center or emergency room at once. NOTE: This medicine is only for you. Do not share this medicine with others. What if I miss a dose? If you miss a dose, take it as soon as you can. If it is almost time for your next dose, take only that dose. Do not take double or extra doses. What may interact with this medicine? -antihistamines for allergy, cough and cold -atropine -certain medicines for bladder problems like oxybutynin, tolterodine -certain medicines for Parkinson's disease like benztropine, trihexyphenidyl -certain medicines for stomach problems like dicyclomine, hyoscyamine -glycopyrrolate -ipratropium -certain medicines for travel sickness like scopolamine -medicines that relax your muscles for surgery -other medicines for Alzheimer's disease This list may not describe all possible interactions. Give your health care provider a list of all the medicines, herbs, non-prescription drugs, or dietary supplements you use. Also tell them if you smoke, drink alcohol, or use illegal drugs. Some items may interact with your medicine. What should I watch for while using this medicine? Visit your doctor or health care professional for regular checks on your progress. Check with your doctor or health care professional if your symptoms do not get better or if they get worse. You may get drowsy or dizzy.  Do not drive, use machinery, or do anything that needs mental alertness until you know how this drug affects you. What side effects may I notice from receiving this medicine? Side effects that you should report to your doctor or health care professional as soon as  possible: -allergic reactions like skin rash, itching or hives, swelling of the face, lips, or tongue -changes in vision or balance -feeling faint or lightheaded, falls -increase in frequency of passing urine, or incontinence -nervousness, agitation, or increased confusion -redness, blistering, peeling or loosening of the skin, including inside the mouth -severe diarrhea -slow heartbeat, or palpitations -stomach pain -sweating -uncontrollable movements -vomiting -weight loss Side effects that usually do not require medical attention (report to your doctor or health care professional if they continue or are bothersome): -headache -indigestion or heartburn -loss of appetite -mild diarrhea, especially when starting treatment -nausea This list may not describe all possible side effects. Call your doctor for medical advice about side effects. You may report side effects to FDA at 1-800-FDA-1088. Where should I keep my medicine? Keep out of reach of children. Store at room temperature between 15 and 30 degrees C (59 and 86 degrees F). Keep container tightly closed. Throw away any unused medicine after the expiration date. NOTE: This sheet is a summary. It may not cover all possible information. If you have questions about this medicine, talk to your doctor, pharmacist, or health care provider.  2018 [employer]/Gold Standard (2007-12-19 14:15:23)

## 2017-01-06 NOTE — Progress Notes (Signed)
[facility]    Provider:  Dr Jaynee Eagles Referring Provider: Shirline Frees, MD Primary Care Physician:  Shirline Frees, MD  Interval history 01/06/2017: Hallucinations worsened with prednisone and improved since stopping. Doing well on the Risperidone. She has lost weight. No wandering, no calls to police. Will try Exelon again twice a day, stop for any side effects. She is restless, may be a side effect of the Risperidal. Since adjusting medications due to low blood pressure she has not fallen, her blood pressure was very low and she was dizzy and doing better, no difficulty swallowing, some increased movements and balling up of her clothes with her hands, improved agitation. They have a caretaker that stays with her during the day. She walks around the house a lot, no wandering or calls to 911. She has lost about 40 pounds. No sleeping changes, they also have a sleeping aid to help at night.   Interval history 05/25/2016: Ms. Kendzierski is a 71 year old female with Lewy body dementia. She returns today for follow-up. The patient has tried Aricept, Namenda and Exelon but has not been able to tolerate the medication. She is on Risperdal 2mg  twice daily for hallucinations and agitation. She lives with her husband. She is crying today because she could not remember the date on the MMSE. She notices her memory loss and it is frustrating for her. Husband is using 1/2 a pill twice daily unless needed for episodes of hallucinations and agitation. Husband is home during the day and someone sits with patient when he is at work. Her hallucinations have been under good control. He has noticed prednisone can trigger hallucinations in her. She goes to bed at 7pm, she takes Trazodone at night and she sleeps all night. No falls. No dysphagia. She takes vitamin D and a daily multivitamin. She does not cook, husband sets out her clothes but she can dress herself, she helps fold clothes, showers and toilets  herself. Some confusion in the late afternoon when she says she wants to go home but is actually already at home. She plays the piano at home with assistance from husband, he is exercising her brain.  Interval update 11/27/2015: She is doing very well. She is on Risperdal 2mg  twice daily and we discussed the risks associated with these medications. He has decreased to 1 pill in the morning 2mg  and discussed trying to limit as much as possible. He may give her 1/2 a pill in the afternoon if needed. She is not having any side effects to the Rispersdal. It has helped with the delusions and hallucinations. She has not left the house recently. No falls. She has some back pain and she is on a muscle relaxer. Sleeping well, no issues at night. She takes 1/2 a trazodone at night. Husband and wife is happy. There is a book, 36-hour day. She feels like she is doing very well. Mother and grandmother had dementia. Discussed Lewy body dementia. She was a Theme park manager, [employer].   HISTORY OF PRESENT ILLNESS Ms. Cronce is a 71 year old female with Lewy body dementia. She returns today for follow-up. The patient has tried Aricept, Namenda and Exelon but has not been able to tolerate the medication. Her husband reports that she is having hallucinations again. Her hallucinations consist of seeing people in her home. The hallucinations are not violent or fearful. She will occasionally has some delusional thinking such as feeling as if her jewelry has been stolen. She also states to me when her husband  is out of the room-she feels that he is trying to divorce her. The patient and her husband feel that her memory has remained stable. She is able to complete all ADLs independently. She is currently not operating a motor vehicle primarily due to her vision. The patient is having trouble sleeping at night and her primary care started her on trazodone 50 mg at bedtime. She is been taking this for 1 week but has not seen any  benefit yet. She returns today for an evaluation.  interval update 06/12/2015: She had side effects to donepezil. She became more confused and talking in sleep. Stopping it helped. Could not tolerate the namzeric either, more confusion. Will start Rivastigmine which is better studied in lewy body dementia. No more problems with leaving the house. She is doing well, patient and husband deny hallucinations since last being see, no swallowing difficulties, no falls, no accidents. I recommend patient not drive anymore. Husband is with patient all the time except when at work and patient is at home and doing well. She does go out a lot but she has friends come pick her up and take her out. Her appetite has been good, eatong a good diet, getting out and walking, mood is good. Everything has been going well. No falls. No issues since last being seen, husband and patient endorse all is well. She is comfortably staying at her home alone as well. No delusions, no recent hallucinations.   Interval update 03/12/2015: Wife says things are fine. No incidents since the Silver Alert in September. She left in the car and was stopped by a state trooper. She is still having visual hallucinations. She also has illusions and delusions. But not like she was having before. B12 wnl. TSH wnl.   HPI: AAIRA OESTREICHER is a 71 y.o. female here as a referral from Dr. Kenton Kingfisher for forgetfullness. PMHx HTN. She says she drives, she never gets lost. She was hallucinating. She has memory loss. She thought someone was in the home. Multiple times. She see people in church. She saw cowgirls one day. Daughter and husband are here. Daughter brought her to church and her mom hugged her and didn't remember her. Aroiund the age of 60 she started repeating things. Noticed it 8 years ago. Getting worse. Kids had a meeting with the husband and wanted a workup completed. More recent memory than remote. She is more combative. She is changing a little bit.  She has glaucoma. Vision is worsening. She has a cataract developing on one eye. She was in bed one night, she was awakened . She saw a lady on the wall. She thought the headrest in the car were people. Last Thursday she thought she saw a white female in the bathroom. She is forgetting streets she has been on in the past frequently. She calls multiple people telling them she is seeing things, says people are waiting in the car. She has called multiple people to check the house. She called someone from church 25 times. No shuffling gait or tremors. Patient's mother and grandmother had dementia, started around 75. She has lost her house and her car due to mismanagement of finances. They had to stay in a hotel when they lost everything. She is not bathing. She used to be very sharp, now she is forgetting she has rental property. Slowly progressive. She doesn't remember a lot of the incidents. She is getting lost. She should not be driving anywhere.    Reviewed notes, labs  and imaging from outside physicians, which showed:  CT of the head 03/2012:personally reviewed and agree with findings below  Comparison: None.  Findings: The ventricles are normal in size, for this patient's age, and normal in configuration.  here are no parenchymal masses or mass effect. There are no areas of abnormal parenchymal attenuation. There is no evidence of a recent infarct.  There are no extra-axial masses or abnormal fluid collections.  No intracranial hemorrhage.  Right maxillary sinus mucosal thickening. The remaining visualized sinuses and mastoid air cells are clear. No skull lesion/fracture.  IMPRESSION: No intracranial abnormality. Right maxillary sinus mucosal thickening.  TSH wnl, ACE <14,   REVIEW OF SYSTEMS:Out of a complete 14 system review of symptoms, the patient complains only of the following symptoms, and all other reviewed systems are negative.  See HPI  ALLERGIES: No Known  Allergies   Social History   Social History  . Marital status: Married    Spouse name: [name]  . Number of children: 3  . Years of education: 12   Occupational History  . Not on file.   Social History Main Topics  . Smoking status: Never Smoker  . Smokeless tobacco: Never Used  . Alcohol use No  . Drug use: No  . Sexual activity: Not on file   Other Topics Concern  . Not on file   Social History Narrative   Lives at home with husband, [name].    Family History  Problem Relation Age of Onset  . Dementia Mother     Past Medical History:  Diagnosis Date  . Dementia   . Hypertension     Past Surgical History:  Procedure Laterality Date  . APPENDECTOMY    . TONSILLECTOMY    . TUBAL LIGATION      Current Outpatient Prescriptions  Medication Sig Dispense Refill  . atropine 1 % ophthalmic solution Place 1 drop into both eyes 2 (two) times daily.     . bimatoprost (LUMIGAN) 0.01 % SOLN Place 2 drops into both eyes at bedtime.     . Bromfenac Sodium (PROLENSA) 0.07 % SOLN Apply 2 drops to eye 2 (two) times daily.    . cloNIDine (CATAPRES) 0.1 MG tablet Take 0.1 mg by mouth 2 (two) times daily.    . furosemide (LASIX) 20 MG tablet Take 20 mg by mouth daily.    Marland Kitchen lisinopril (PRINIVIL,ZESTRIL) 20 MG tablet Take 20 mg by mouth daily.    . risperiDONE (RISPERDAL) 2 MG tablet Take 1 tablet (2 mg total) by mouth 2 (two) times daily. (Patient taking differently: Take 2 mg by mouth 2 (two) times daily. ) 60 tablet 11  . timolol (TIMOPTIC) 0.5 % ophthalmic solution Place 2 drops into both eyes 2 (two) times daily.  4  . traZODone (DESYREL) 150 MG tablet Take 150 mg by mouth at bedtime.     No current facility-administered medications for this visit.     Allergies as of 01/06/2017  . (No Known Allergies)    Vitals: Pulse 75   Wt 195 lb (88.5 kg)   BMI 29.65 kg/m  Last Weight:  Wt Readings from Last 1 Encounters:  01/06/17 195 lb (88.5 kg)   Last Height:   Ht  Readings from Last 1 Encounters:  11/28/16 5\' 8"  (1.727 m)    MMSE - Mini Mental State Exam 05/25/2016 07/24/2015 03/12/2015  Orientation to time 1 0 0  Orientation to Place 4 5 4   Registration 3 3 3   Attention/  Calculation 1 5 5   Recall 1 0 1  Language- name 2 objects 2 2 2   Language- repeat 1 1 1   Language- follow 3 step command 3 3 3   Language- read & follow direction 1 1 1   Write a sentence 0 1 1  Copy design 0 0 0  Total score 17 21 21      Cranial Nerves:  The pupils are equal, round, and reactive to light. Visual fields are full to finger confrontation. Extraocular movements are intact. Trigeminal sensation is intact and the muscles of mastication are normal. The face is symmetric. The palate elevates in the midline. Hearing intact. Voice is normal. Shoulder shrug is normal. The tongue has normal motion without fasciculations.    Motor Observation:  No asymmetry, no atrophy, and no involuntary movements noted. Tone:  Normal muscle tone.   Posture:  Posture is normal. normal erect   Strength:  Strength is V/V in the upper and lower limbs.      ASSESSMENT AND PLAN This is a 71 year old female who is here for at least 10 years of progressive memory loss. Her MMSE has declined from 21 to 66 and  Montreal cognitive assessment was 12 out of 30 . There are behavioral symptoms as well as hallucinations and delusions. Likely Lewy-body dementia.   1. Dementia- Lewy body dementia  The patient has not been able to tolerate Aricept, Namenda or Exelon. She was having hallucinations and some delusional thoughts however they are not violent or fearful. Still doing well on Risperdal. Also improved since stopping prednisone.  There are risks associated with antipsychotics when taken with a diagnosis of lewy body dementia. Antipsychotics is not recommended unless psychosis is disabling. These medications are not FDA approved in dementia and carry a black nbox  warning or significant morbidity and mortality. Patient's husband understands and acknowledges, patient acknowledges as well but given dementia her understanding may be limited.    Recommend no driving and 24 hour monitoring of patient Discussed in depth Lewy body dementia with patient and her husband. We'll follow clinically. Appears prednisone was worsening hallucinations, since stopped she is improved.  Continue the Risperdal 1/2 pill in the morning and 1 pill in the afternoon or evening, advised to give the lowest dose possible.  Continue the Trazodone at night (prescribed by pcp)   Today's history and physical demonstrated very substantial and measurable cognitive losses consistent with dementia. Based on the  substantial degree of impairment it is clear that she does not have the capacity to make an informed and appropriate decisions on her healthcare and finances. I do recommend that she lives in a structured setting or with 24-hour care which her husband is providing for her. It is also clear that patient does not comprehend the degree of cognitive losses but she does express frustration at her memory changes. Patient suffering from substantial cognitive impairment due to dementia. She can perform ADLs with assistance but needs help with all IADLs.  Sarina Ill, MD  [facility] [address] [place], [place] [address]  Phone [phone] Fax [phone]  A total of 15 minutes was spent face-to-face with this patient. Over half this time was spent on counseling patient on the dementia diagnosis and different diagnostic and therapeutic options available.

## 2017-01-11 ENCOUNTER — Telehealth: Payer: Self-pay | Admitting: Neurology

## 2017-01-11 NOTE — Telephone Encounter (Signed)
Called Mr Riehle back and explained that Dr Jaynee Eagles did want the patient to continue using both medications. He was concerned because the patient hadn't done very well with swallowing the capsule. I suggested the pt take it mixed in with applesauce or pudding something that would help her swallow it down. The husband stated he would try that. No further questions at this time.

## 2017-01-11 NOTE — Telephone Encounter (Signed)
Pt's husband called in request clarification on risperiDONE (RISPERDAL) 2 MG tablet 1/2 am and 1 tab at night. She was given RX for rivastigmine (EXELON) 1.5 MG capsule last week and started that last Wednesday. Does she continue to take risperiDONE (RISPERDAL) 2 MG tablet ? Please call to discuss.

## 2017-01-18 DIAGNOSIS — S0083XA Contusion of other part of head, initial encounter: Secondary | ICD-10-CM | POA: Diagnosis not present

## 2017-01-18 DIAGNOSIS — G3183 Dementia with Lewy bodies: Secondary | ICD-10-CM | POA: Diagnosis not present

## 2017-01-18 DIAGNOSIS — E441 Mild protein-calorie malnutrition: Secondary | ICD-10-CM | POA: Diagnosis not present

## 2017-01-18 DIAGNOSIS — I10 Essential (primary) hypertension: Secondary | ICD-10-CM | POA: Diagnosis not present

## 2017-01-18 DIAGNOSIS — S40022A Contusion of left upper arm, initial encounter: Secondary | ICD-10-CM | POA: Diagnosis not present

## 2017-02-04 DIAGNOSIS — H35351 Cystoid macular degeneration, right eye: Secondary | ICD-10-CM | POA: Diagnosis not present

## 2017-02-04 DIAGNOSIS — H35371 Puckering of macula, right eye: Secondary | ICD-10-CM | POA: Diagnosis not present

## 2017-02-04 DIAGNOSIS — H2013 Chronic iridocyclitis, bilateral: Secondary | ICD-10-CM | POA: Diagnosis not present

## 2017-02-04 DIAGNOSIS — H35353 Cystoid macular degeneration, bilateral: Secondary | ICD-10-CM | POA: Diagnosis not present

## 2017-02-04 DIAGNOSIS — H35373 Puckering of macula, bilateral: Secondary | ICD-10-CM | POA: Diagnosis not present

## 2017-02-04 DIAGNOSIS — H35352 Cystoid macular degeneration, left eye: Secondary | ICD-10-CM | POA: Diagnosis not present

## 2017-02-04 DIAGNOSIS — H35372 Puckering of macula, left eye: Secondary | ICD-10-CM | POA: Diagnosis not present

## 2017-02-07 ENCOUNTER — Other Ambulatory Visit: Payer: Self-pay | Admitting: Neurology

## 2017-02-09 ENCOUNTER — Other Ambulatory Visit: Payer: Self-pay | Admitting: Neurology

## 2017-04-02 ENCOUNTER — Other Ambulatory Visit: Payer: Self-pay | Admitting: Neurology

## 2017-04-30 ENCOUNTER — Encounter: Payer: Self-pay | Admitting: Podiatry

## 2017-04-30 ENCOUNTER — Ambulatory Visit (INDEPENDENT_AMBULATORY_CARE_PROVIDER_SITE_OTHER): Payer: Medicare Other | Admitting: Podiatry

## 2017-04-30 DIAGNOSIS — R413 Other amnesia: Secondary | ICD-10-CM | POA: Insufficient documentation

## 2017-04-30 DIAGNOSIS — B351 Tinea unguium: Secondary | ICD-10-CM

## 2017-04-30 DIAGNOSIS — M79609 Pain in unspecified limb: Secondary | ICD-10-CM

## 2017-04-30 DIAGNOSIS — D869 Sarcoidosis, unspecified: Secondary | ICD-10-CM | POA: Insufficient documentation

## 2017-04-30 DIAGNOSIS — M1990 Unspecified osteoarthritis, unspecified site: Secondary | ICD-10-CM | POA: Insufficient documentation

## 2017-04-30 NOTE — Progress Notes (Signed)
  Subjective:  Patient ID: Tanya Jensen, female    DOB: 10/11/1945,  MRN: [id number]  Chief Complaint  Patient presents with  . Debridement    Requesting nail care and callus trim - tender walking   71 y.o. female presents with the above complaint.  Reports elongated nails and callus areas.  States that she has pain when she walks.  Denies diabetes. Past Medical History:  Diagnosis Date  . Dementia   . Hypertension    Past Surgical History:  Procedure Laterality Date  . APPENDECTOMY    . TONSILLECTOMY    . TUBAL LIGATION      Current Outpatient Medications:  .  atropine 1 % ophthalmic solution, Place 1 drop into both eyes 2 (two) times daily. , Disp: , Rfl:  .  bimatoprost (LUMIGAN) 0.01 % SOLN, Place 2 drops into both eyes at bedtime. , Disp: , Rfl:  .  Bromfenac Sodium (PROLENSA) 0.07 % SOLN, Apply 2 drops to eye 2 (two) times daily., Disp: , Rfl:  .  cloNIDine (CATAPRES) 0.1 MG tablet, Take 0.1 mg by mouth 2 (two) times daily., Disp: , Rfl:  .  fluorometholone (FML) 0.1 % ophthalmic suspension, INT 1 GTT INTO OU QID, Disp: , Rfl: 5 .  furosemide (LASIX) 20 MG tablet, Take 20 mg by mouth daily., Disp: , Rfl:  .  latanoprost (XALATAN) 0.005 % ophthalmic solution, INT 1 GTT INTO OS HS, Disp: , Rfl: 5 .  lisinopril (PRINIVIL,ZESTRIL) 20 MG tablet, Take 20 mg by mouth daily., Disp: , Rfl:  .  risperiDONE (RISPERDAL) 2 MG tablet, TAKE 1/2 TABLET BY MOUTH IN THE MORNING AND 1 TABLET IN THE EVENING, Disp: 45 tablet, Rfl: 5 .  rivastigmine (EXELON) 1.5 MG capsule, Take 1 capsule (1.5 mg total) by mouth 2 (two) times daily., Disp: 60 capsule, Rfl: 6 .  SIMBRINZA 1-0.2 % SUSP, INT 1 GTT INTO OU BID, Disp: , Rfl: 3 .  timolol (TIMOPTIC) 0.5 % ophthalmic solution, Place 2 drops into both eyes 2 (two) times daily., Disp: , Rfl: 4 .  traZODone (DESYREL) 150 MG tablet, Take 150 mg by mouth at bedtime., Disp: , Rfl:   No Known Allergies Review of Systems  All other systems reviewed and are  negative.  Objective:  There were no vitals filed for this visit. General AA&O x3. Normal mood and affect.  Vascular Dorsalis pedis and posterior tibial pulses  present 2+ bilaterally  Capillary refill normal to all digits. Pedal hair growth normal.  Neurologic Epicritic sensation grossly present.  Dermatologic No open lesions. Interspaces clear of maceration. Long and thickened with subungual debris, yellow discoloration, pain to palpation. Right great toe hallux IPJ soft corn  Orthopedic: MMT 5/5 in dorsiflexion, plantarflexion, inversion, and eversion. Normal joint ROM without pain or crepitus.   Assessment & Plan:  Patient was evaluated and treated and all questions answered.  Onychomycosis with pain -Nails palliatively debrided. -Educated on proper self-care -Follow-up PRN

## 2017-06-03 DIAGNOSIS — H2013 Chronic iridocyclitis, bilateral: Secondary | ICD-10-CM | POA: Diagnosis not present

## 2017-09-02 ENCOUNTER — Ambulatory Visit: Payer: Medicare Other | Admitting: Podiatry

## 2017-09-03 ENCOUNTER — Ambulatory Visit: Payer: Medicare Other | Admitting: Podiatry

## 2017-09-16 ENCOUNTER — Ambulatory Visit (INDEPENDENT_AMBULATORY_CARE_PROVIDER_SITE_OTHER): Payer: Medicare Other | Admitting: Podiatry

## 2017-09-16 ENCOUNTER — Encounter: Payer: Self-pay | Admitting: Podiatry

## 2017-09-16 DIAGNOSIS — M79609 Pain in unspecified limb: Secondary | ICD-10-CM | POA: Diagnosis not present

## 2017-09-16 DIAGNOSIS — B351 Tinea unguium: Secondary | ICD-10-CM

## 2017-09-16 DIAGNOSIS — H2013 Chronic iridocyclitis, bilateral: Secondary | ICD-10-CM | POA: Diagnosis not present

## 2017-09-28 ENCOUNTER — Other Ambulatory Visit: Payer: Self-pay | Admitting: Neurology

## 2017-10-06 ENCOUNTER — Ambulatory Visit (INDEPENDENT_AMBULATORY_CARE_PROVIDER_SITE_OTHER): Payer: Medicare Other | Admitting: Neurology

## 2017-10-06 ENCOUNTER — Encounter: Payer: Self-pay | Admitting: Neurology

## 2017-10-06 VITALS — HR 68 | Ht 68.0 in | Wt 156.0 lb

## 2017-10-06 DIAGNOSIS — G3183 Dementia with Lewy bodies: Secondary | ICD-10-CM | POA: Diagnosis not present

## 2017-10-06 DIAGNOSIS — F0280 Dementia in other diseases classified elsewhere without behavioral disturbance: Secondary | ICD-10-CM | POA: Diagnosis not present

## 2017-10-06 MED ORDER — LORAZEPAM 0.5 MG PO TABS: ORAL_TABLET | ORAL | 4 refills | Status: DC

## 2017-10-06 NOTE — Patient Instructions (Addendum)
[place]:  For adult daycare [facility] [place] [phone]  The 36-hour day - wonderful book on being a caretaker of someone with dementia  [place] [address]  [place], [place] [address] 10:00 AM Gwendolyn Fill, 2nd Thursday of each month Please RSVP with Christianna Lovena Le, [facility] Caregiver Support Coordinator [phone] or [email or web address]   PACE program    Lorazepam tablets What is this medicine? LORAZEPAM (lor A ze pam) is a benzodiazepine. It is used to treat anxiety. This medicine may be used for other purposes; ask your health care provider or pharmacist if you have questions. COMMON BRAND NAME(S): Ativan What should I tell my health care provider before I take this medicine? They need to know if you have any of these conditions: -glaucoma -history of drug or alcohol abuse problem -kidney disease -liver disease -lung or breathing disease, like asthma -mental illness -myasthenia gravis -Parkinson's disease -suicidal thoughts, plans, or attempt; a previous suicide attempt by you or a family member -an unusual or allergic reaction to lorazepam, other medicines, foods, dyes, or preservatives -pregnant or trying to get pregnant -breast-feeding How should I use this medicine? Take this medicine by mouth with a glass of water. Follow the directions on the prescription label. Take your medicine at regular intervals. Do not take it more often than directed. Do not stop taking except on your doctor's advice. A special MedGuide will be given to you by the pharmacist with each prescription and refill. Be sure to read this information carefully each time. Talk to your pediatrician regarding the use of this medicine in children. While this drug may be used in children as young as 12 years for selected conditions, precautions do apply. Overdosage: If you think you have taken too much of this medicine contact a poison  control center or emergency room at once. NOTE: This medicine is only for you. Do not share this medicine with others. What if I miss a dose? If you miss a dose, take it as soon as you can. If it is almost time for your next dose, take only that dose. Do not take double or extra doses. What may interact with this medicine? Do not take this medicine with any of the following medications: -narcotic medicines for cough -sodium oxybate This medicine may also interact with the following medications: -alcohol -antihistamines for allergy, cough and cold -certain medicines for anxiety or sleep -certain medicines for depression, like amitriptyline, fluoxetine, sertraline -certain medicines for seizures like carbamazepine, phenobarbital, phenytoin, primidone -general anesthetics like lidocaine, pramoxine, tetracaine -MAOIs like Carbex, Eldepryl, Marplan, Nardil, and Parnate -medicines that relax muscles for surgery -narcotic medicines for pain -phenothiazines like chlorpromazine, mesoridazine, prochlorperazine, thioridazine This list may not describe all possible interactions. Give your health care provider a list of all the medicines, herbs, non-prescription drugs, or dietary supplements you use. Also tell them if you smoke, drink alcohol, or use illegal drugs. Some items may interact with your medicine. What should I watch for while using this medicine? Tell your doctor or health care professional if your symptoms do not start to get better or if they get worse. Do not stop taking except on your doctor's advice. You may develop a severe reaction. Your doctor will tell you how much medicine to take. You may get drowsy or dizzy. Do not drive, use machinery, or do anything that needs mental alertness until you know how this medicine affects you. To reduce the risk of dizzy and fainting spells,  do not stand or sit up quickly, especially if you are an older patient. Alcohol may increase dizziness and  drowsiness. Avoid alcoholic drinks. If you are taking another medicine that also causes drowsiness, you may have more side effects. Give your health care provider a list of all medicines you use. Your doctor will tell you how much medicine to take. Do not take more medicine than directed. Call emergency for help if you have problems breathing or unusual sleepiness. What side effects may I notice from receiving this medicine? Side effects that you should report to your doctor or health care professional as soon as possible: -allergic reactions like skin rash, itching or hives, swelling of the face, lips, or tongue -breathing problems -confusion -loss of balance or coordination -signs and symptoms of low blood pressure like dizziness; feeling faint or lightheaded, falls; unusually weak or tired -suicidal thoughts or other mood changes Side effects that usually do not require medical attention (report to your doctor or health care professional if they continue or are bothersome): -dizziness -headache -nausea, vomiting -tiredness This list may not describe all possible side effects. Call your doctor for medical advice about side effects. You may report side effects to FDA at 1-800-FDA-1088. Where should I keep my medicine? Keep out of the reach of children. This medicine can be abused. Keep your medicine in a safe place to protect it from theft. Do not share this medicine with anyone. Selling or giving away this medicine is dangerous and against the law. This medicine may cause accidental overdose and death if taken by other adults, children, or pets. Mix any unused medicine with a substance like cat litter or coffee grounds. Then throw the medicine away in a sealed container like a sealed bag or a coffee can with a lid. Do not use the medicine after the expiration date. Store at room temperature between 20 and 25 degrees C (68 and 77 degrees F). Protect from light. Keep container tightly  closed. NOTE: This sheet is a summary. It may not cover all possible information. If you have questions about this medicine, talk to your doctor, pharmacist, or health care provider.  2018 Elsevier/Gold Standard (2015-01-17 15:54:27)

## 2017-10-06 NOTE — Progress Notes (Signed)
Faxed Ativan prescription to pharmacy. Registry checked by Elmyra Ricks.
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
## 2017-10-27 ENCOUNTER — Other Ambulatory Visit: Payer: Self-pay | Admitting: Neurology

## 2017-11-03 ENCOUNTER — Telehealth: Payer: Self-pay | Admitting: Podiatry

## 2017-11-03 NOTE — Telephone Encounter (Signed)
Pts husband called asking about a product we sell that would dissolve calluses. Can you please call pt with this information. Pt is scheduled to see Dr March Rummage on Monday.

## 2017-11-03 NOTE — Telephone Encounter (Signed)
Pt's husband, [name] states at pt's last visit doctor had said there was a cream in the office that could be put on the callouses to decrease the return. I informed pt of Revitaderm40 $22.00 for 4 oz daily use and would need to have the callouses trimmed, but in most cases slowed the process of regrowth.

## 2017-11-08 ENCOUNTER — Ambulatory Visit (INDEPENDENT_AMBULATORY_CARE_PROVIDER_SITE_OTHER): Payer: Medicare Other | Admitting: Podiatry

## 2017-11-08 DIAGNOSIS — M79609 Pain in unspecified limb: Principal | ICD-10-CM

## 2017-11-08 DIAGNOSIS — M79676 Pain in unspecified toe(s): Secondary | ICD-10-CM

## 2017-11-08 DIAGNOSIS — B351 Tinea unguium: Secondary | ICD-10-CM | POA: Diagnosis not present

## 2017-11-08 DIAGNOSIS — Q828 Other specified congenital malformations of skin: Secondary | ICD-10-CM | POA: Diagnosis not present

## 2017-11-08 NOTE — Progress Notes (Signed)
  Subjective:  Patient ID: Tanya Jensen, female    DOB: 29-Dec-1945,  MRN: [id number]  No chief complaint on file.  72 y.o. female returns for the above complaint.  Reports pain from her nails and calluses   Objective:  There were no vitals filed for this visit. General AA&O x3. Normal mood and affect.  Vascular Pedal pulses palpable.  Neurologic Epicritic sensation grossly intact.  Dermatologic No open lesions. Skin normal texture and turgor. Toenails x 10 elongated, thickened, dystrophic. HPK L 5th MPJ  Orthopedic: Pain to palpation about the toenails. POP L 5th MPJ   Assessment & Plan:  Patient was evaluated and treated and all questions answered.  Onychomycosis with pain  -Nails palliatively debrided as below. -Educated on self-care  Procedure: Nail Debridement Rationale: pain  Type of Debridement: manual, sharp debridement. Instrumentation: Nail nipper, rotary burr. Number of Nails: 10  Porokeratosis L -Debridement with 312 blade.   Return if symptoms worsen or fail to improve.

## 2017-11-23 NOTE — Progress Notes (Signed)
  Subjective:  Patient ID: Tanya Jensen, female    DOB: January 07, 1946,  MRN: [id number]  Chief Complaint  Patient presents with  . Debridement    Trim toenails  . Callouses    Trim calluses   72 y.o. female returns for the above complaint.  Reports pain to the toenails.  Objective:  There were no vitals filed for this visit. General AA&O x3. Normal mood and affect.  Vascular Pedal pulses palpable.  Neurologic Epicritic sensation grossly intact.  Dermatologic No open lesions. Skin normal texture and turgor. Toenails x 10 elongated, thickened, dystrophic.  Orthopedic: Pain to palpation about the toenails.   Assessment & Plan:  Patient was evaluated and treated and all questions answered.  Onychomycosis with pain  -Nails palliatively debrided as below. -Educated on self-care  Procedure: Nail Debridement Rationale: pain  Type of Debridement: manual, sharp debridement. Instrumentation: Nail nipper, rotary burr. Number of Nails: 10     Return if symptoms worsen or fail to improve.

## 2018-02-09 DIAGNOSIS — H2013 Chronic iridocyclitis, bilateral: Secondary | ICD-10-CM | POA: Diagnosis not present

## 2018-02-09 DIAGNOSIS — H35372 Puckering of macula, left eye: Secondary | ICD-10-CM | POA: Diagnosis not present

## 2018-02-09 DIAGNOSIS — H401134 Primary open-angle glaucoma, bilateral, indeterminate stage: Secondary | ICD-10-CM | POA: Diagnosis not present

## 2018-02-09 DIAGNOSIS — H3581 Retinal edema: Secondary | ICD-10-CM | POA: Diagnosis not present

## 2018-03-03 ENCOUNTER — Other Ambulatory Visit: Payer: Self-pay

## 2018-03-03 ENCOUNTER — Emergency Department (HOSPITAL_COMMUNITY): Payer: Medicare Other

## 2018-03-03 ENCOUNTER — Encounter (HOSPITAL_COMMUNITY): Payer: Self-pay | Admitting: Emergency Medicine

## 2018-03-03 ENCOUNTER — Emergency Department (HOSPITAL_COMMUNITY)
Admission: EM | Admit: 2018-03-03 | Discharge: 2018-03-04 | Disposition: A | Discharge status: Home / Self Care | Payer: Medicare Other | Attending: Emergency Medicine | Admitting: Emergency Medicine

## 2018-03-03 DIAGNOSIS — S80211A Abrasion, right knee, initial encounter: Secondary | ICD-10-CM | POA: Diagnosis not present

## 2018-03-03 DIAGNOSIS — Z79899 Other long term (current) drug therapy: Secondary | ICD-10-CM | POA: Insufficient documentation

## 2018-03-03 DIAGNOSIS — S80212A Abrasion, left knee, initial encounter: Secondary | ICD-10-CM | POA: Insufficient documentation

## 2018-03-03 DIAGNOSIS — G3183 Dementia with Lewy bodies: Secondary | ICD-10-CM | POA: Diagnosis not present

## 2018-03-03 DIAGNOSIS — N390 Urinary tract infection, site not specified: Secondary | ICD-10-CM | POA: Insufficient documentation

## 2018-03-03 DIAGNOSIS — S8991XA Unspecified injury of right lower leg, initial encounter: Secondary | ICD-10-CM | POA: Diagnosis not present

## 2018-03-03 DIAGNOSIS — Y999 Unspecified external cause status: Secondary | ICD-10-CM | POA: Insufficient documentation

## 2018-03-03 DIAGNOSIS — R55 Syncope and collapse: Secondary | ICD-10-CM | POA: Diagnosis not present

## 2018-03-03 DIAGNOSIS — W1830XA Fall on same level, unspecified, initial encounter: Secondary | ICD-10-CM | POA: Diagnosis not present

## 2018-03-03 DIAGNOSIS — R404 Transient alteration of awareness: Secondary | ICD-10-CM | POA: Diagnosis not present

## 2018-03-03 DIAGNOSIS — S8992XA Unspecified injury of left lower leg, initial encounter: Secondary | ICD-10-CM | POA: Diagnosis not present

## 2018-03-03 DIAGNOSIS — I10 Essential (primary) hypertension: Secondary | ICD-10-CM | POA: Insufficient documentation

## 2018-03-03 DIAGNOSIS — Y939 Activity, unspecified: Secondary | ICD-10-CM | POA: Insufficient documentation

## 2018-03-03 DIAGNOSIS — M25562 Pain in left knee: Secondary | ICD-10-CM | POA: Diagnosis not present

## 2018-03-03 DIAGNOSIS — Y929 Unspecified place or not applicable: Secondary | ICD-10-CM | POA: Insufficient documentation

## 2018-03-03 DIAGNOSIS — M25561 Pain in right knee: Secondary | ICD-10-CM | POA: Diagnosis not present

## 2018-03-03 DIAGNOSIS — R457 State of emotional shock and stress, unspecified: Secondary | ICD-10-CM | POA: Diagnosis not present

## 2018-03-03 HISTORY — DX: Urinary tract infection, site not specified: N39.0

## 2018-03-03 LAB — CBC
HCT: 33.9 % — ABNORMAL LOW (ref 36.0–46.0)
Hemoglobin: 11.3 g/dL — ABNORMAL LOW (ref 12.0–15.0)
MCH: 30.1 pg (ref 26.0–34.0)
MCHC: 33.3 g/dL (ref 30.0–36.0)
MCV: 90.2 fL (ref 80.0–100.0)
PLATELETS: 159 10*3/uL (ref 150–400)
RBC: 3.76 MIL/uL — ABNORMAL LOW (ref 3.87–5.11)
RDW: 15.7 % — AB (ref 11.5–15.5)
WBC: 8.4 10*3/uL (ref 4.0–10.5)
nRBC: 0 % (ref 0.0–0.2)

## 2018-03-03 LAB — COMPREHENSIVE METABOLIC PANEL
ALT: 15 U/L (ref 0–44)
AST: 17 U/L (ref 15–41)
Albumin: 4.1 g/dL (ref 3.5–5.0)
Alkaline Phosphatase: 70 U/L (ref 38–126)
Anion gap: 7 (ref 5–15)
BUN: 26 mg/dL — ABNORMAL HIGH (ref 8–23)
CO2: 23 mmol/L (ref 22–32)
Calcium: 9.5 mg/dL (ref 8.9–10.3)
Chloride: 112 mmol/L — ABNORMAL HIGH (ref 98–111)
Creatinine, Ser: 1.14 mg/dL — ABNORMAL HIGH (ref 0.44–1.00)
GFR calc Af Amer: 55 mL/min — ABNORMAL LOW (ref >60)
GFR calc non Af Amer: 47 mL/min — ABNORMAL LOW (ref >60)
Glucose, Bld: 109 mg/dL — ABNORMAL HIGH (ref 70–99)
Potassium: 3.9 mmol/L (ref 3.5–5.1)
Sodium: 142 mmol/L (ref 135–145)
Total Bilirubin: 0.6 mg/dL (ref 0.3–1.2)
Total Protein: 7 g/dL (ref 6.5–8.1)

## 2018-03-03 LAB — I-STAT TROPONIN, ED: Troponin i, poc: 0.01 ng/mL (ref 0.00–0.08)

## 2018-03-03 NOTE — ED Notes (Signed)
Patient transported to X-ray 

## 2018-03-03 NOTE — ED Notes (Signed)
Bed: TG90 Expected date:  Expected time:  Means of arrival:  Comments: EMS 72 yo female near syncope after getting out of recliner-BP meds have been readjusted by primary
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
## 2018-03-03 NOTE — ED Provider Notes (Signed)
[facility] DEPT Provider Note   CSN: [id number] Arrival date & time: 03/03/18  2111     History   Chief Complaint Chief Complaint  Patient presents with  . Near Syncope    HPI Tanya Jensen is a 72 y.o. female with history of hypertension, Lewy body dementia with behavioral disturbance, sarcoidosis who presents following a syncopal episode.  Patient's husband reports that they were out for a drive, which they normally do, and patient went to get out of the car when she grabbed her lower abdomen as well as her chest and dropped to the ground.  Her husband caught her and she did not hit her head.  She did hit her knees on the ground.  She sustained abrasions to her left knee.  Patient came back to baseline fairly quickly and was complaining of left knee pain.  Patient's husband reports that patient had a bowel movement during/after the episode and he feels like maybe she had gas pains.  Per family, patient's blood pressure medications have been changed recently by her PCP. This has been going on since April but she did start taking Harataki, a natural supplement, recently.  Family believes tetanus is up-to-date.  HPI  Past Medical History:  Diagnosis Date  . Dementia ([place])   . Hypertension     Patient Active Problem List   Diagnosis Date Noted  . Amnesia 04/30/2017  . Arthritis, degenerative 04/30/2017  . Sarcoidosis 04/30/2017  . Pseudophakia of left eye 05/17/2015  . Age-related nuclear cataract of right eye 05/02/2015  . Lewy body dementia with behavioral disturbance ([place]) 03/19/2015  . Dementia with behavioral disturbance ([place]) 01/16/2015  . Delusions ([place]) 01/16/2015  . Hallucinations 01/16/2015  . Dry eye syndrome of left lacrimal gland 03/23/2014  . Chronic iridocyclitis, bilateral 03/19/2014  . Postsurgical states following surgery of eye and adnexa 02/23/2014  . NS (nuclear sclerosis), right 02/07/2014  . Open-angle glaucoma, severe  stage 02/07/2014  . Essential (primary) hypertension 01/23/2013  . Foot pain 01/23/2013    Past Surgical History:  Procedure Laterality Date  . APPENDECTOMY    . TONSILLECTOMY    . TUBAL LIGATION       OB History   None      Home Medications    Prior to Admission medications   Medication Sig Start Date End Date Taking? Authorizing Provider  atropine 1 % ophthalmic solution Place 1 drop into both eyes daily.    Yes [provider]  bimatoprost (LUMIGAN) 0.03 % ophthalmic solution Place 1 drop into both eyes at bedtime.   Yes [provider]  brimonidine (ALPHAGAN) 0.2 % ophthalmic solution Place 1 drop into both eyes 2 (two) times daily. 02/16/18  Yes [provider]  Bromfenac Sodium (PROLENSA) 0.07 % SOLN Apply 1 drop to eye 2 (two) times daily.    Yes [provider]  cloNIDine (CATAPRES) 0.1 MG tablet Take 0.05 mg by mouth 2 (two) times daily.    Yes [provider]  dorzolamide-timolol (COSOPT) 22.3-6.8 MG/ML ophthalmic solution Place 1 drop into both eyes 2 (two) times daily.   Yes [provider]  fluorometholone (FML) 0.1 % ophthalmic suspension Place 1 drop into both eyes 2 (two) times daily.  04/18/17  Yes [provider]  lisinopril (PRINIVIL,ZESTRIL) 20 MG tablet Take 20 mg by mouth daily.   Yes [provider]  OVER THE COUNTER MEDICATION Take 1 tablet by mouth 2 (two) times daily. [facility]*  Yes [provider]  OVER THE COUNTER MEDICATION Take 1 tablet by mouth daily. *haritaki*   Yes [provider]  OVER THE COUNTER MEDICATION Take 1 tablet by mouth daily. *Memory Supplement*   Yes [provider]  traZODone (DESYREL) 150 MG tablet Take 150 mg by mouth at bedtime.   Yes [provider]  cephALEXin (KEFLEX) 500 MG capsule Take 1 capsule (500 mg total) by mouth 2 (two) times daily. 03/04/18   Iria Jamerson, Bea Graff, PA-C  LORazepam (ATIVAN) 0.5 MG tablet Take 1/2  pill to a whole pill every 8 hours for agitation. Patient not taking: Reported on 03/04/2018 10/06/17   Melvenia Beam, MD    Family History Family History  Problem Relation Age of Onset  . Dementia Mother     Social History Social History   Tobacco Use  . Smoking status: Never Smoker  . Smokeless tobacco: Never Used  Substance Use Topics  . Alcohol use: No  . Drug use: No     Allergies   Patient has no known allergies.   Review of Systems Review of Systems  Unable to perform ROS: Dementia     Physical Exam Updated Vital Signs BP 125/75   Pulse 72   Temp (!) 97.4 F (36.3 C)   Resp 15   SpO2 100%   Physical Exam  Constitutional: She appears well-developed and well-nourished. No distress.  HENT:  Head: Normocephalic and atraumatic.  Mouth/Throat: Oropharynx is clear and moist. No oropharyngeal exudate.  Eyes: Pupils are equal, round, and reactive to light. Conjunctivae are normal. Right eye exhibits no discharge. Left eye exhibits no discharge. No scleral icterus.  Neck: Normal range of motion. Neck supple. No thyromegaly present.  Cardiovascular: Normal rate, regular rhythm, normal heart sounds and intact distal pulses. Exam reveals no gallop and no friction rub.  No murmur heard. Pulmonary/Chest: Effort normal and breath sounds normal. No stridor. No respiratory distress. She has no wheezes. She has no rales.  Abdominal: Soft. Bowel sounds are normal. She exhibits no distension. There is no tenderness. There is no rebound and no guarding.  Musculoskeletal: She exhibits no edema.  Superficial abrasions and tenderness to L knee Very superficial abrasions to R knee without tenderness  Lymphadenopathy:    She has no cervical adenopathy.  Neurological: She is alert. Coordination normal.  Patient with difficulty following commands and easily agitated, this is baseline per family  Skin: Skin is warm and dry. No rash noted. She is not diaphoretic. No pallor.    Psychiatric: She has a normal mood and affect.  Nursing note and vitals reviewed.    ED Treatments / Results  Labs (all labs ordered are listed, but only abnormal results are displayed) Labs Reviewed  CBC - Abnormal; Notable for the following components:      Result Value   RBC 3.76 (*)    Hemoglobin 11.3 (*)    HCT 33.9 (*)    RDW 15.7 (*)    All other components within normal limits  URINALYSIS, ROUTINE W REFLEX MICROSCOPIC - Abnormal; Notable for the following components:   Hgb urine dipstick SMALL (*)    Nitrite POSITIVE (*)    Leukocytes, UA TRACE (*)    Bacteria, UA MANY (*)    All other components within normal limits  COMPREHENSIVE METABOLIC PANEL - Abnormal; Notable for the following components:   Chloride 112 (*)    Glucose, Bld 109 (*)    BUN 26 (*)  Creatinine, Ser 1.14 (*)    GFR calc non Af Amer 47 (*)    GFR calc Af Amer 55 (*)    All other components within normal limits  URINE CULTURE  I-STAT TROPONIN, ED    EKG EKG Interpretation  Date/Time:  Thursday March 03 2018 21:37:22 EDT Ventricular Rate:  54 PR Interval:    QRS Duration: 104 QT Interval:  434 QTC Calculation: 412 R Axis:   -29 Text Interpretation:  Sinus rhythm Borderline left axis deviation No significant change since last tracing Confirmed by Isla Pence [phone]) on 03/03/2018 10:09:07 PM   Radiology Dg Knee Complete 4 Views Left  Result Date: 03/03/2018 CLINICAL DATA:  72 year old female with fall and left knee pain. EXAM: LEFT KNEE - COMPLETE 4+ VIEW COMPARISON:  None. FINDINGS: There is no acute fracture or dislocation. There is advanced osteoarthritic changes with tricompartmental narrowing primarily involving the lateral compartment and bone spurring. No significant joint effusion. The soft tissues are unremarkable. IMPRESSION: 1. No acute fracture or dislocation. 2. Advanced osteoarthritic changes. Electronically Signed   By: Anner Crete M.D.   On: 03/03/2018 23:48    Dg Knee Complete 4 Views Right  Result Date: 03/03/2018 CLINICAL DATA:  72 year old female with fall and right knee pain. EXAM: RIGHT KNEE - COMPLETE 4+ VIEW COMPARISON:  Right knee radiograph dated 10/16/2014 FINDINGS: There is no acute fracture or dislocation. There is advanced arthritic changes with tricompartmental narrowing and bone spurring. There is moderate suprapatellar effusion. The soft tissues are unremarkable. IMPRESSION: 1. No acute fracture or dislocation. 2. Advanced arthritic changes. 3. Moderate suprapatellar effusion. Electronically Signed   By: Anner Crete M.D.   On: 03/03/2018 23:47    Procedures Procedures (including critical care time)  Medications Ordered in ED Medications - No data to display   Initial Impression / Assessment and Plan / ED Course  I have reviewed the triage vital signs and the nursing notes.  Pertinent labs & imaging results that were available during my care of the patient were reviewed by me and considered in my medical decision making (see chart for details).     Patient presenting after syncopal episode.  She is suspected to have a gas pain prior and a bowel movement right after.  Her labs are stable.  She does have a urinary tract infection today.  She will be treated with Keflex.  This could be contributory.  Urine culture will be sent.  X-rays of the knees show moderate suprapatellar effusion on the right knee with advanced arthritic changes bilaterally.  No acute fracture dislocation bilaterally.  We will treat supportively with knee sleeve, ice.  Patient advised to follow-up with PCP.  Patient's husband understands and agrees with plan.  Patient vitals stable throughout ED course and discharged in satisfactory condition.  Final Clinical Impressions(s) / ED Diagnoses   Final diagnoses:  Lower urinary tract infectious disease    ED Discharge Orders         Ordered    cephALEXin (KEFLEX) 500 MG capsule  2 times daily     03/04/18  0245           Frederica Kuster, PA-C 03/04/18 0730    Isla Pence, MD 03/04/18 1512

## 2018-03-04 DIAGNOSIS — N390 Urinary tract infection, site not specified: Secondary | ICD-10-CM | POA: Diagnosis not present

## 2018-03-04 LAB — URINALYSIS, ROUTINE W REFLEX MICROSCOPIC
Bilirubin Urine: NEGATIVE
Glucose, UA: NEGATIVE mg/dL
Ketones, ur: NEGATIVE mg/dL
Nitrite: POSITIVE — AB
PH: 6 (ref 5.0–8.0)
Protein, ur: NEGATIVE mg/dL
SPECIFIC GRAVITY, URINE: 1.012 (ref 1.005–1.030)

## 2018-03-04 MED ORDER — CEPHALEXIN 500 MG PO CAPS: 500.0000 mg | ORAL_CAPSULE | Freq: Two times a day (BID) | ORAL | 0 refills | Status: DC

## 2018-03-04 NOTE — Discharge Instructions (Addendum)
Take Keflex 2 times daily for 7 days for urinary tract infection.  A urine culture will be sent and you will be called if a change in antibiotic is necessary.  Please follow-up with PCP for recheck on Monday.  Please return the emergency department if you develop any new or worsening symptoms.

## 2018-03-04 NOTE — ED Notes (Signed)
Spoke with lab, lab states able to add on culture to grey tube in lab

## 2018-03-06 LAB — URINE CULTURE: Culture: >=100000 — AB

## 2018-03-07 ENCOUNTER — Telehealth: Payer: Self-pay | Admitting: Emergency Medicine

## 2018-03-07 NOTE — Telephone Encounter (Signed)
Post ED Visit - Positive Culture Follow-up  Culture report reviewed by antimicrobial stewardship pharmacist:  []  Elenor Quinones, Pharm.D. []  Heide Guile, Pharm.D., BCPS AQ-ID []  Parks Neptune, Pharm.D., BCPS []  Alycia Rossetti, Pharm.D., BCPS []  [place], Pharm.D., BCPS, AAHIVP []  Legrand Como, Pharm.D., BCPS, AAHIVP []  Salome Arnt, PharmD, BCPS []  Johnnette Gourd, PharmD, BCPS [x]  Hughes Better, PharmD, BCPS []  Leeroy Cha, PharmD  Positive urine culture Treated with cephalexin, organism sensitive to the same and no further patient follow-up is required at this time.  Hazle Nordmann 03/07/2018, 9:31 AM

## 2018-04-07 ENCOUNTER — Encounter: Payer: Self-pay | Admitting: Neurology

## 2018-04-07 ENCOUNTER — Ambulatory Visit (INDEPENDENT_AMBULATORY_CARE_PROVIDER_SITE_OTHER): Payer: Medicare Other | Admitting: Neurology

## 2018-04-07 VITALS — BP 90/62 | HR 68 | Ht 65.0 in | Wt 140.0 lb

## 2018-04-07 DIAGNOSIS — E7211 Homocystinuria: Secondary | ICD-10-CM

## 2018-04-07 DIAGNOSIS — R7989 Other specified abnormal findings of blood chemistry: Secondary | ICD-10-CM

## 2018-04-07 DIAGNOSIS — F0391 Unspecified dementia with behavioral disturbance: Secondary | ICD-10-CM

## 2018-04-07 DIAGNOSIS — E538 Deficiency of other specified B group vitamins: Secondary | ICD-10-CM | POA: Diagnosis not present

## 2018-04-07 DIAGNOSIS — R634 Abnormal weight loss: Secondary | ICD-10-CM

## 2018-04-11 NOTE — Progress Notes (Signed)
[facility]    Provider:  Dr Jaynee Eagles Referring Provider: Shirline Frees, MD Primary Care Physician:  Shirline Frees, MD   Interval history: Patient here for follow-up of dementia.  She has been doing well, hallucinations do not seem to be a problem anymore, she continues to decline, did not tolerate Aricept or Namenda, her sister is here and she drove 4 hours from [place].  Also here with her husband.  She has been having increased weight loss yet she is eating quite well.  Today we will check labs such as thyroid.  We will also check other dementia labs again today.  Discussed computers down they will come back to have the labs drawn.  Unclear etiology of significant weight loss.  She otherwise appears quite healthy however.  Interval history 10/06/2017: Here for follow up on Lewy Body Dementia. Significant decline today MMSE 8/30. The family feels she is better. She is on Trazodone at night prescribed by pcp for sleep and also helps with mood. She takes Risperdal for hallucinations/delusions. Her BP is low today and she is on Clonidine however asymptomatic. She has been trying to lose weight. We will stop the Risperdal. He gives it to her every other day, will stop doesn't appear she needs it if every other day. No symptoms of hypotension, she has lost 55 pounds, she gets agitated. Discussed risks especially sedation, falls, addiction. Discussed Nuplazid, in the future can continue. Hallucinations improved after stopping steroids. Sister here for the first time, reviewed history. Her sister [name] is from [place] and they see eachother every month.    Interval history 01/06/2017: Hallucinations worsened with prednisone and improved since stopping. Doing well on the Risperidone. She has lost weight. No wandering, no calls to police. Will try Exelon again twice a day, stop for any side effects. She is restless, may be a side effect of the Risperidal. Since adjusting medications due to low  blood pressure she has not fallen, her blood pressure was very low and she was dizzy and doing better, no difficulty swallowing, some increased movements and balling up of her clothes with her hands, improved agitation. They have a caretaker that stays with her during the day. She walks around the house a lot, no wandering or calls to 911. She has lost about 40 pounds. No sleeping changes, they also have a sleeping aid to help at night.   Interval history 05/25/2016: Ms. Duty is a 72 year old female with Lewy body dementia. She returns today for follow-up. The patient has tried Aricept, Namenda and Exelon but has not been able to tolerate the medication. She is on Risperdal 2mg  twice daily for hallucinations and agitation. She lives with her husband. She is crying today because she could not remember the date on the MMSE. She notices her memory loss and it is frustrating for her. Husband is using 1/2 a pill twice daily unless needed for episodes of hallucinations and agitation. Husband is home during the day and someone sits with patient when he is at work. Her hallucinations have been under good control. He has noticed prednisone can trigger hallucinations in her. She goes to bed at 7pm, she takes Trazodone at night and she sleeps all night. No falls. No dysphagia. She takes vitamin D and a daily multivitamin. She does not cook, husband sets out her clothes but she can dress herself, she helps fold clothes, showers and toilets herself. Some confusion in the late afternoon when she says she wants to go home but  is actually already at home. She plays the piano at home with assistance from husband, he is exercising her brain.  Interval update 11/27/2015: She is doing very well. She is on Risperdal 2mg  twice daily and we discussed the risks associated with these medications. He has decreased to 1 pill in the morning 2mg  and discussed trying to limit as much as possible. He may give her 1/2 a pill in the afternoon  if needed. She is not having any side effects to the Rispersdal. It has helped with the delusions and hallucinations. She has not left the house recently. No falls. She has some back pain and she is on a muscle relaxer. Sleeping well, no issues at night. She takes 1/2 a trazodone at night. Husband and wife is happy. There is a book, 36-hour day. She feels like she is doing very well. Mother and grandmother had dementia. Discussed Lewy body dementia. She was a Theme park manager, [employer].   HISTORY OF PRESENT ILLNESS Ms. Bently is a 72 year old female with Lewy body dementia. She returns today for follow-up. The patient has tried Aricept, Namenda and Exelon but has not been able to tolerate the medication. Her husband reports that she is having hallucinations again. Her hallucinations consist of seeing people in her home. The hallucinations are not violent or fearful. She will occasionally has some delusional thinking such as feeling as if her jewelry has been stolen. She also states to me when her husband is out of the room-she feels that he is trying to divorce her. The patient and her husband feel that her memory has remained stable. She is able to complete all ADLs independently. She is currently not operating a motor vehicle primarily due to her vision. The patient is having trouble sleeping at night and her primary care started her on trazodone 50 mg at bedtime. She is been taking this for 1 week but has not seen any benefit yet. She returns today for an evaluation.  interval update 06/12/2015: She had side effects to donepezil. She became more confused and talking in sleep. Stopping it helped. Could not tolerate the namzeric either, more confusion. Will start Rivastigmine which is better studied in lewy body dementia. No more problems with leaving the house. She is doing well, patient and husband deny hallucinations since last being see, no swallowing difficulties, no falls, no accidents. I recommend  patient not drive anymore. Husband is with patient all the time except when at work and patient is at home and doing well. She does go out a lot but she has friends come pick her up and take her out. Her appetite has been good, eatong a good diet, getting out and walking, mood is good. Everything has been going well. No falls. No issues since last being seen, husband and patient endorse all is well. She is comfortably staying at her home alone as well. No delusions, no recent hallucinations.   Interval update 03/12/2015: Wife says things are fine. No incidents since the Silver Alert in September. She left in the car and was stopped by a state trooper. She is still having visual hallucinations. She also has illusions and delusions. But not like she was having before. B12 wnl. TSH wnl.   HPI: JAMERICA SNAVELY is a 72 y.o. female here as a referral from Dr. Kenton Kingfisher for forgetfullness. PMHx HTN. She says she drives, she never gets lost. She was hallucinating. She has memory loss. She thought someone was in the home. Multiple times.  She see people in church. She saw cowgirls one day. Daughter and husband are here. Daughter brought her to church and her mom hugged her and didn't remember her. Aroiund the age of 40 she started repeating things. Noticed it 8 years ago. Getting worse. Kids had a meeting with the husband and wanted a workup completed. More recent memory than remote. She is more combative. She is changing a little bit. She has glaucoma. Vision is worsening. She has a cataract developing on one eye. She was in bed one night, she was awakened . She saw a lady on the wall. She thought the headrest in the car were people. Last Thursday she thought she saw a white female in the bathroom. She is forgetting streets she has been on in the past frequently. She calls multiple people telling them she is seeing things, says people are waiting in the car. She has called multiple people to check the house. She called  someone from church 25 times. No shuffling gait or tremors. Patient's mother and grandmother had dementia, started around 6. She has lost her house and her car due to mismanagement of finances. They had to stay in a hotel when they lost everything. She is not bathing. She used to be very sharp, now she is forgetting she has rental property. Slowly progressive. She doesn't remember a lot of the incidents. She is getting lost. She should not be driving anywhere.    Reviewed notes, labs and imaging from outside physicians, which showed:  CT of the head 03/2012:personally reviewed and agree with findings below  Comparison: None.  Findings: The ventricles are normal in size, for this patient's age, and normal in configuration.  here are no parenchymal masses or mass effect. There are no areas of abnormal parenchymal attenuation. There is no evidence of a recent infarct.  There are no extra-axial masses or abnormal fluid collections.  No intracranial hemorrhage.  Right maxillary sinus mucosal thickening. The remaining visualized sinuses and mastoid air cells are clear. No skull lesion/fracture.  IMPRESSION: No intracranial abnormality. Right maxillary sinus mucosal thickening.  TSH wnl, ACE <14,   REVIEW OF SYSTEMS:Out of a complete 14 system review of symptoms, the patient complains only of the following symptoms, and all other reviewed systems are negative.  See HPI  ALLERGIES: No Known Allergies   Social History   Socioeconomic History  . Marital status: Married    Spouse name: [name]  . Number of children: 3  . Years of education: 78  . Highest education level: Not on file  Occupational History  . Not on file  Social Needs  . Financial resource strain: Not on file  . Food insecurity:    Worry: Not on file    Inability: Not on file  . Transportation needs:    Medical: Not on file    Non-medical: Not on file  Tobacco Use  . Smoking status: Never  Smoker  . Smokeless tobacco: Never Used  Substance and Sexual Activity  . Alcohol use: No  . Drug use: No  . Sexual activity: Not on file  Lifestyle  . Physical activity:    Days per week: Not on file    Minutes per session: Not on file  . Stress: Not on file  Relationships  . Social connections:    Talks on phone: Not on file    Gets together: Not on file    Attends religious service: Not on file    Active member of club  or organization: Not on file    Attends meetings of clubs or organizations: Not on file    Relationship status: Not on file  . Intimate partner violence:    Fear of current or ex partner: Not on file    Emotionally abused: Not on file    Physically abused: Not on file    Forced sexual activity: Not on file  Other Topics Concern  . Not on file  Social History Narrative   Lives at home with husband, [name].   Right handed   Drinks green tea    Family History  Problem Relation Age of Onset  . Dementia Mother     Past Medical History:  Diagnosis Date  . Dementia ([place])   . Hypertension   . UTI (urinary tract infection) 03/03/2018    Past Surgical History:  Procedure Laterality Date  . APPENDECTOMY    . TONSILLECTOMY    . TUBAL LIGATION      Current Outpatient Medications  Medication Sig Dispense Refill  . atropine 1 % ophthalmic solution Place 1 drop into both eyes daily.     . bimatoprost (LUMIGAN) 0.03 % ophthalmic solution Place 1 drop into both eyes at bedtime.    . brimonidine (ALPHAGAN) 0.2 % ophthalmic solution Place 1 drop into both eyes 2 (two) times daily.  12  . Bromfenac Sodium (PROLENSA) 0.07 % SOLN Apply 1 drop to eye 2 (two) times daily.     . cloNIDine (CATAPRES) 0.1 MG tablet Take 0.05 mg by mouth 2 (two) times daily.     . dorzolamide-timolol (COSOPT) 22.3-6.8 MG/ML ophthalmic solution Place 1 drop into both eyes 2 (two) times daily.    . fluorometholone (FML) 0.1 % ophthalmic suspension Place 1 drop into both eyes 2 (two) times  daily.   5  . lisinopril (PRINIVIL,ZESTRIL) 20 MG tablet Take 20 mg by mouth daily.    Marland Kitchen OVER THE COUNTER MEDICATION Take 1 tablet by mouth 2 (two) times daily. [facility]*    . OVER THE COUNTER MEDICATION Take 1 tablet by mouth daily. *haritaki*    . OVER THE COUNTER MEDICATION Take 1 tablet by mouth daily. *Memory Supplement*    . traZODone (DESYREL) 150 MG tablet Take 150 mg by mouth at bedtime.    Marland Kitchen LORazepam (ATIVAN) 0.5 MG tablet Take 1/2 pill to a whole pill every 8 hours for agitation. (Patient not taking: Reported on 03/04/2018) 30 tablet 4   No current facility-administered medications for this visit.     Allergies as of 04/07/2018  . (No Known Allergies)    Vitals: BP 90/62 (BP Location: Right Arm, Patient Position: Sitting)   Pulse 68   Ht 5\' 5"  (1.651 m)   Wt 140 lb (63.5 kg)   BMI 23.30 kg/m  Last Weight:  Wt Readings from Last 1 Encounters:  04/07/18 140 lb (63.5 kg)   Last Height:   Ht Readings from Last 1 Encounters:  04/07/18 5\' 5"  (1.651 m)    MMSE - Mini Mental State Exam 10/06/2017 05/25/2016 07/24/2015  Orientation to time 0 1 0  Orientation to Place 2 4 5   Registration 3 3 3   Attention/ Calculation 0 1 5  Recall 0 1 0  Language- name 2 objects 1 2 2   Language- repeat 0 1 1  Language- follow 3 step command 2 3 3   Language- read & follow direction 0 1 1  Write a sentence 0 0 1  Copy design 0 0 0  Total score 8 17 21      Cranial Nerves:  The pupils are equal, round, and reactive to light. Visual fields are full to finger confrontation. Extraocular movements are intact. Trigeminal sensation is intact and the muscles of mastication are normal. The face is symmetric. The palate elevates in the midline. Hearing intact. Voice is normal. Shoulder shrug is normal. The tongue has normal motion without fasciculations.    Motor Observation:  No asymmetry, no atrophy, and no involuntary movements noted. Tone:  Normal muscle tone.   Posture:   Posture is normal. normal erect   Strength:  Strength is V/V in the upper and lower limbs.      ASSESSMENT AND PLAN This is a 71 year old female who is here for at least 10 years of progressive memory loss. Her MMSE has declined from 21 to 8 and  last [place] cognitive assessment was 12 out of 30 . There are behavioral symptoms as well as hallucinations and delusions improved. Likely Lewy-body dementia.    1. Dementia- Lewy body dementia  The patient has not been able to tolerate Aricept, Namenda or Exelon. She was having hallucinations and some delusional thoughts however they are not violent or fearful. Improved, discontinued Risperdal at this time. Also improved since stopping prednisone.  Stopped Risperdal: There are risks associated with antipsychotics when taken with a diagnosis of lewy body dementia. Antipsychotics is not recommended unless psychosis is disabling. These medications are not FDA approved in dementia and carry a black nbox warning or significant morbidity and mortality. Patient's husband understands and acknowledges, patient acknowledges as well but given dementia her understanding may be limited.   Stop Risperdal due to abnormal mouth movements. Discussed may be Tardive Dyskinesias.  Discussed if there is an acute change, UTIs or other metabolic anf infectious causes should be considered, adequate hydration, urine should be a light clear yellow.  Will check labs today such as B12, homocysteine.  2. Weight loss: will order labwork today. Advised them to see pcp for evaluation of any other etiology such as malignancies.    Orders Placed This Encounter  Procedures  . Thyroid Panel With TSH  . B12 and Folate Panel  . Methylmalonic acid, serum  . RPR  . Homocysteine  . Thyroglobulin antibody  . Thyroid peroxidase antibody  . CBC  . Comprehensive metabolic panel    Recommend no driving and 24 hour monitoring of patient Discussed in depth Lewy  body dementia with patient and her husband. We'll follow clinically. Appears prednisone was worsening hallucinations, since stopped she is improved.  Stopped the Risperdal  Continue the Trazodone at night (prescribed by pcp)  Very low dose ativan for anxiety, discussed risks, sedation, falls nut she gets agitated so give only when necesary  Weight loss: she looks generally healthy but discuss with pcp to see if pan-scannng for other causes like malignancies may be indicated.   Discussed family issues, kids having difficulty and there are family separations, discussed this is very difficult and therapy may help.  Hypotension: she is asymptomatic. She has lost a lot of weight, she is on 3 BP meds, if she is symptomatic needs to go to ED otherwise call pcp today to adjust meds do not stop clonidine abruptly.  Today's history and physical demonstrated very substantial and measurable cognitive losses consistent with dementia. Based on the  substantial degree of impairment it is clear that she does not have the capacity to make an informed and appropriate decisions on her healthcare and finances.  I do recommend that she lives in a structured setting or with 24-hour care which her husband is providing for her. It is also clear that patient does not comprehend the degree of cognitive losses but she does express frustration at her memory changes. Patient suffering from substantial cognitive impairment due to dementia. She can perform ADLs with assistance but needs help with all IADLs.  Sarina Ill, MD  [facility] [address] [place], [place] [address]  Phone [phone] Fax [phone]  A total of 25 minutes was spent face-to-face with this patient. Over half this time was spent on counseling patient on the  1. Dementia with behavioral disturbance, unspecified dementia type ([place])   2. Weight loss   3. B12 deficiency   4. Increased homocysteine ([facility])      diagnosis and different diagnostic and therapeutic options available.

## 2018-04-14 ENCOUNTER — Telehealth: Payer: Self-pay | Admitting: Neurology

## 2018-04-14 DIAGNOSIS — H401111 Primary open-angle glaucoma, right eye, mild stage: Secondary | ICD-10-CM | POA: Diagnosis not present

## 2018-04-14 DIAGNOSIS — H2511 Age-related nuclear cataract, right eye: Secondary | ICD-10-CM | POA: Diagnosis not present

## 2018-04-14 DIAGNOSIS — Z961 Presence of intraocular lens: Secondary | ICD-10-CM | POA: Diagnosis not present

## 2018-04-14 DIAGNOSIS — H401123 Primary open-angle glaucoma, left eye, severe stage: Secondary | ICD-10-CM | POA: Diagnosis not present

## 2018-04-14 NOTE — Telephone Encounter (Signed)
Spoke with pt's husband and advised that Dr. Jaynee Eagles did place new lab orders on Monday. Discussed office hours. Pt's husband verbalized appreciation and stated the he would try to bring the patient either tomorrow or next Monday.

## 2018-04-14 NOTE — Telephone Encounter (Signed)
Pt husband(on DPR from 04-30-2017 Garis,James T ) has called asking if pt needs to come in for additional bloodwork, husband states when here last computer systems were down and no records could be confirmed of pt's last blood draw.  Please call pt husband

## 2018-04-20 ENCOUNTER — Other Ambulatory Visit (INDEPENDENT_AMBULATORY_CARE_PROVIDER_SITE_OTHER): Payer: Self-pay

## 2018-04-20 DIAGNOSIS — F0391 Unspecified dementia with behavioral disturbance: Secondary | ICD-10-CM

## 2018-04-20 DIAGNOSIS — E7211 Homocystinuria: Secondary | ICD-10-CM

## 2018-04-20 DIAGNOSIS — R7989 Other specified abnormal findings of blood chemistry: Secondary | ICD-10-CM

## 2018-04-20 DIAGNOSIS — E538 Deficiency of other specified B group vitamins: Secondary | ICD-10-CM

## 2018-04-20 DIAGNOSIS — R634 Abnormal weight loss: Secondary | ICD-10-CM

## 2018-04-20 DIAGNOSIS — Z0289 Encounter for other administrative examinations: Secondary | ICD-10-CM

## 2018-05-03 DIAGNOSIS — I10 Essential (primary) hypertension: Secondary | ICD-10-CM | POA: Diagnosis not present

## 2018-05-03 DIAGNOSIS — Z1211 Encounter for screening for malignant neoplasm of colon: Secondary | ICD-10-CM | POA: Diagnosis not present

## 2018-05-03 DIAGNOSIS — G3183 Dementia with Lewy bodies: Secondary | ICD-10-CM | POA: Diagnosis not present

## 2018-05-03 DIAGNOSIS — K5904 Chronic idiopathic constipation: Secondary | ICD-10-CM | POA: Diagnosis not present

## 2018-05-03 DIAGNOSIS — Z0000 Encounter for general adult medical examination without abnormal findings: Secondary | ICD-10-CM | POA: Diagnosis not present

## 2018-05-03 DIAGNOSIS — D869 Sarcoidosis, unspecified: Secondary | ICD-10-CM | POA: Diagnosis not present

## 2018-05-03 DIAGNOSIS — F5101 Primary insomnia: Secondary | ICD-10-CM | POA: Diagnosis not present

## 2018-05-03 DIAGNOSIS — Z23 Encounter for immunization: Secondary | ICD-10-CM | POA: Diagnosis not present

## 2018-08-22 DIAGNOSIS — R600 Localized edema: Secondary | ICD-10-CM | POA: Diagnosis not present

## 2018-08-28 ENCOUNTER — Emergency Department (HOSPITAL_COMMUNITY): Payer: Medicare Other

## 2018-08-28 ENCOUNTER — Encounter (HOSPITAL_COMMUNITY): Payer: Self-pay | Admitting: Emergency Medicine

## 2018-08-28 ENCOUNTER — Inpatient Hospital Stay (HOSPITAL_COMMUNITY)
Admission: EM | Admit: 2018-08-28 | Discharge: 2018-09-02 | DRG: 640 | Disposition: A | Discharge status: Home Health | Payer: Medicare Other | Attending: Internal Medicine | Admitting: Internal Medicine

## 2018-08-28 DIAGNOSIS — Z515 Encounter for palliative care: Secondary | ICD-10-CM

## 2018-08-28 DIAGNOSIS — K629 Disease of anus and rectum, unspecified: Secondary | ICD-10-CM | POA: Diagnosis not present

## 2018-08-28 DIAGNOSIS — R627 Adult failure to thrive: Secondary | ICD-10-CM

## 2018-08-28 DIAGNOSIS — I10 Essential (primary) hypertension: Secondary | ICD-10-CM | POA: Diagnosis not present

## 2018-08-28 DIAGNOSIS — E860 Dehydration: Principal | ICD-10-CM | POA: Diagnosis present

## 2018-08-28 DIAGNOSIS — N179 Acute kidney failure, unspecified: Secondary | ICD-10-CM | POA: Diagnosis present

## 2018-08-28 DIAGNOSIS — R6510 Systemic inflammatory response syndrome (SIRS) of non-infectious origin without acute organ dysfunction: Secondary | ICD-10-CM | POA: Diagnosis present

## 2018-08-28 DIAGNOSIS — M25561 Pain in right knee: Secondary | ICD-10-CM | POA: Diagnosis not present

## 2018-08-28 DIAGNOSIS — E870 Hyperosmolality and hypernatremia: Secondary | ICD-10-CM | POA: Diagnosis not present

## 2018-08-28 DIAGNOSIS — Z87440 Personal history of urinary (tract) infections: Secondary | ICD-10-CM

## 2018-08-28 DIAGNOSIS — K5900 Constipation, unspecified: Secondary | ICD-10-CM | POA: Diagnosis present

## 2018-08-28 DIAGNOSIS — Z66 Do not resuscitate: Secondary | ICD-10-CM | POA: Diagnosis present

## 2018-08-28 DIAGNOSIS — G3183 Dementia with Lewy bodies: Secondary | ICD-10-CM | POA: Diagnosis present

## 2018-08-28 DIAGNOSIS — I959 Hypotension, unspecified: Secondary | ICD-10-CM | POA: Diagnosis present

## 2018-08-28 DIAGNOSIS — G309 Alzheimer's disease, unspecified: Secondary | ICD-10-CM | POA: Diagnosis present

## 2018-08-28 DIAGNOSIS — R0902 Hypoxemia: Secondary | ICD-10-CM

## 2018-08-28 DIAGNOSIS — R109 Unspecified abdominal pain: Secondary | ICD-10-CM | POA: Diagnosis not present

## 2018-08-28 DIAGNOSIS — D696 Thrombocytopenia, unspecified: Secondary | ICD-10-CM | POA: Diagnosis present

## 2018-08-28 DIAGNOSIS — F0391 Unspecified dementia with behavioral disturbance: Secondary | ICD-10-CM | POA: Diagnosis not present

## 2018-08-28 DIAGNOSIS — F02818 Dementia in other diseases classified elsewhere, unspecified severity, with other behavioral disturbance: Secondary | ICD-10-CM | POA: Diagnosis present

## 2018-08-28 DIAGNOSIS — J9601 Acute respiratory failure with hypoxia: Secondary | ICD-10-CM | POA: Diagnosis present

## 2018-08-28 DIAGNOSIS — Z6823 Body mass index (BMI) 23.0-23.9, adult: Secondary | ICD-10-CM

## 2018-08-28 DIAGNOSIS — F03918 Unspecified dementia, unspecified severity, with other behavioral disturbance: Secondary | ICD-10-CM | POA: Diagnosis present

## 2018-08-28 DIAGNOSIS — R64 Cachexia: Secondary | ICD-10-CM | POA: Diagnosis present

## 2018-08-28 DIAGNOSIS — G9341 Metabolic encephalopathy: Secondary | ICD-10-CM | POA: Diagnosis present

## 2018-08-28 DIAGNOSIS — K5904 Chronic idiopathic constipation: Secondary | ICD-10-CM | POA: Diagnosis not present

## 2018-08-28 DIAGNOSIS — E878 Other disorders of electrolyte and fluid balance, not elsewhere classified: Secondary | ICD-10-CM | POA: Diagnosis present

## 2018-08-28 DIAGNOSIS — R410 Disorientation, unspecified: Secondary | ICD-10-CM | POA: Diagnosis not present

## 2018-08-28 DIAGNOSIS — H4010X3 Unspecified open-angle glaucoma, severe stage: Secondary | ICD-10-CM | POA: Diagnosis present

## 2018-08-28 DIAGNOSIS — F0281 Dementia in other diseases classified elsewhere with behavioral disturbance: Secondary | ICD-10-CM | POA: Diagnosis not present

## 2018-08-28 DIAGNOSIS — R531 Weakness: Secondary | ICD-10-CM | POA: Diagnosis not present

## 2018-08-28 LAB — COMPREHENSIVE METABOLIC PANEL
ALT: 74 U/L — ABNORMAL HIGH (ref 0–44)
AST: 84 U/L — ABNORMAL HIGH (ref 15–41)
Albumin: 3.3 g/dL — ABNORMAL LOW (ref 3.5–5.0)
Alkaline Phosphatase: 79 U/L (ref 38–126)
BUN: 151 mg/dL — ABNORMAL HIGH (ref 8–23)
CO2: 16 mmol/L — ABNORMAL LOW (ref 22–32)
Calcium: 9.9 mg/dL (ref 8.9–10.3)
Chloride: >130 mmol/L (ref 98–111)
Creatinine, Ser: 7.99 mg/dL — ABNORMAL HIGH (ref 0.44–1.00)
GFR calc Af Amer: 5 mL/min — ABNORMAL LOW (ref >60)
GFR calc non Af Amer: 5 mL/min — ABNORMAL LOW (ref >60)
Glucose, Bld: 126 mg/dL — ABNORMAL HIGH (ref 70–99)
Potassium: 5.1 mmol/L (ref 3.5–5.1)
Sodium: 173 mmol/L (ref 135–145)
Total Bilirubin: 0.7 mg/dL (ref 0.3–1.2)
Total Protein: 7.3 g/dL (ref 6.5–8.1)

## 2018-08-28 LAB — BASIC METABOLIC PANEL
BUN: 140 mg/dL — ABNORMAL HIGH (ref 8–23)
CO2: 15 mmol/L — ABNORMAL LOW (ref 22–32)
Calcium: 9 mg/dL (ref 8.9–10.3)
Chloride: >130 mmol/L (ref 98–111)
Creatinine, Ser: 6.82 mg/dL — ABNORMAL HIGH (ref 0.44–1.00)
GFR calc Af Amer: 6 mL/min — ABNORMAL LOW (ref >60)
GFR calc non Af Amer: 6 mL/min — ABNORMAL LOW (ref >60)
Glucose, Bld: 115 mg/dL — ABNORMAL HIGH (ref 70–99)
Potassium: 4.8 mmol/L (ref 3.5–5.1)
Sodium: 172 mmol/L (ref 135–145)

## 2018-08-28 LAB — CBC
HCT: 40 % (ref 36.0–46.0)
Hemoglobin: 12.5 g/dL (ref 12.0–15.0)
MCH: 31.8 pg (ref 26.0–34.0)
MCHC: 31.3 g/dL (ref 30.0–36.0)
MCV: 101.8 fL — ABNORMAL HIGH (ref 80.0–100.0)
Platelets: 103 10*3/uL — ABNORMAL LOW (ref 150–400)
RBC: 3.93 MIL/uL (ref 3.87–5.11)
RDW: 15.9 % — ABNORMAL HIGH (ref 11.5–15.5)
WBC: 11.6 10*3/uL — ABNORMAL HIGH (ref 4.0–10.5)
nRBC: 0.3 % — ABNORMAL HIGH (ref 0.0–0.2)

## 2018-08-28 LAB — TROPONIN I: Troponin I: 0.06 ng/mL (ref <0.03)

## 2018-08-28 LAB — OSMOLALITY: Osmolality: 407 mOsm/kg (ref 275–295)

## 2018-08-28 LAB — LACTIC ACID, PLASMA: Lactic Acid, Venous: 2.4 mmol/L (ref 0.5–1.9)

## 2018-08-28 LAB — POC OCCULT BLOOD, ED: Fecal Occult Bld: NEGATIVE

## 2018-08-28 MED ORDER — DORZOLAMIDE HCL-TIMOLOL MAL 2-0.5 % OP SOLN
1.0000 [drp] | Freq: Two times a day (BID) | OPHTHALMIC | Status: DC
  Administered 2018-08-28 – 2018-09-01 (×9): 1 [drp] via OPHTHALMIC
  Filled 2018-08-28: qty 10

## 2018-08-28 MED ORDER — ENOXAPARIN SODIUM 30 MG/0.3ML ~~LOC~~ SOLN
30.0000 mg | Freq: Every day | SUBCUTANEOUS | Status: DC
  Administered 2018-08-28: 30 mg via SUBCUTANEOUS
  Filled 2018-08-28: qty 0.3

## 2018-08-28 MED ORDER — FENTANYL CITRATE (PF) 100 MCG/2ML IJ SOLN
25.0000 ug | Freq: Once | INTRAMUSCULAR | Status: AC
  Administered 2018-08-28: 25 ug via INTRAVENOUS
  Filled 2018-08-28: qty 2

## 2018-08-28 MED ORDER — ATROPINE SULFATE 1 % OP SOLN
1.0000 [drp] | Freq: Every day | OPHTHALMIC | Status: DC
  Administered 2018-08-29 – 2018-09-01 (×4): 1 [drp] via OPHTHALMIC
  Filled 2018-08-28: qty 2

## 2018-08-28 MED ORDER — POLYETHYLENE GLYCOL 3350 17 G PO PACK: 17.0000 g | PACK | Freq: Every day | ORAL | Status: DC | PRN

## 2018-08-28 MED ORDER — LATANOPROST 0.005 % OP SOLN
1.0000 [drp] | Freq: Every day | OPHTHALMIC | Status: DC
  Administered 2018-08-28 – 2018-09-01 (×5): 1 [drp] via OPHTHALMIC
  Filled 2018-08-28: qty 2.5

## 2018-08-28 MED ORDER — ONDANSETRON HCL 4 MG PO TABS: 4.0000 mg | ORAL_TABLET | Freq: Four times a day (QID) | ORAL | Status: DC | PRN

## 2018-08-28 MED ORDER — ACETAMINOPHEN 325 MG PO TABS: 650.0000 mg | ORAL_TABLET | Freq: Four times a day (QID) | ORAL | Status: DC | PRN

## 2018-08-28 MED ORDER — ACETAMINOPHEN 650 MG RE SUPP: 650.0000 mg | Freq: Four times a day (QID) | RECTAL | Status: DC | PRN

## 2018-08-28 MED ORDER — DEXTROSE 5 % IV SOLN
INTRAVENOUS | Status: DC
  Administered 2018-08-28 – 2018-08-29 (×2): via INTRAVENOUS

## 2018-08-28 MED ORDER — SODIUM CHLORIDE 0.9% FLUSH
3.0000 mL | Freq: Two times a day (BID) | INTRAVENOUS | Status: DC
  Administered 2018-08-31 – 2018-09-01 (×3): 3 mL via INTRAVENOUS

## 2018-08-28 MED ORDER — FLUOROMETHOLONE 0.1 % OP SUSP
1.0000 [drp] | Freq: Two times a day (BID) | OPHTHALMIC | Status: DC
  Administered 2018-08-28 – 2018-09-01 (×9): 1 [drp] via OPHTHALMIC
  Filled 2018-08-28: qty 5

## 2018-08-28 MED ORDER — SODIUM CHLORIDE 0.9 % IV BOLUS
1000.0000 mL | Freq: Once | INTRAVENOUS | Status: AC
  Administered 2018-08-28: 14:00:00 1000 mL via INTRAVENOUS

## 2018-08-28 MED ORDER — DOCUSATE SODIUM 100 MG PO CAPS
100.0000 mg | ORAL_CAPSULE | Freq: Two times a day (BID) | ORAL | Status: DC
  Administered 2018-08-29 – 2018-09-01 (×3): 100 mg via ORAL
  Filled 2018-08-28 (×5): qty 1

## 2018-08-28 MED ORDER — KETOROLAC TROMETHAMINE 0.5 % OP SOLN
1.0000 [drp] | Freq: Two times a day (BID) | OPHTHALMIC | Status: DC
  Administered 2018-08-28 – 2018-09-01 (×9): 1 [drp] via OPHTHALMIC
  Filled 2018-08-28: qty 5

## 2018-08-28 MED ORDER — BISACODYL 10 MG RE SUPP
10.0000 mg | Freq: Every day | RECTAL | Status: DC | PRN
  Filled 2018-08-28: qty 1

## 2018-08-28 MED ORDER — FLEET ENEMA 7-19 GM/118ML RE ENEM: 1.0000 | ENEMA | Freq: Once | RECTAL | Status: DC | PRN

## 2018-08-28 MED ORDER — ONDANSETRON HCL 4 MG/2ML IJ SOLN: 4.0000 mg | Freq: Four times a day (QID) | INTRAMUSCULAR | Status: DC | PRN

## 2018-08-28 MED ORDER — SODIUM CHLORIDE 0.9 % IV BOLUS
1000.0000 mL | Freq: Once | INTRAVENOUS | Status: AC
  Administered 2018-08-28: 1000 mL via INTRAVENOUS

## 2018-08-28 MED ORDER — TRAZODONE HCL 50 MG PO TABS
50.0000 mg | ORAL_TABLET | Freq: Once | ORAL | Status: AC
  Administered 2018-08-28: 21:00:00 50 mg via ORAL
  Filled 2018-08-28: qty 1

## 2018-08-28 NOTE — H&P (Signed)
History and Physical    JONIYAH Jensen ZMO:[id number] DOB: 02-03-1946 DOA: 08/28/2018  PCP: Shirline Frees, MD Consultants:  Jaynee Eagles - neurology; March Rummage - podiatry Patient coming from:  Home - lives with husband and step-daughter; NOK: Husband, [phone]  Chief Complaint: abdominal pain  HPI: JAIDEE Jensen is a 73 y.o. female with medical history significant of dementia and HTN presenting with abdominal pain. Her husband reports that at baseline she can walk without assistance; talk sometimes incoherently but generally remembers him; and can feed herself.  Over the last 2-3 weeks, she has not been eating or drinking well.  He thought maybe she was constipated and so called 911 on Friday; EMS recommended that they try laxatives.  She had a BM and appeared to feel a little better.  She ate Saturday morning, but was sleeping more and fidgeting more throughout the weekend.  Today, he noticed that her fingers were blue on the tips.  She has had weight loss; she was as high as 325 pounds in the last but was 143 and then 137 last September.   ED Course:  Not eating, drinking for a week.  ?constipation, increased confusion.  VSS.  Husband noticed that fingertips were blue, but normal sats.  Needs SDU given severe dehydration.  Review of Systems: Unable to perform  Ambulatory Status:  Ambulates without assistance  Past Medical History:  Diagnosis Date  . Dementia ([place])   . Hypertension   . UTI (urinary tract infection) 03/03/2018    Past Surgical History:  Procedure Laterality Date  . APPENDECTOMY    . TONSILLECTOMY    . TUBAL LIGATION      Social History   Socioeconomic History  . Marital status: Married    Spouse name: [name]  . Number of children: 3  . Years of education: 1  . Highest education level: Not on file  Occupational History  . Not on file  Social Needs  . Financial resource strain: Not on file  . Food insecurity:    Worry: Not on file    Inability: Not on file   . Transportation needs:    Medical: Not on file    Non-medical: Not on file  Tobacco Use  . Smoking status: Never Smoker  . Smokeless tobacco: Never Used  Substance and Sexual Activity  . Alcohol use: No  . Drug use: No  . Sexual activity: Not on file  Lifestyle  . Physical activity:    Days per week: Not on file    Minutes per session: Not on file  . Stress: Not on file  Relationships  . Social connections:    Talks on phone: Not on file    Gets together: Not on file    Attends religious service: Not on file    Active member of club or organization: Not on file    Attends meetings of clubs or organizations: Not on file    Relationship status: Not on file  . Intimate partner violence:    Fear of current or ex partner: Not on file    Emotionally abused: Not on file    Physically abused: Not on file    Forced sexual activity: Not on file  Other Topics Concern  . Not on file  Social History Narrative   Lives at home with husband, [name].   Right handed   Drinks green tea    No Known Allergies  Family History  Problem Relation Age of Onset  . Dementia Mother  Prior to Admission medications   Medication Sig Start Date End Date Taking? Authorizing Provider  atropine 1 % ophthalmic solution Place 1 drop into both eyes daily.    Yes [provider]  bimatoprost (LUMIGAN) 0.03 % ophthalmic solution Place 1 drop into both eyes at bedtime.   Yes [provider]  Bromfenac Sodium (PROLENSA) 0.07 % SOLN Apply 1 drop to eye 2 (two) times daily.    Yes [provider]  dorzolamide-timolol (COSOPT) 22.3-6.8 MG/ML ophthalmic solution Place 1 drop into both eyes 2 (two) times daily.   Yes [provider]  fluorometholone (FML) 0.1 % ophthalmic suspension Place 1 drop into both eyes 2 (two) times daily.  04/18/17  Yes [provider]  lisinopril (PRINIVIL,ZESTRIL) 20 MG tablet Take 20 mg by mouth daily.   Yes [provider]   OVER THE COUNTER MEDICATION Take 1 tablet by mouth 2 (two) times daily. [facility]*   Yes [provider]  traZODone (DESYREL) 150 MG tablet Take 150 mg by mouth at bedtime.   Yes [provider]    Physical Exam: Vitals:   08/28/18 1330 08/28/18 1339 08/28/18 1345 08/28/18 1405  BP: 118/89 118/89    Pulse: (!) 54 90 85 94  Resp:  17 (!) 24 17  Temp:  97.9 F (36.6 C)    TempSrc:  Rectal    SpO2: (!) 88% (!) 88%  95%     . General: She is frail, cachectic and ill-appearing; she will wake up with stimulation but is easily agitated with any disturbance . Eyes:  PERRL, EOMI, normal lids, iris . ENT:  grossly normal hearing, lips & tongue, dry mm . Neck:  no LAD, masses or thyromegaly . Cardiovascular:  RRR, no m/r/g. No LE edema.  Marland Kitchen Respiratory:   CTA bilaterally with no wheezes/rales/rhonchi.  Normal respiratory effort. . Abdomen:  soft, NT, ND, NABS - she was annoying by being disturbed but did not appear moreso when I palpated throughout her abdomen . Skin:  no rash or induration seen on limited exam . Musculoskeletal:  grossly normal tone BUE/BLE,  no bony abnormality . Psychiatric: non-conversant, easily agitated . Neurologic:  Unable to assess    Radiological Exams on Admission: Ct Abdomen Pelvis Wo Contrast  Result Date: 08/28/2018 CLINICAL DATA:  73 year old with dementia who has acute mental status changes and recent stools that have been black and tarry. Surgical history includes appendectomy and tubal ligation. EXAM: CT ABDOMEN AND PELVIS WITHOUT CONTRAST TECHNIQUE: Multidetector CT imaging of the abdomen and pelvis was performed following the standard protocol without IV contrast. COMPARISON:  None. FINDINGS: Beam hardening streak artifact is present on many of the images as the patient was unable to raise the arms. Lower chest: Heart size normal.  Visualized lung bases clear. Hepatobiliary: Allowing for the beam hardening streak artifact, no focal  abnormalities involving the liver, allowing for the unenhanced technique. Gallbladder normal in appearance without calcified gallstones. No biliary ductal dilation. Pancreas: Allowing for the beam hardening streak artifact, normal unenhanced appearance. Spleen: Allowing for the beam hardening streak artifact, normal unenhanced appearance. Adrenals/Urinary Tract: Normal appearing adrenal glands. Non-obstructing approximate 4 mm calculus in a mid calyx of the LEFT kidney. No urinary tract calculi elsewhere on either side. Allowing for the beam hardening streak artifact and the unenhanced technique, no focal parenchymal abnormality involving either kidney. Normal appearing urinary bladder (which is displaced anteriorly by a massively distended stool-filled rectum). Stomach/Bowel: Stomach normal in appearance for the  degree of distention. Normal-appearing small bowel. Several loops of jejunum in the LEFT UPPER QUADRANT are LATERAL to the descending colon. Massively distended stool-filled rectum with circumferential wall thickening of the distal rectum near the anal verge. Moderate colonic stool burden elsewhere. Sigmoid colon elongated and tortuous. No focal colonic abnormalities otherwise. Surgically absent appendix. Vascular/Lymphatic: Mild aortic atherosclerosis without evidence of aneurysm. No pathologic lymphadenopathy. Reproductive: Normal appearing uterus displaced anteriorly by the massively distended rectum. No adnexal masses. Other: Numerous pelvic phleboliths. Musculoskeletal: Facet degenerative changes involving the LOWER lumbar spine with degenerative grade 1-2 spondylolisthesis of L4 on L5 measuring approximately 11 mm. Irregularity involving the LOWER endplate of L5 is likely chronic and related to degenerative changes. IMPRESSION: 1. Massively distended rectum filled with stool with circumferential wall thickening involving the distal rectum near the anal verge, suspect fecal impaction and possible  proctitis. 2. No acute abnormalities otherwise involving the abdomen or pelvis. 3. Nonobstructing 4 mm calculus in a mid calyx of the left kidney. 4. Several loops of jejunum in the left upper quadrant are LATERAL to the descending colon raising the question of an internal hernia. No evidence of small bowel obstruction. Electronically Signed   By: Evangeline Dakin M.D.   On: 08/28/2018 16:41   Dg Chest Port 1 View  Result Date: 08/28/2018 CLINICAL DATA:  Current history of dementia, presenting with acute mental status changes and hypoxia. Current history of hypertension. EXAM: PORTABLE CHEST 1 VIEW COMPARISON:  11/28/2016, 03/11/2012. FINDINGS: Cardiac silhouette normal in size for AP portable technique, unchanged. Lungs clear. Bronchovascular markings normal. Pulmonary vascularity normal. No visible pleural effusions. No pneumothorax. IMPRESSION: No acute cardiopulmonary disease. Electronically Signed   By: Evangeline Dakin M.D.   On: 08/28/2018 15:03    EKG: Independently reviewed.  NSR with rate 88; nonspecific ST changes with no evidence of acute ischemia   Labs on Admission: I have personally reviewed the available labs and imaging studies at the time of the admission.  Pertinent labs:   Na++ 173 CO2 16 Glucose 126 BUN 151/Creatinine 7.99/GFRGFR 5; 26/1.14/55 on 10/31 Albumin 3.3 AST 84/ALT 74 Troponin 0.06 Lactate 2.4 WBC 11.6 Platelets 103 Heme negative   Assessment/Plan Principal Problem:   Dehydration with hypernatremia Active Problems:   Lewy body dementia with behavioral disturbance (HCC)   Essential hypertension   Open-angle glaucoma, severe stage   Acute renal failure (ARF) (HCC)     Hypernatremia, acute renal failure -Patient with h/o Alzheimer's and so likely decreased thirst but also decreased ability to request food/drink as well as progressive unwillingness to take PO presenting with profound dehydration leading to acute renal failure and lactic acidosis.  -She has been taking Lisinopril, which also likely contributed to the progressive renal failure -Patient received 2000 mL bolus while in the ER  -Today's weight is pending, but assuming her current weight is 60kg, her estimated water deficit is 5723 mL -Will treat with judicious initial hydration with D5W at 75 cc/hour; this would be expected to result in correction over approximately 70 hours  -Will monitor sodium q6h to attempt to avoid overcorrection (>42mEq/L/day)  -Hold nephrotoxic agents as much as possible -As she begins to improve, the fluid may be able to replaced more quickly, but she is currently profoundly hypernatremic -I have discussed the severity of her condition with her husband and she will be DNR; if she does not respond to current measures or has persistent inability to take PO, further goals of care discussion may be needed  Severe constipation -  CT indicates severe constipation with a possible stercoral ulcer -Will request manual disimpaction and order an aggressive stool regimen for the patient -This is likely the cause of her diminishing appetite and the start of the cycle that resulted in the aforementioned renal failure and hypernatremia  Lewy body dementia with behavioral disturbance -She does not appear to take medication for this, other than trazodone for sleep -It is not clear at this time whether this is related to terminal dementia or whether the patient will have meaningful recovery -Husband is aware and has opted to make her DNR  HTN -Hold Lisinopril due to renal failure -Currently normotensive, so will not order medication at this time  Glaucoma -Continue multiple home drops   DVT prophylaxis: Lovenox  Code Status:  DNR - confirmed with family Family Communication: Husband was present throughout evaluation Disposition Plan:  To be determined Consults called: None Admission status: Admit - It is my clinical opinion that admission to INPATIENT is  reasonable and necessary because of the expectation that this patient will require hospital care that crosses at least 2 midnights to treat this condition based on the medical complexity of the problems presented.  Given the aforementioned information, the predictability of an adverse outcome is felt to be significant.    Karmen Bongo MD [facility]   How to contact the [facility] Attending or Consulting provider [place] or covering provider during after hours [place], for this patient?  1. Check the care team in [facility] and look for a) attending/consulting [facility] provider listed and b) the [facility] team listed 2. Log into [email or web address] and use [place]'s universal password to access. If you do not have the password, please contact the hospital operator. 3. Locate the [facility] provider you are looking for under [facility] and page to a number that you can be directly reached. 4. If you still have difficulty reaching the provider, please page the [facility] (Director on Call) for the Hospitalists listed on amion for assistance.   08/28/2018, 5:25 PM

## 2018-08-28 NOTE — Significant Event (Signed)
Rapid Response Event Note  Overview: Time Called: 2226 Arrival Time: 2230 Event Type: Respiratory  Initial Focused Assessment: RN stating pt spO2 77%, placed on NRB and RT called. On arrival, pt minimally responsive and unable to pickup spO2 reading. After multiple sites used we were able to see a good waveform and sats of 99%. Pt fingertips cyanotic. Temp recorded at 98.2 but pts hand cool to touch. Spoke with Blount NP and will continue to warm pt and monitor O2 sats. If unable to pickup a good reading after keeping pt warm, may need to order abg and bipap.   Plan of Care (if not transferred): Continue to keep pt warm and monitor spO2. Instructed bedside RN and charge RN that if unable to read spO2 after pt kept warm to call provider back for possible abg. Bipap order placed PRN. RN and RT aware and agreeable to plan.  Event Summary: Name of Physician Notified: Blount NP at 2235    at    Outcome: Stayed in room and stabalized  Event End Time: 2250  Sherilyn Dacosta

## 2018-08-28 NOTE — ED Notes (Signed)
Attempted report x1. 

## 2018-08-28 NOTE — ED Provider Notes (Signed)
[facility] Emergency Department Provider Note MRN:  [id number]  Arrival date & time: 08/28/18     Chief Complaint   Abdominal pain History of Present Illness   Tanya Jensen is a 73 y.o. year-old female with a history of dementia, hypertension presenting to the ED with chief complaint of abdominal pain.  Patient has significant dementia and history was obtained from husband.  Patient has been eating poorly and not drinking well for the past week.  Husband feels that she has been having increased abdominal bloating and abdominal tenderness on the right side.  Suspecting possible constipation.  Denies fever.  Also endorsed blue discoloration to the fingers today.  Denies chest pain or shortness of breath, husband is not aware of black stools or blood in the stool.  I was unable to obtain an accurate HPI, PMH, or ROS due to the patient's dementia.  Review of Systems  Positive for abdominal pain, dementia  Patient's Health History    Past Medical History:  Diagnosis Date  . Dementia ([place])   . Hypertension   . UTI (urinary tract infection) 03/03/2018    Past Surgical History:  Procedure Laterality Date  . APPENDECTOMY    . TONSILLECTOMY    . TUBAL LIGATION      Family History  Problem Relation Age of Onset  . Dementia Mother     Social History   Socioeconomic History  . Marital status: Married    Spouse name: [name]  . Number of children: 3  . Years of education: 42  . Highest education level: Not on file  Occupational History  . Not on file  Social Needs  . Financial resource strain: Not on file  . Food insecurity:    Worry: Not on file    Inability: Not on file  . Transportation needs:    Medical: Not on file    Non-medical: Not on file  Tobacco Use  . Smoking status: Never Smoker  . Smokeless tobacco: Never Used  Substance and Sexual Activity  . Alcohol use: No  . Drug use: No  . Sexual activity: Not on file  Lifestyle  . Physical  activity:    Days per week: Not on file    Minutes per session: Not on file  . Stress: Not on file  Relationships  . Social connections:    Talks on phone: Not on file    Gets together: Not on file    Attends religious service: Not on file    Active member of club or organization: Not on file    Attends meetings of clubs or organizations: Not on file    Relationship status: Not on file  . Intimate partner violence:    Fear of current or ex partner: Not on file    Emotionally abused: Not on file    Physically abused: Not on file    Forced sexual activity: Not on file  Other Topics Concern  . Not on file  Social History Narrative   Lives at home with husband, [name].   Right handed   Drinks green tea     Physical Exam  Vital Signs and Nursing Notes reviewed Vitals:   08/28/18 1345 08/28/18 1405  BP:    Pulse: 85 94  Resp: (!) 24 17  Temp:    SpO2:  95%    CONSTITUTIONAL: Chronically ill-appearing, NAD NEURO: Alert, not oriented, moving all extremities EYES:  eyes equal and reactive ENT/NECK:  no LAD, no  JVD CARDIO: Regular rate, well-perfused, normal S1 and S2 PULM:  CTAB no wheezing or rhonchi GI/GU:  normal bowel sounds, non-distended, moderate right lower quadrant tenderness to palpation MSK/SPINE:  No gross deformities, no edema SKIN:  no rash, atraumatic PSYCH:  Appropriate speech and behavior  Diagnostic and Interventional Summary    EKG Interpretation  Date/Time:  Sunday August 28 2018 13:40:25 EDT Ventricular Rate:  88 PR Interval:    QRS Duration: 93 QT Interval:  364 QTC Calculation: 441 R Axis:   -32 Text Interpretation:  Sinus rhythm Borderline short PR interval Left axis deviation Borderline ST elevation, lateral leads Artifact in lead(s) I III aVR aVL aVF V1 V2 Confirmed by Gerlene Fee [id number]) on 08/28/2018 1:52:32 PM      Labs Reviewed  CBC - Abnormal; Notable for the following components:      Result Value   WBC 11.6 (*)    MCV 101.8 (*)     RDW 15.9 (*)    Platelets 103 (*)    nRBC 0.3 (*)    All other components within normal limits  COMPREHENSIVE METABOLIC PANEL - Abnormal; Notable for the following components:   Sodium 173 (*)    Chloride >130 (*)    CO2 16 (*)    Glucose, Bld 126 (*)    BUN 151 (*)    Creatinine, Ser 7.99 (*)    Albumin 3.3 (*)    AST 84 (*)    ALT 74 (*)    GFR calc non Af Amer 5 (*)    GFR calc Af Amer 5 (*)    All other components within normal limits  TROPONIN I - Abnormal; Notable for the following components:   Troponin I 0.06 (*)    All other components within normal limits  LACTIC ACID, PLASMA - Abnormal; Notable for the following components:   Lactic Acid, Venous 2.4 (*)    All other components within normal limits  URINALYSIS, ROUTINE W REFLEX MICROSCOPIC  POC OCCULT BLOOD, ED    CT ABDOMEN PELVIS WO CONTRAST  Final Result    DG Chest Port 1 View  Final Result      Medications  sodium chloride 0.9 % bolus 1,000 mL (has no administration in time range)  fentaNYL (SUBLIMAZE) injection 25 mcg (25 mcg Intravenous Given 08/28/18 1425)  sodium chloride 0.9 % bolus 1,000 mL (1,000 mLs Intravenous New Bag/Given 08/28/18 1424)     Procedures Critical Care Critical Care Documentation Critical care time provided by me (excluding procedures): 35 minutes  Condition necessitating critical care: Severe hypernatremia  Components of critical care management: reviewing of prior records, laboratory and imaging interpretation, frequent re-examination and reassessment of vital signs, administration of IV fluids, discussion with consulting services    ED Course and Medical Decision Making  I have reviewed the triage vital signs and the nursing notes.  Pertinent labs & imaging results that were available during my care of the patient were reviewed by me and considered in my medical decision making (see below for details).  Focal abdominal tenderness in this 73 year old female with history  of dementia, report of hypoxia and possible cyanosis but no evidence of cyanosis on my exam, saturations 95% on room air, no increased work of breathing.  Patient appears anxious, possibly in pain.  Providing fluids, IV fentanyl, will need CT imaging to exclude appendicitis or other acute or emergent intra-abdominal process.  Labs reveal profound hypernatremia and hyperchloremia suggesting severe dehydration, also with severe AKI.  CT  abdomen is still pending to exclude surgical emergency, admitted to hospital service for further care.  Stepdown unit.  Barth Kirks. Sedonia Small, MD [place] [email or web address]  Final Clinical Impressions(s) / ED Diagnoses     ICD-10-CM   1. Hypernatremia E87.0   2. Hypoxia R09.02 DG Chest [facility]    DG Chest [facility]    ED Discharge Orders    None         Maudie Flakes, MD 08/28/18 [phone]

## 2018-08-28 NOTE — ED Notes (Signed)
ED TO INPATIENT HANDOFF REPORT  ED Nurse Name and Phone #: Annamary Carolin, RN [phone]  S Name/Age/Gender Donna Christen 73 y.o. female Room/Bed: 032C/032C  Code Status   Code Status: DNR  Home/SNF/Other Home Patient oriented to: self Is this baseline? Yes   Triage Complete: Triage complete  Chief Complaint altered  Triage Note Per EMS- pt has baseline dementia and alzheimer. Pt husband is healthcare power of attorney, reports she is more altered than normal. BP initially hypotensive. 18G PIV placed to left wrist by EMS. Pt stools have been black and tarry. [name]- HCPOA states she normally walks without difficulty or assistance device but since Tuesday she has just been lying around and not talking.    Allergies No Known Allergies  Level of Care/Admitting Diagnosis ED Disposition    ED Disposition Condition Comment   Admit  Hospital Area: [facility] [[id number]]  Level of Care: Progressive [102]  Covid Evaluation: N/A  Diagnosis: Dehydration with hypernatremia [[id number]]  Admitting Physician: Karmen Bongo [[id number]]  Attending Physician: Karmen Bongo [[id number]]  Estimated length of stay: 3 - 4 days  Certification:: I certify this patient will need inpatient services for at least 2 midnights  PT Class (Do Not Modify): Inpatient [101]  PT Acc Code (Do Not Modify): Private [1]       B Medical/Surgery History Past Medical History:  Diagnosis Date  . Dementia ([place])   . Hypertension   . UTI (urinary tract infection) 03/03/2018   Past Surgical History:  Procedure Laterality Date  . APPENDECTOMY    . TONSILLECTOMY    . TUBAL LIGATION       A IV Location/Drains/Wounds Patient Lines/Drains/Airways Status   Active Line/Drains/Airways    Name:   Placement date:   Placement time:   Site:   Days:   Peripheral IV 08/28/18 Left Wrist   08/28/18    1424    Wrist   less than 1          Intake/Output Last 24 hours No intake or output data in the 24 hours ending  08/28/18 1725  Labs/Imaging Results for orders placed or performed during the hospital encounter of 08/28/18 (from the past 48 hour(s))  POC occult blood, ED     Status: None   Collection Time: 08/28/18  1:35 PM  Result Value Ref Range   Fecal Occult Bld NEGATIVE NEGATIVE  CBC     Status: Abnormal   Collection Time: 08/28/18  2:10 PM  Result Value Ref Range   WBC 11.6 (H) 4.0 - 10.5 K/uL   RBC 3.93 3.87 - 5.11 MIL/uL   Hemoglobin 12.5 12.0 - 15.0 g/dL   HCT 40.0 36.0 - 46.0 %   MCV 101.8 (H) 80.0 - 100.0 fL   MCH 31.8 26.0 - 34.0 pg   MCHC 31.3 30.0 - 36.0 g/dL   RDW 15.9 (H) 11.5 - 15.5 %   Platelets 103 (L) 150 - 400 K/uL    Comment: REPEATED TO VERIFY PLATELET COUNT CONFIRMED BY SMEAR SPECIMEN CHECKED FOR CLOTS Immature Platelet Fraction may be clinically indicated, consider ordering this additional test [id number]    nRBC 0.3 (H) 0.0 - 0.2 %    Comment: Performed at [facility], [address], [place], [place] [address]  Comprehensive metabolic panel     Status: Abnormal   Collection Time: 08/28/18  2:10 PM  Result Value Ref Range   Sodium 173 (HH) 135 - 145 mmol/L    Comment: CRITICAL RESULT  CALLED TO, READ BACK BY AND VERIFIED WITH: ANNA REABOLD RN AT 1520 08/28/2018 BY WOOLLENK    Potassium 5.1 3.5 - 5.1 mmol/L   Chloride >130 (HH) 98 - 111 mmol/L    Comment: CRITICAL RESULT CALLED TO, READ BACK BY AND VERIFIED WITH: ANNA REABOLD RN AT 1520 08/28/2018 BY WOOLLENK    CO2 16 (L) 22 - 32 mmol/L   Glucose, Bld 126 (H) 70 - 99 mg/dL   BUN 151 (H) 8 - 23 mg/dL   Creatinine, Ser 7.99 (H) 0.44 - 1.00 mg/dL   Calcium 9.9 8.9 - 10.3 mg/dL   Total Protein 7.3 6.5 - 8.1 g/dL   Albumin 3.3 (L) 3.5 - 5.0 g/dL   AST 84 (H) 15 - 41 U/L   ALT 74 (H) 0 - 44 U/L   Alkaline Phosphatase 79 38 - 126 U/L   Total Bilirubin 0.7 0.3 - 1.2 mg/dL   GFR calc non Af Amer 5 (L) >60 mL/min   GFR calc Af Amer 5 (L) >60 mL/min   Anion gap NOT CALCULATED 5 - 15    Comment: Performed  at [facility], [place] [address], [place], [place] [address]  Troponin I - ONCE - STAT     Status: Abnormal   Collection Time: 08/28/18  2:10 PM  Result Value Ref Range   Troponin I 0.06 (HH) <0.03 ng/mL    Comment: CRITICAL RESULT CALLED TO, READ BACK BY AND VERIFIED WITH: Lovenia Shuck RN AT 1520 08/28/2018 BY [facility] Performed at [facility], [place] [address], [place], [place] [address]   Lactic acid, plasma     Status: Abnormal   Collection Time: 08/28/18  2:10 PM  Result Value Ref Range   Lactic Acid, Venous 2.4 (HH) 0.5 - 1.9 mmol/L    Comment: CRITICAL RESULT CALLED TO, READ BACK BY AND VERIFIED WITH: A REABOLD,RN AT 1447 08/28/2018 BY L BENFIELD Performed at [facility], [place] [address], [place], [place] [address]    Ct Abdomen Pelvis Wo Contrast  Result Date: 08/28/2018 CLINICAL DATA:  73 year old with dementia who has acute mental status changes and recent stools that have been black and tarry. Surgical history includes appendectomy and tubal ligation. EXAM: CT ABDOMEN AND PELVIS WITHOUT CONTRAST TECHNIQUE: Multidetector CT imaging of the abdomen and pelvis was performed following the standard protocol without IV contrast. COMPARISON:  None. FINDINGS: Beam hardening streak artifact is present on many of the images as the patient was unable to raise the arms. Lower chest: Heart size normal.  Visualized lung bases clear. Hepatobiliary: Allowing for the beam hardening streak artifact, no focal abnormalities involving the liver, allowing for the unenhanced technique. Gallbladder normal in appearance without calcified gallstones. No biliary ductal dilation. Pancreas: Allowing for the beam hardening streak artifact, normal unenhanced appearance. Spleen: Allowing for the beam hardening streak artifact, normal unenhanced appearance. Adrenals/Urinary Tract: Normal appearing adrenal glands. Non-obstructing approximate 4 mm calculus in a mid calyx of the LEFT kidney. No urinary  tract calculi elsewhere on either side. Allowing for the beam hardening streak artifact and the unenhanced technique, no focal parenchymal abnormality involving either kidney. Normal appearing urinary bladder (which is displaced anteriorly by a massively distended stool-filled rectum). Stomach/Bowel: Stomach normal in appearance for the degree of distention. Normal-appearing small bowel. Several loops of jejunum in the LEFT UPPER QUADRANT are LATERAL to the descending colon. Massively distended stool-filled rectum with circumferential wall thickening of the distal rectum near the anal verge. Moderate  colonic stool burden elsewhere. Sigmoid colon elongated and tortuous. No focal colonic abnormalities otherwise. Surgically absent appendix. Vascular/Lymphatic: Mild aortic atherosclerosis without evidence of aneurysm. No pathologic lymphadenopathy. Reproductive: Normal appearing uterus displaced anteriorly by the massively distended rectum. No adnexal masses. Other: Numerous pelvic phleboliths. Musculoskeletal: Facet degenerative changes involving the LOWER lumbar spine with degenerative grade 1-2 spondylolisthesis of L4 on L5 measuring approximately 11 mm. Irregularity involving the LOWER endplate of L5 is likely chronic and related to degenerative changes. IMPRESSION: 1. Massively distended rectum filled with stool with circumferential wall thickening involving the distal rectum near the anal verge, suspect fecal impaction and possible proctitis. 2. No acute abnormalities otherwise involving the abdomen or pelvis. 3. Nonobstructing 4 mm calculus in a mid calyx of the left kidney. 4. Several loops of jejunum in the left upper quadrant are LATERAL to the descending colon raising the question of an internal hernia. No evidence of small bowel obstruction. Electronically Signed   By: Evangeline Dakin M.D.   On: 08/28/2018 16:41   Dg Chest Port 1 View  Result Date: 08/28/2018 CLINICAL DATA:  Current history of dementia,  presenting with acute mental status changes and hypoxia. Current history of hypertension. EXAM: PORTABLE CHEST 1 VIEW COMPARISON:  11/28/2016, 03/11/2012. FINDINGS: Cardiac silhouette normal in size for AP portable technique, unchanged. Lungs clear. Bronchovascular markings normal. Pulmonary vascularity normal. No visible pleural effusions. No pneumothorax. IMPRESSION: No acute cardiopulmonary disease. Electronically Signed   By: Evangeline Dakin M.D.   On: 08/28/2018 15:03    Pending Labs Unresulted Labs (From admission, onward)    Start     Ordered   08/29/18 0500  CBC  Tomorrow morning,   R     08/28/18 1719   08/28/18 2956  Basic metabolic panel  Now then every 6 hours,   R     08/28/18 1719   08/28/18 1714  Osmolality, urine  Once,   R     08/28/18 1719   08/28/18 1714  Osmolality  Once,   R     08/28/18 1719   08/28/18 1713  Culture, Urine  Once,   R     08/28/18 1719   08/28/18 1354  Urinalysis, Routine w reflex microscopic  ONCE - STAT,   R     08/28/18 1354          Vitals/Pain Today's Vitals   08/28/18 1330 08/28/18 1339 08/28/18 1345 08/28/18 1405  BP: 118/89 118/89    Pulse: (!) 54 90 85 94  Resp:  17 (!) 24 17  Temp:  97.9 F (36.6 C)    TempSrc:  Rectal    SpO2: (!) 88% (!) 88%  95%    Isolation Precautions No active isolations  Medications Medications  atropine 1 % ophthalmic solution 1 drop (has no administration in time range)  latanoprost (XALATAN) 0.005 % ophthalmic solution 1 drop (has no administration in time range)  ketorolac (ACULAR) 0.5 % ophthalmic solution 1 drop (has no administration in time range)  dorzolamide-timolol (COSOPT) 22.3-6.8 MG/ML ophthalmic solution 1 drop (has no administration in time range)  fluorometholone (FML) 0.1 % ophthalmic suspension 1 drop (has no administration in time range)  acetaminophen (TYLENOL) tablet 650 mg (has no administration in time range)    Or  acetaminophen (TYLENOL) suppository 650 mg (has no  administration in time range)  ondansetron (ZOFRAN) tablet 4 mg (has no administration in time range)    Or  ondansetron (ZOFRAN) injection 4 mg (has no administration in  time range)  enoxaparin (LOVENOX) injection 30 mg (has no administration in time range)  sodium chloride flush (NS) 0.9 % injection 3 mL (has no administration in time range)  dextrose 5 % solution (has no administration in time range)  docusate sodium (COLACE) capsule 100 mg (has no administration in time range)  polyethylene glycol (MIRALAX / GLYCOLAX) packet 17 g (has no administration in time range)  bisacodyl (DULCOLAX) suppository 10 mg (has no administration in time range)  sodium phosphate (FLEET) 7-19 GM/118ML enema 1 enema (has no administration in time range)  fentaNYL (SUBLIMAZE) injection 25 mcg (25 mcg Intravenous Given 08/28/18 1425)  sodium chloride 0.9 % bolus 1,000 mL (1,000 mLs Intravenous New Bag/Given 08/28/18 1424)  sodium chloride 0.9 % bolus 1,000 mL (0 mLs Intravenous Stopped 08/28/18 1709)    Mobility walks with device High fall risk   Focused Assessments Cardiac Assessment Handoff:    Lab Results  Component Value Date   TROPONINI 0.06 ([place]) 08/28/2018   No results found for: DDIMER Does the Patient currently have chest pain? No     R Recommendations: See Admitting Provider Note  Report given to:   Additional Notes:

## 2018-08-28 NOTE — ED Triage Notes (Addendum)
Per EMS- pt has baseline dementia and alzheimer. Pt husband is healthcare power of attorney, reports she is more altered than normal. BP initially hypotensive. 18G PIV placed to left wrist by EMS. Pt stools have been black and tarry. Tanya Jensen- HCPOA states she normally walks without difficulty or assistance device but since Tuesday she has just been lying around and not talking.

## 2018-08-28 NOTE — Progress Notes (Signed)
CRITICAL VALUE ALERT  Critical Value:   Osmolality: 407 Sodium: 172 Chloride: > 130  Date & Time Notied:  08/28/2018 1936  Provider Notified: Kennon Holter, MD notified  Orders Received/Actions taken: Awaiting orders

## 2018-08-29 ENCOUNTER — Encounter (HOSPITAL_COMMUNITY): Payer: Self-pay | Admitting: General Practice

## 2018-08-29 ENCOUNTER — Other Ambulatory Visit: Payer: Self-pay

## 2018-08-29 DIAGNOSIS — G309 Alzheimer's disease, unspecified: Secondary | ICD-10-CM

## 2018-08-29 DIAGNOSIS — F0281 Dementia in other diseases classified elsewhere with behavioral disturbance: Secondary | ICD-10-CM

## 2018-08-29 DIAGNOSIS — N179 Acute kidney failure, unspecified: Secondary | ICD-10-CM

## 2018-08-29 DIAGNOSIS — F0391 Unspecified dementia with behavioral disturbance: Secondary | ICD-10-CM

## 2018-08-29 DIAGNOSIS — G9341 Metabolic encephalopathy: Secondary | ICD-10-CM

## 2018-08-29 DIAGNOSIS — I10 Essential (primary) hypertension: Secondary | ICD-10-CM

## 2018-08-29 DIAGNOSIS — Z515 Encounter for palliative care: Secondary | ICD-10-CM

## 2018-08-29 LAB — BASIC METABOLIC PANEL
BUN: 127 mg/dL — ABNORMAL HIGH (ref 8–23)
BUN: 105 mg/dL — ABNORMAL HIGH (ref 8–23)
CO2: 15 mmol/L — ABNORMAL LOW (ref 22–32)
CO2: 20 mmol/L — ABNORMAL LOW (ref 22–32)
Calcium: 9.1 mg/dL (ref 8.9–10.3)
Calcium: 9.1 mg/dL (ref 8.9–10.3)
Chloride: >130 mmol/L (ref 98–111)
Chloride: >130 mmol/L (ref 98–111)
Creatinine, Ser: 5.7 mg/dL — ABNORMAL HIGH (ref 0.44–1.00)
Creatinine, Ser: 3.79 mg/dL — ABNORMAL HIGH (ref 0.44–1.00)
GFR calc Af Amer: 8 mL/min — ABNORMAL LOW (ref >60)
GFR calc Af Amer: 13 mL/min — ABNORMAL LOW (ref >60)
GFR calc non Af Amer: 7 mL/min — ABNORMAL LOW (ref >60)
GFR calc non Af Amer: 11 mL/min — ABNORMAL LOW (ref >60)
Glucose, Bld: 166 mg/dL — ABNORMAL HIGH (ref 70–99)
Glucose, Bld: 101 mg/dL — ABNORMAL HIGH (ref 70–99)
Potassium: 4.6 mmol/L (ref 3.5–5.1)
Potassium: 4.6 mmol/L (ref 3.5–5.1)
Sodium: 170 mmol/L (ref 135–145)
Sodium: 169 mmol/L (ref 135–145)

## 2018-08-29 LAB — CBC
HCT: 36.2 % (ref 36.0–46.0)
Hemoglobin: 11.7 g/dL — ABNORMAL LOW (ref 12.0–15.0)
MCH: 31.8 pg (ref 26.0–34.0)
MCHC: 32.3 g/dL (ref 30.0–36.0)
MCV: 98.4 fL (ref 80.0–100.0)
Platelets: 86 10*3/uL — ABNORMAL LOW (ref 150–400)
RBC: 3.68 MIL/uL — ABNORMAL LOW (ref 3.87–5.11)
RDW: 15.9 % — ABNORMAL HIGH (ref 11.5–15.5)
WBC: 10.3 10*3/uL (ref 4.0–10.5)
nRBC: 0.3 % — ABNORMAL HIGH (ref 0.0–0.2)

## 2018-08-29 LAB — BLOOD GAS, ARTERIAL
Acid-base deficit: 10 mmol/L — ABNORMAL HIGH (ref 0.0–2.0)
Bicarbonate: 14.3 mmol/L — ABNORMAL LOW (ref 20.0–28.0)
Drawn by: 517021
FIO2: 100
O2 Saturation: 99.5 %
Patient temperature: 98.2
pCO2 arterial: 25.5 mmHg — ABNORMAL LOW (ref 32.0–48.0)
pH, Arterial: 7.368 (ref 7.350–7.450)
pO2, Arterial: 428 mmHg — ABNORMAL HIGH (ref 83.0–108.0)

## 2018-08-29 LAB — LACTIC ACID, PLASMA: Lactic Acid, Venous: 1.9 mmol/L (ref 0.5–1.9)

## 2018-08-29 MED ORDER — DEXTROSE 5 % IV SOLN
INTRAVENOUS | Status: DC
  Administered 2018-08-29: 23:00:00 via INTRAVENOUS

## 2018-08-29 MED ORDER — SODIUM CHLORIDE 0.45 % IV SOLN
INTRAVENOUS | Status: DC
  Administered 2018-08-29: 14:00:00 via INTRAVENOUS

## 2018-08-29 MED ORDER — SODIUM CHLORIDE 0.9 % IV SOLN
1.0000 g | INTRAVENOUS | Status: DC
  Administered 2018-08-29: 17:00:00 1 g via INTRAVENOUS
  Filled 2018-08-29 (×2): qty 1

## 2018-08-29 MED ORDER — VANCOMYCIN VARIABLE DOSE PER UNSTABLE RENAL FUNCTION (PHARMACIST DOSING): Status: DC

## 2018-08-29 MED ORDER — METRONIDAZOLE IN NACL 5-0.79 MG/ML-% IV SOLN
500.0000 mg | Freq: Three times a day (TID) | INTRAVENOUS | Status: DC
  Administered 2018-08-29 – 2018-09-01 (×9): 500 mg via INTRAVENOUS
  Filled 2018-08-29 (×9): qty 100

## 2018-08-29 MED ORDER — TRAZODONE HCL 50 MG PO TABS
50.0000 mg | ORAL_TABLET | Freq: Every evening | ORAL | Status: DC | PRN
  Administered 2018-08-29 – 2018-09-01 (×4): 50 mg via ORAL
  Filled 2018-08-29 (×4): qty 1

## 2018-08-29 MED ORDER — VANCOMYCIN HCL 10 G IV SOLR
1250.0000 mg | Freq: Once | INTRAVENOUS | Status: AC
  Administered 2018-08-29: 19:00:00 1250 mg via INTRAVENOUS
  Filled 2018-08-29: qty 1250

## 2018-08-29 MED ORDER — SODIUM CHLORIDE 0.9 % IV BOLUS
1000.0000 mL | Freq: Once | INTRAVENOUS | Status: AC
  Administered 2018-08-29: 1000 mL via INTRAVENOUS

## 2018-08-29 MED ORDER — LACTATED RINGERS IV BOLUS
500.0000 mL | Freq: Once | INTRAVENOUS | Status: AC
  Administered 2018-08-29: 07:00:00 500 mL via INTRAVENOUS

## 2018-08-29 MED ORDER — BISACODYL 10 MG RE SUPP
10.0000 mg | Freq: Once | RECTAL | Status: AC
  Administered 2018-08-30: 10 mg via RECTAL
  Filled 2018-08-29: qty 1

## 2018-08-29 NOTE — Progress Notes (Signed)
Rapid response nurse came by to reassess patient. Advised me to notify if BP decreases or anything else changes.  Monitoring closely.

## 2018-08-29 NOTE — Consult Note (Signed)
Consultation Note Date: 08/29/2018   Patient Name: Tanya Jensen  DOB: 26-Jun-1945  MRN: [id number]  Age / Sex: 73 y.o., female  PCP: Shirline Frees, MD Referring Physician: Edwin Dada, *    Reason for Consultation: Establishing goals of care and Psychosocial/spiritual support    HPI/Patient Profile: 74 y.o. female   admitted on 08/28/2018 with past medical history significant of dementia and HTN presenting with abdominal pain. Her husband reports that at baseline she can walk without assistance; talk sometimes incoherently but generally remembers him; and can feed herself.     He tells me he treated her with supplement "lions mane " with great improvement in her dementia symptoms.    Over the last 2-3 weeks, she has not been eating or drinking well.  He thought maybe she was constipated and so called 911 on Friday; EMS recommended that they try laxatives.  She had a BM and appeared to feel a little better.  She ate Saturday morning, but was sleeping more and fidgeting more throughout the weekend.  Today, he noticed that her fingers were blue on the tips.    She has had weight loss.  Admitted for work-up and stabilization.  Family face treatment options decision, advanced directive decisions, and anticipatory care needs.  Clinical Assessment and Goals of Care:   This NP Wadie Lessen reviewed medical records, received report from team, assessed the patient and then meet at the patient's bedside along with her husband   to discuss diagnosis, prognosis, GOC, EOL wishes disposition and options.  Concept of Hospice and Palliative Care were discussed  A discussion was had today regarding advanced directives.  Concepts specific to code status, artifical feeding and hydration, continued IV antibiotics and rehospitalization was had.    The difference between a aggressive medical intervention path  and a  palliative comfort care path for this patient at this time was had.  Values and goals of care important to patient and family were attempted to be elicited.  MOST form introduced.   Hard Choices booklet  left for review   Questions and concerns addressed.   Family encouraged to call with questions or concerns.    PMT will continue to support holistically.    NEXT OF KIN    SUMMARY OF RECOMMENDATIONS    Code Status/Advance Care Planning:  DNR   Family is open to all offered and available medical interventions to prolong life   Palliative Prophylaxis:   Aspiration, Bowel Regimen, Delirium Protocol, Frequent Pain Assessment and Oral Care  Additional Recommendations (Limitations, Scope, Preferences):  Full Scope Treatment  Psycho-social/Spiritual:   Desire for further Chaplaincy support:yes  Additional Recommendations: Grief/Bereavement Support   Created space and opportunity for husband to explore his thoughts and feelings regarding his wife's current medical situation.  He understands the seriousness of her situation but remains hopeful for continued life.  He speaks to his love for his wife and her love of singing, church and people.  Prognosis:   Unable to determine   Depends  on desire for life prolonging measures; artificial hydration, rehospitalization, use of antibiotics.  Discharge Planning: To Be Determined      Primary Diagnoses: Present on Admission: . Dehydration with hypernatremia . Acute renal failure (ARF) ([place]) . (Resolved) Dementia with behavioral disturbance ([place]) . Lewy body dementia with behavioral disturbance ([place]) . Open-angle glaucoma, severe stage . Essential hypertension   I have reviewed the medical record, interviewed the patient and family, and examined the patient. The following aspects are pertinent.  Past Medical History:  Diagnosis Date  . Dementia ([place])   . Hypertension   . UTI (urinary tract infection) 03/03/2018   Social  History   Socioeconomic History  . Marital status: Married    Spouse name: Tanya Jensen  . Number of children: 3  . Years of education: 21  . Highest education level: Not on file  Occupational History  . Not on file  Social Needs  . Financial resource strain: Not on file  . Food insecurity:    Worry: Not on file    Inability: Not on file  . Transportation needs:    Medical: Not on file    Non-medical: Not on file  Tobacco Use  . Smoking status: Never Smoker  . Smokeless tobacco: Never Used  Substance and Sexual Activity  . Alcohol use: No  . Drug use: No  . Sexual activity: Not on file  Lifestyle  . Physical activity:    Days per week: Not on file    Minutes per session: Not on file  . Stress: Not on file  Relationships  . Social connections:    Talks on phone: Not on file    Gets together: Not on file    Attends religious service: Not on file    Active member of club or organization: Not on file    Attends meetings of clubs or organizations: Not on file    Relationship status: Not on file  Other Topics Concern  . Not on file  Social History Narrative   Lives at home with husband, Tanya Jensen.   Right handed   Drinks green tea   Family History  Problem Relation Age of Onset  . Dementia Mother    Scheduled Meds: . atropine  1 drop Both Eyes Daily  . docusate sodium  100 mg Oral BID  . dorzolamide-timolol  1 drop Both Eyes BID  . enoxaparin (LOVENOX) injection  30 mg Subcutaneous QHS  . fluorometholone  1 drop Both Eyes BID  . ketorolac  1 drop Both Eyes BID  . latanoprost  1 drop Both Eyes QHS  . sodium chloride flush  3 mL Intravenous Q12H  . vancomycin variable dose per unstable renal function (pharmacist dosing)   Does not apply See admin instructions   Continuous Infusions: . sodium chloride 125 mL/hr at 08/29/18 1429  . ceFEPime (MAXIPIME) IV    . metronidazole 500 mg (08/29/18 1442)  . vancomycin     PRN Meds:.acetaminophen **OR** acetaminophen, bisacodyl,  ondansetron **OR** ondansetron (ZOFRAN) IV, polyethylene glycol Medications Prior to Admission:  Prior to Admission medications   Medication Sig Start Date End Date Taking? Authorizing Provider  atropine 1 % ophthalmic solution Place 1 drop into both eyes daily.    Yes [provider]  bimatoprost (LUMIGAN) 0.03 % ophthalmic solution Place 1 drop into both eyes at bedtime.   Yes [provider]  Bromfenac Sodium (PROLENSA) 0.07 % SOLN Apply 1 drop to eye 2 (two) times daily.    Yes  [provider]  dorzolamide-timolol (COSOPT) 22.3-6.8 MG/ML ophthalmic solution Place 1 drop into both eyes 2 (two) times daily.   Yes [provider]  fluorometholone (FML) 0.1 % ophthalmic suspension Place 1 drop into both eyes 2 (two) times daily.  04/18/17  Yes [provider]  lisinopril (PRINIVIL,ZESTRIL) 20 MG tablet Take 20 mg by mouth daily.   Yes [provider]  OVER THE COUNTER MEDICATION Take 1 tablet by mouth 2 (two) times daily. [facility]*   Yes [provider]  traZODone (DESYREL) 150 MG tablet Take 150 mg by mouth at bedtime.   Yes [provider]   No Known Allergies Review of Systems  Unable to perform ROS   Physical Exam Constitutional:      Appearance: She is cachectic.  Cardiovascular:     Rate and Rhythm: Normal rate and regular rhythm.     Heart sounds: Normal heart sounds.  Musculoskeletal:     Comments: generalized weakness and muscle atrophy  Skin:    General: Skin is warm and dry.     Vital Signs: BP 97/82 (BP Location: Left Arm)   Pulse 67   Temp 97.6 F (36.4 C) (Axillary)   Resp 17   SpO2 98%  Pain Scale: PAINAD   Pain Score: 0-No pain   SpO2: SpO2: 98 % O2 Device:SpO2: 98 % O2 Flow Rate: .O2 Flow Rate (L/min): 2 L/min  IO: Intake/output summary:   Intake/Output Summary (Last 24 hours) at 08/29/2018 1446 Last data filed at 08/29/2018 0700 Gross per 24 hour  Intake 1977.54 ml  Output -   Net 1977.54 ml    LBM: Last BM Date: 08/29/18 Baseline Weight:   Most recent weight:       Palliative Assessment/Data: 30 %   Discussed with Dr. Loleta Books  Time In: 1345 Time Out: 1500 Time Total: 70 minutes Greater than 50%  of this time was spent counseling and coordinating care related to the above assessment and plan.  Signed by: Wadie Lessen, NP   Please contact Palliative Medicine Team phone at [phone] for questions and concerns.  For individual provider: See Shea Evans

## 2018-08-29 NOTE — Progress Notes (Signed)
Patient's BP 83/54 with a MAP of 64. NP notified. 500 cc bolus of LR ordered. LR is infusing.

## 2018-08-29 NOTE — Progress Notes (Signed)
Notified NP that the ABG results were in. NP said to place her on nasal cannula since she appears to be oxygenating fine.   Contacted rapid response nurse to ask how many liters of O2 the patient should be on - Advised to place patient on 2-4L since she was originally okay with 2L. Rapid response nurse Lexine Baton, sounded concerned about bicarb level, stated she would follow up.   Patient is now awake and singing, she is still not communicating with me or responding to my questions appropriately but husband says she has been like this for a while.   Monitoring closely.

## 2018-08-29 NOTE — Progress Notes (Signed)
CRITICAL VALUE ALERT  Critical Value:  Sodium 169 & chloride >130  Date & Time Notied:  08/29/18 @1800   Provider Notified: MD notified  Orders Received/Actions taken: will await any new orders

## 2018-08-29 NOTE — Progress Notes (Signed)
Pharmacy Antibiotic Note  Tanya Jensen is a 73 y.o. female admitted on 08/28/2018 with Sepsis and AKI.  Pharmacy has been consulted for cefepime and vancomycin dosing. Previous weight in system in December was 63.5kg, Current Scr 5.7 for CrCl <10 ml/min   Plan: Vancomycin 1250mg  IV x 1, with variable dosing based on future levels Change zosyn to cefepime 1gm IV q24h and flagyl 500mg  IV q8h Monitor levels as needed Monitor for clinical course, deescalation, and LOT      Temp (24hrs), Avg:98.2 F (36.8 C), Min:97.6 F (36.4 C), Max:98.9 F (37.2 C)  Recent Labs  Lab 08/28/18 1410 08/28/18 1836 08/29/18 0306  WBC 11.6*  --  10.3  CREATININE 7.99* 6.82* 5.70*  LATICACIDVEN 2.4*  --   --     CrCl cannot be calculated (Unknown ideal weight.).    No Known Allergies  Tanya Jensen A. Levada Dy, PharmD, [place] Please utilize Amion for appropriate phone number to reach the unit pharmacist ([place])   08/29/2018 1:59 PM

## 2018-08-29 NOTE — Progress Notes (Signed)
Patient was drowsy around 1925 when I first seen the patient. When I went back in she was able to nod her head and open her eyes which is how the husband she has been lately.  Around 2225 the nurse tech called to tell me the patient's oxygen level was in the 60s-70s. She was on 2L [place] at that time. We increased her oxygen and placed her on a NRB. RRT was called. MD was paged.  Hard to get a good reading for the oxygen level. Once we got a good dreading it showed in the 90s.   Bipap ordered PRN. ABG ordered.   Monitoring closely.

## 2018-08-29 NOTE — Progress Notes (Signed)
Patient may be given bedside swallow eval with water.

## 2018-08-29 NOTE — Progress Notes (Addendum)
PROGRESS NOTE    Tanya Jensen  ACZ:[id number] DOB: 05/21/1945 DOA: 08/28/2018 PCP: Shirline Frees, MD      Brief Narrative:  Tanya Jensen is a 73 y.o. F with advanced dementia, home-dwelling and HTN who presents with decreased intake, confusion.  In ER, found to have Cr 7.99 mg/dL, K normal, BUN 151, hypotension, Na 171 and thrombocytopenia.      Assessment & Plan:  Acute renal failure Cr 7.99 mg/dL at admission, down to 5 now.   Na and Cr suggest a chronicity of process >1 week.  Response to fluids encouraging.   -Hold ACEi -IV fluids -Renal dose vancomycin -Avoid nephrotoxins or hypotension -Check urine electrolytes and UA with microscopy   Dehydration Hypernatremia Free water deficit >5L.  -IV fluids 1/2 NS at 125 ml/hr which was calculated to correct Na at rate no greater than 0.5 mmol/L/hr -Trend Na q12hrs  Hypotension Essential hypertension CXR clear -Hold antihypertensives -Continue IV fluids -Check lactic acid and blood and urine cultures -Start empiric vancomycin and cefepime  Thrombocytopenia -Trend CBC -Hold Lovenox  Dementia  Glaucoma -Continue eye drops  Acute hypoxic respiratory failure Unclear cause.  CXR clear.  -Continue supplemental O2      MDM and disposition: The below labs and imaging reports were reviewed and summarized above.  Medication management as above.  The patient was admitted with acute renal failure, acute neurogical change (at baseline is able to converse forgetfully, currently somnolent and comatose), as well as hypotension.  An extensive number of diagnoses or treatment options were managed.           DVT prophylaxis: SCDs Code Status: DO NOT RESUSCITATE Family Communication: Husband at bedside    Consultants:   Palliative Care  Procedures:   None  Antimicrobials:   Vancomycin 4/27 >>  Cefepime 4/27 >>  Flagyl 4/27 >>      Subjective: Making dark cloudy urine only.  Mentation only  mumbling.    Objective: Vitals:   08/29/18 0837 08/29/18 0945 08/29/18 1022 08/29/18 1115  BP: 94/82 (!) 123/98 90/62 97/82   Pulse: 74 73 73 67  Resp: 16 17 13 17   Temp:      TempSrc:      SpO2: 100% 100% 100% 98%    Intake/Output Summary (Last 24 hours) at 08/29/2018 1546 Last data filed at 08/29/2018 0700 Gross per 24 hour  Intake 1977.54 ml  Output -  Net 1977.54 ml   There were no vitals filed for this visit.  Examination: General appearance: Frail elderly adult female, eyes open and staring, not responsive to verbal stimuli  HEENT: Eyes sunken, sclerae anicteric, conjunctiva pink, lids and lashes normal. No nasal deformity, discharge, epistaxis.  Lips dry, OP dry, OP mucosa dry, no oral lesions.   Skin: Dry tented.  No rashes on skin. Cardiac: Tachycardic, regular, nl S1-S2, no murmurs appreciated.  Capillary refill is slow, fingers blue.  No LE edema.  Radial pulses weak and thready Respiratory: Tachypneic, no rales or wheezes. Abdomen: Abdomen soft.  Grimace throughout abdomen to palpation, no gaurding or rebound or rigidity.  No ascites or distension. MSK: Diffuse severe loss of subcutaneous muscle mass and fat. Neuro: Somnolent, shifts when disturbed, no intelligible speech, does not follow commmands, PERRL.  Speech gibbering and incoherent. Psych: Unable to assess.    Data Reviewed: I have personally reviewed following labs and imaging studies:  CBC: Recent Labs  Lab 08/28/18 1410 08/29/18 0306  WBC 11.6* 10.3  HGB 12.5 11.7*  HCT 40.0  36.2  MCV 101.8* 98.4  PLT 103* 86*   Basic Metabolic Panel: Recent Labs  Lab 08/28/18 1410 08/28/18 1836 08/29/18 0306  NA 173* 172* 170*  K 5.1 4.8 4.6  CL >130* >130* >130*  CO2 16* 15* 15*  GLUCOSE 126* 115* 166*  BUN 151* 140* 127*  CREATININE 7.99* 6.82* 5.70*  CALCIUM 9.9 9.0 9.1   GFR: CrCl cannot be calculated (Unknown ideal weight.). Liver Function Tests: Recent Labs  Lab 08/28/18 1410  AST 84*   ALT 74*  ALKPHOS 79  BILITOT 0.7  PROT 7.3  ALBUMIN 3.3*   No results for input(s): LIPASE, AMYLASE in the last 168 hours. No results for input(s): AMMONIA in the last 168 hours. Coagulation Profile: No results for input(s): INR, PROTIME in the last 168 hours. Cardiac Enzymes: Recent Labs  Lab 08/28/18 1410  TROPONINI 0.06*   BNP (last 3 results) No results for input(s): PROBNP in the last 8760 hours. HbA1C: No results for input(s): HGBA1C in the last 72 hours. CBG: No results for input(s): GLUCAP in the last 168 hours. Lipid Profile: No results for input(s): CHOL, HDL, LDLCALC, TRIG, CHOLHDL, LDLDIRECT in the last 72 hours. Thyroid Function Tests: No results for input(s): TSH, T4TOTAL, FREET4, T3FREE, THYROIDAB in the last 72 hours. Anemia Panel: No results for input(s): VITAMINB12, FOLATE, FERRITIN, TIBC, IRON, RETICCTPCT in the last 72 hours. Urine analysis:    Component Value Date/Time   COLORURINE YELLOW 03/04/2018 0130   APPEARANCEUR CLEAR 03/04/2018 0130   LABSPEC 1.012 03/04/2018 0130   PHURINE 6.0 03/04/2018 0130   GLUCOSEU NEGATIVE 03/04/2018 0130   HGBUR SMALL (A) 03/04/2018 0130   BILIRUBINUR NEGATIVE 03/04/2018 0130   KETONESUR NEGATIVE 03/04/2018 0130   PROTEINUR NEGATIVE 03/04/2018 0130   UROBILINOGEN 1.0 01/23/2015 1656   NITRITE POSITIVE (A) 03/04/2018 0130   LEUKOCYTESUR TRACE (A) 03/04/2018 0130   Sepsis Labs: @LABRCNTIP (procalcitonin:4,lacticacidven:4)  )No results found for this or any previous visit (from the past 240 hour(s)).       Radiology Studies: Ct Abdomen Pelvis Wo Contrast  Result Date: 08/28/2018 CLINICAL DATA:  73 year old with dementia who has acute mental status changes and recent stools that have been black and tarry. Surgical history includes appendectomy and tubal ligation. EXAM: CT ABDOMEN AND PELVIS WITHOUT CONTRAST TECHNIQUE: Multidetector CT imaging of the abdomen and pelvis was performed following the standard  protocol without IV contrast. COMPARISON:  None. FINDINGS: Beam hardening streak artifact is present on many of the images as the patient was unable to raise the arms. Lower chest: Heart size normal.  Visualized lung bases clear. Hepatobiliary: Allowing for the beam hardening streak artifact, no focal abnormalities involving the liver, allowing for the unenhanced technique. Gallbladder normal in appearance without calcified gallstones. No biliary ductal dilation. Pancreas: Allowing for the beam hardening streak artifact, normal unenhanced appearance. Spleen: Allowing for the beam hardening streak artifact, normal unenhanced appearance. Adrenals/Urinary Tract: Normal appearing adrenal glands. Non-obstructing approximate 4 mm calculus in a mid calyx of the LEFT kidney. No urinary tract calculi elsewhere on either side. Allowing for the beam hardening streak artifact and the unenhanced technique, no focal parenchymal abnormality involving either kidney. Normal appearing urinary bladder (which is displaced anteriorly by a massively distended stool-filled rectum). Stomach/Bowel: Stomach normal in appearance for the degree of distention. Normal-appearing small bowel. Several loops of jejunum in the LEFT UPPER QUADRANT are LATERAL to the descending colon. Massively distended stool-filled rectum with circumferential wall thickening of the distal rectum near the anal  verge. Moderate colonic stool burden elsewhere. Sigmoid colon elongated and tortuous. No focal colonic abnormalities otherwise. Surgically absent appendix. Vascular/Lymphatic: Mild aortic atherosclerosis without evidence of aneurysm. No pathologic lymphadenopathy. Reproductive: Normal appearing uterus displaced anteriorly by the massively distended rectum. No adnexal masses. Other: Numerous pelvic phleboliths. Musculoskeletal: Facet degenerative changes involving the LOWER lumbar spine with degenerative grade 1-2 spondylolisthesis of L4 on L5 measuring  approximately 11 mm. Irregularity involving the LOWER endplate of L5 is likely chronic and related to degenerative changes. IMPRESSION: 1. Massively distended rectum filled with stool with circumferential wall thickening involving the distal rectum near the anal verge, suspect fecal impaction and possible proctitis. 2. No acute abnormalities otherwise involving the abdomen or pelvis. 3. Nonobstructing 4 mm calculus in a mid calyx of the left kidney. 4. Several loops of jejunum in the left upper quadrant are LATERAL to the descending colon raising the question of an internal hernia. No evidence of small bowel obstruction. Electronically Signed   By: Evangeline Dakin M.D.   On: 08/28/2018 16:41   Dg Chest Port 1 View  Result Date: 08/28/2018 CLINICAL DATA:  Current history of dementia, presenting with acute mental status changes and hypoxia. Current history of hypertension. EXAM: PORTABLE CHEST 1 VIEW COMPARISON:  11/28/2016, 03/11/2012. FINDINGS: Cardiac silhouette normal in size for AP portable technique, unchanged. Lungs clear. Bronchovascular markings normal. Pulmonary vascularity normal. No visible pleural effusions. No pneumothorax. IMPRESSION: No acute cardiopulmonary disease. Electronically Signed   By: Evangeline Dakin M.D.   On: 08/28/2018 15:03        Scheduled Meds: . atropine  1 drop Both Eyes Daily  . docusate sodium  100 mg Oral BID  . dorzolamide-timolol  1 drop Both Eyes BID  . enoxaparin (LOVENOX) injection  30 mg Subcutaneous QHS  . fluorometholone  1 drop Both Eyes BID  . ketorolac  1 drop Both Eyes BID  . latanoprost  1 drop Both Eyes QHS  . sodium chloride flush  3 mL Intravenous Q12H  . vancomycin variable dose per unstable renal function (pharmacist dosing)   Does not apply See admin instructions   Continuous Infusions: . sodium chloride 125 mL/hr at 08/29/18 1429  . ceFEPime (MAXIPIME) IV    . metronidazole 500 mg (08/29/18 1442)  . vancomycin       LOS: 1 day     Time spent: 35 minutes The patient is critically ill with multi-organ failure.  Critical care was necessary to treat or prevent imminent or life-threatening deterioration of hypotension, respiratory failure, renal failure and was exclusive of separately billable procedures and treating other patients. Total critical care time spent by me: 35 minutes Time spent personally by me on obtaining history from patient or surrogate, evaluation of the patient, evaluation of patient's response to treatment, ordering and review of laboratory studies, ordering and review of radiographic studies, ordering and performing treatments and interventions, and re-evaluation of the patient's condition.     Edwin Dada, MD [facility] 08/29/2018, 3:46 PM     Please page through [place]:  [email or web address] Password [email or web address] If 7PM-7AM, please contact night-coverage

## 2018-08-29 NOTE — Progress Notes (Signed)
CRITICAL VALUE ALERT  Critical Value: Sodium 170 Chloride: >130  Date & Time Notied:  08/29/2018 0405  Provider Notified: Kennon Holter, NP notified  Orders Received/Actions taken: Awaiting orders

## 2018-08-30 DIAGNOSIS — R627 Adult failure to thrive: Secondary | ICD-10-CM

## 2018-08-30 DIAGNOSIS — Z515 Encounter for palliative care: Secondary | ICD-10-CM

## 2018-08-30 LAB — BASIC METABOLIC PANEL
BUN: 76 mg/dL — ABNORMAL HIGH (ref 8–23)
BUN: 61 mg/dL — ABNORMAL HIGH (ref 8–23)
CO2: 18 mmol/L — ABNORMAL LOW (ref 22–32)
CO2: 13 mmol/L — ABNORMAL LOW (ref 22–32)
Calcium: 8.6 mg/dL — ABNORMAL LOW (ref 8.9–10.3)
Calcium: 8.7 mg/dL — ABNORMAL LOW (ref 8.9–10.3)
Chloride: >130 mmol/L (ref 98–111)
Chloride: >130 mmol/L (ref 98–111)
Creatinine, Ser: 2.61 mg/dL — ABNORMAL HIGH (ref 0.44–1.00)
Creatinine, Ser: 1.96 mg/dL — ABNORMAL HIGH (ref 0.44–1.00)
GFR calc Af Amer: 21 mL/min — ABNORMAL LOW (ref >60)
GFR calc Af Amer: 29 mL/min — ABNORMAL LOW (ref >60)
GFR calc non Af Amer: 18 mL/min — ABNORMAL LOW (ref >60)
GFR calc non Af Amer: 25 mL/min — ABNORMAL LOW (ref >60)
Glucose, Bld: 155 mg/dL — ABNORMAL HIGH (ref 70–99)
Glucose, Bld: 122 mg/dL — ABNORMAL HIGH (ref 70–99)
Potassium: 3.9 mmol/L (ref 3.5–5.1)
Potassium: 4.3 mmol/L (ref 3.5–5.1)
Sodium: 161 mmol/L (ref 135–145)
Sodium: 154 mmol/L — ABNORMAL HIGH (ref 135–145)

## 2018-08-30 LAB — CBC
HCT: 30.7 % — ABNORMAL LOW (ref 36.0–46.0)
Hemoglobin: 9.6 g/dL — ABNORMAL LOW (ref 12.0–15.0)
MCH: 31.1 pg (ref 26.0–34.0)
MCHC: 31.3 g/dL (ref 30.0–36.0)
MCV: 99.4 fL (ref 80.0–100.0)
Platelets: 87 10*3/uL — ABNORMAL LOW (ref 150–400)
RBC: 3.09 MIL/uL — ABNORMAL LOW (ref 3.87–5.11)
RDW: 15.7 % — ABNORMAL HIGH (ref 11.5–15.5)
WBC: 6.3 10*3/uL (ref 4.0–10.5)
nRBC: 0.5 % — ABNORMAL HIGH (ref 0.0–0.2)

## 2018-08-30 MED ORDER — SODIUM CHLORIDE 0.9 % IV SOLN
2.0000 g | INTRAVENOUS | Status: DC
  Administered 2018-08-30 – 2018-09-01 (×3): 2 g via INTRAVENOUS
  Filled 2018-08-30 (×5): qty 2

## 2018-08-30 MED ORDER — SODIUM CHLORIDE 0.45 % IV SOLN
INTRAVENOUS | Status: DC
  Administered 2018-08-30 – 2018-08-31 (×4): via INTRAVENOUS

## 2018-08-30 MED ORDER — VANCOMYCIN HCL IN DEXTROSE 1-5 GM/200ML-% IV SOLN
1000.0000 mg | INTRAVENOUS | Status: DC
  Filled 2018-08-30: qty 200

## 2018-08-30 NOTE — Progress Notes (Signed)
Pt confused and unable to wear bipap at this time. Pt in no distress at this time.  Rt will monitor.

## 2018-08-30 NOTE — Progress Notes (Addendum)
Initial Nutrition Assessment   RD working remotely  [place]:   Not applicable  INTERVENTION:    Ensure Enlive po BID, each supplement provides 350 kcal and 20 grams of protein  NUTRITION DIAGNOSIS:   Increased nutrient needs related to acute illness as evidenced by estimated needs  GOAL:   Patient will meet greater than or equal to 90% of their needs  MONITOR:   PO intake, Supplement acceptance, Labs, Skin, Weight trends, I & O's  REASON FOR ASSESSMENT:   Consult Assessment of nutrition requirement/status  ASSESSMENT:   73 y.o. F with advanced dementia, home-dwelling and HTN who presents with decreased intake, confusion.  Admit dx include: Hypernatremia [E87.0] Hypoxia [R09.02]  RD spoke with pt's husband over phone. Pt with advanced dementia.  Husband reports pt is eating better. Ate 100% of her breakfast this morning. Drinks Ensure nutrition supplements at home.  Husband also reveals pt has had significant weight loss. Per readings below, pt has had a 10% weight loss in < 6 months. Labs & medications reviewed. Na 161 (H).  NUTRITION - FOCUSED PHYSICAL EXAM:  Unable to complete at this time  Diet Order:   Diet Order            Diet full liquid Room service appropriate? Yes; Fluid consistency: Thin  Diet effective now             EDUCATION NEEDS:   Not appropriate for education at this time  Skin:  Skin Assessment: Reviewed RN Assessment  Last BM:  4/27  Height:   Ht Readings from Last 1 Encounters:  08/30/18 5\' 5"  (1.651 m)   Weight:   Wt Readings from Last 1 Encounters:  08/30/18 63.5 kg   Wt Readings from Last 10 Encounters:  08/30/18 63.5 kg  04/07/18 63.5 kg  10/06/17 70.8 kg  01/06/17 88.5 kg  11/28/16 102.1 kg  05/25/16 114.5 kg  11/27/15 124.5 kg  07/24/15 131.5 kg  06/12/15 131.6 kg  03/12/15 129.3 kg   BMI:  Body mass index is 23.3 kg/m.  Estimated Nutritional Needs:   Kcal:  1600-1800  Protein:   75-90 gm  Fluid:  1.6-1.8 L  Tanya Jensen, RD, LDN Pager #: [phone] After-Hours Pager #: [phone]

## 2018-08-30 NOTE — Progress Notes (Signed)
Patient ID: Tanya Jensen, female   DOB: 1945-11-05, 73 y.o.   MRN: [id number]  This NP visited patient at the bedside as a follow up to  yesterday's [place].  Husband is at bedside.  Continued conversation regarding current medical situation, natural trajectory of dementia, high risk for decompensation and anticipatory care needs.  Patient's husband remains hopeful for ongoing improvement and ultimately return home.  He tells me that is strong faith is his anchor.   PT evalaution and treatment written for in hopes of increasing patient's functional status.   Discussed with husband the importance of continued conversation with  the medical providers regarding overall plan of care and treatment options,  ensuring decisions are within the context of the patients values and GOCs.  Questions and concerns addressed   Discussed with Dr Loleta Books  Total time spent on the unit was 25 minutes     PMT will continue to support holistically  Greater than 50% of the time was spent in counseling and coordination of care  Wadie Lessen NP  Palliative Medicine Team Team Phone # [phone] Pager [phone]

## 2018-08-30 NOTE — Progress Notes (Signed)
Pharmacy Antibiotic Note  Tanya Jensen is a 73 y.o. female admitted on 08/28/2018 with Sepsis and AKI.  Pharmacy has been consulted for cefepime and vancomycin dosing. Previous weight in system in December was 63.5kg, Current Scr improved to 2.61 for CrCl ~19 ml/min   Plan: Vancomycin 1000mg  IV q48h Change cefepime to 2gm IV q24h and flagyl 500mg  IV q8h Monitor levels as needed Monitor for clinical course, deescalation, and LOT      Temp (24hrs), Avg:97.6 F (36.4 C), Min:97.5 F (36.4 C), Max:97.6 F (36.4 C)  Recent Labs  Lab 08/28/18 1410 08/28/18 1836 08/29/18 0306 08/29/18 1654 08/30/18 0615  WBC 11.6*  --  10.3  --   --   CREATININE 7.99* 6.82* 5.70* 3.79* 2.61*  LATICACIDVEN 2.4*  --   --  1.9  --     CrCl cannot be calculated (Unknown ideal weight.).    No Known Allergies  Christinia Lambeth A. Levada Dy, PharmD, [place] Please utilize Amion for appropriate phone number to reach the unit pharmacist ([place])   08/30/2018 8:07 AM

## 2018-08-30 NOTE — Progress Notes (Signed)
CRITICAL VALUE ALERT  Critical Value:  Sodium 161 & chloride >130  Date & Time Notied:  08/30/18 @ 0746  Provider Notified: MD paged  Orders Received/Actions taken: will await any new orders

## 2018-08-30 NOTE — Progress Notes (Signed)
PROGRESS NOTE    SEIRA CODY  ZYS:[id number] DOB: Feb 04, 1946 DOA: 08/28/2018 PCP: Shirline Frees, MD      Brief Narrative:  Mrs. Tanya Jensen is a 73 y.o. F with advanced dementia, home-dwelling and HTN who presents with decreased intake, confusion.  In ER, found to have Cr 7.99 mg/dL, K normal, BUN 151, hypotension, Na 171 and thrombocytopenia.      Assessment & Plan:  Acute renal failure Cr 7.99 mg/dL at admission, improving well overnight with fluids.   Na and Cr suggest a chronicity of process >1 week.    -Hold ACE  -Continue IVF -Renal dose vancomycin -Avoid nephrotoxins or hypotension -Urine electrolytes and UA with microscopy ordered, nursing have not collected -Consult to Palliative Care    Dehydration Hypernatremia Free water deficit >5L at admission.  Plan to correct Na at rate no greater than 0.5 mmol/L/hr -Continue IV fluids 1/2 NS at 125 ml/hr   -Trend Na q12hrs  Hypotension Essential hypertension CXR clear.  Sepsis is not yet ruled out.  Lactate >2 initially, now normal.  Blood Cultures NGTD, UCx not obtained by nursing. -Hold antihypertensives -Continue IVF -Continue vancomycin and cefepime and Flagyl -If urine and blood cultures negative at 48 hours, plan to discontinue antibiotics  Thrombocytopenia No change ovenright -Hold Lovenox  Dementia Ambulatory, lives at home.  MMSE <10 at baseline 2 years ago  Glaucoma -Continue eye drops  Acute hypoxic respiratory failure Unclear cause.  CXR clear.  -Continue supplemental O2      MDM and disposition: The below labs and imaging reports reviewed and summarized above.  Medication management as above.  The patient was admitted with acute renal failure, acute neurogical change (at baseline is able to converse forgetfully, currently somnolent and comatose), as well as hypotension.  An extensive number of diagnoses or treatment options were managed.     Continue IV fluids, continue empiric  antibiotics.  Palliative care consulted.  Family have expectation for recovery to previous baseline.  Palliative care and I have both cautioned that she is likely to have a permanent debility from this event, and I expect hospitalist consultation at discharge would be appropriate.      DVT prophylaxis: SCDs Code Status: DO NOT RESUSCITATE Family Communication: Husband at bedside    Consultants:   Palliative Care  Procedures:   None  Antimicrobials:   Vancomycin 4/27 >>  Cefepime 4/27 >>  Flagyl 4/27 >>      Subjective: Making dark cloudy urine only.  Mentation only mumbling.    Objective: Vitals:   08/30/18 0834 08/30/18 1230 08/30/18 1423 08/30/18 1425  BP: 97/68 (!) 104/59 116/73   Pulse: 73 66 74   Resp: 15 20 17    Temp: 97.7 F (36.5 C) 97.6 F (36.4 C)    TempSrc: Axillary Axillary    SpO2: 100% 98% 96%   Weight:    63.5 kg  Height:    5\' 5"  (1.651 m)    Intake/Output Summary (Last 24 hours) at 08/30/2018 1617 Last data filed at 08/30/2018 1000 Gross per 24 hour  Intake 880 ml  Output 1000 ml  Net -120 ml   Filed Weights   08/30/18 1425  Weight: 63.5 kg    Examination: General appearance: Frail elderly female, extremely debilitated, intermittently makes eye contact HEENT: Eyes sunken, lips dry, oropharynx tacky dry, no oral lesions, no nasal deformity, discharge, or epistaxis. Skin: Dry and tented.  No rashes.   Cardiac: Tachycardic, regular, no murmurs appreciated, no lower extremity edema  Respiratory: Respiratory effort normal, no rales or wheezes. Abdomen: Abdomen soft, no focal tenderness to palpation, no rigidity or rebound. MSK: Severe diffuse loss of subcutaneous muscle mass and fat, thenar wasting, temporal wasting. Neuro: PE RRL, extraocular movements intact, strength 4/5, symmetric in upper and lower extremities bilaterally, globally weak, speech mumbling but fluent. Psych: Not oriented to self, place, situation, her husband.   Attention diminished.  Makes eye contact and loses focus.    Data Reviewed: I have personally reviewed following labs and imaging studies:  CBC: Recent Labs  Lab 08/28/18 1410 08/29/18 0306 08/30/18 0615  WBC 11.6* 10.3 6.3  HGB 12.5 11.7* 9.6*  HCT 40.0 36.2 30.7*  MCV 101.8* 98.4 99.4  PLT 103* 86* 87*   Basic Metabolic Panel: Recent Labs  Lab 08/28/18 1410 08/28/18 1836 08/29/18 0306 08/29/18 1654 08/30/18 0615  NA 173* 172* 170* 169* 161*  K 5.1 4.8 4.6 4.6 3.9  CL >130* >130* >130* >130* >130*  CO2 16* 15* 15* 20* 18*  GLUCOSE 126* 115* 166* 101* 155*  BUN 151* 140* 127* 105* 76*  CREATININE 7.99* 6.82* 5.70* 3.79* 2.61*  CALCIUM 9.9 9.0 9.1 9.1 8.6*   GFR: Estimated Creatinine Clearance: 17.8 mL/min (A) (by C-G formula based on SCr of 2.61 mg/dL (H)). Liver Function Tests: Recent Labs  Lab 08/28/18 1410  AST 84*  ALT 74*  ALKPHOS 79  BILITOT 0.7  PROT 7.3  ALBUMIN 3.3*   No results for input(s): LIPASE, AMYLASE in the last 168 hours. No results for input(s): AMMONIA in the last 168 hours. Coagulation Profile: No results for input(s): INR, PROTIME in the last 168 hours. Cardiac Enzymes: Recent Labs  Lab 08/28/18 1410  TROPONINI 0.06*   BNP (last 3 results) No results for input(s): PROBNP in the last 8760 hours. HbA1C: No results for input(s): HGBA1C in the last 72 hours. CBG: No results for input(s): GLUCAP in the last 168 hours. Lipid Profile: No results for input(s): CHOL, HDL, LDLCALC, TRIG, CHOLHDL, LDLDIRECT in the last 72 hours. Thyroid Function Tests: No results for input(s): TSH, T4TOTAL, FREET4, T3FREE, THYROIDAB in the last 72 hours. Anemia Panel: No results for input(s): VITAMINB12, FOLATE, FERRITIN, TIBC, IRON, RETICCTPCT in the last 72 hours. Urine analysis:    Component Value Date/Time   COLORURINE YELLOW 03/04/2018 0130   APPEARANCEUR CLEAR 03/04/2018 0130   LABSPEC 1.012 03/04/2018 0130   PHURINE 6.0 03/04/2018 0130    GLUCOSEU NEGATIVE 03/04/2018 0130   HGBUR SMALL (A) 03/04/2018 0130   BILIRUBINUR NEGATIVE 03/04/2018 0130   KETONESUR NEGATIVE 03/04/2018 0130   PROTEINUR NEGATIVE 03/04/2018 0130   UROBILINOGEN 1.0 01/23/2015 1656   NITRITE POSITIVE (A) 03/04/2018 0130   LEUKOCYTESUR TRACE (A) 03/04/2018 0130   Sepsis Labs: @LABRCNTIP (procalcitonin:4,lacticacidven:4)  ) Recent Results (from the past 240 hour(s))  Culture, blood (routine x 2)     Status: None (Preliminary result)   Collection Time: 08/29/18  4:50 PM  Result Value Ref Range Status   Specimen Description BLOOD RIGHT ANTECUBITAL  Final   Special Requests   Final    BOTTLES DRAWN AEROBIC ONLY Blood Culture adequate volume   Culture   Final    NO GROWTH < 24 HOURS Performed at [facility], [place] [address] [name], [place] [address]    Report Status PENDING  Incomplete  Culture, blood (routine x 2)     Status: None (Preliminary result)   Collection Time: 08/29/18  4:54 PM  Result Value Ref Range Status  Specimen Description BLOOD LEFT HAND  Final   Special Requests   Final    BOTTLES DRAWN AEROBIC ONLY Blood Culture results may not be optimal due to an inadequate volume of blood received in culture bottles   Culture   Final    NO GROWTH < 24 HOURS Performed at [facility] [address], [place], [place] [address]    Report Status PENDING  Incomplete         Radiology Studies: No results found.      Scheduled Meds: . atropine  1 drop Both Eyes Daily  . docusate sodium  100 mg Oral BID  . dorzolamide-timolol  1 drop Both Eyes BID  . fluorometholone  1 drop Both Eyes BID  . ketorolac  1 drop Both Eyes BID  . latanoprost  1 drop Both Eyes QHS  . sodium chloride flush  3 mL Intravenous Q12H   Continuous Infusions: . sodium chloride 125 mL/hr at 08/30/18 1226  . ceFEPime (MAXIPIME) IV    . metronidazole 500 mg (08/30/18 1343)  . [START ON 08/31/2018] vancomycin       LOS: 2 days    Time spent:  35 minutes     Edwin Dada, MD [facility] 08/30/2018, 4:17 PM     Please page through [place]:  [email or web address] Password [email or web address] If 7PM-7AM, please contact night-coverage

## 2018-08-31 DIAGNOSIS — G3183 Dementia with Lewy bodies: Secondary | ICD-10-CM

## 2018-08-31 LAB — BASIC METABOLIC PANEL
Anion gap: 6 (ref 5–15)
BUN: 46 mg/dL — ABNORMAL HIGH (ref 8–23)
CO2: 14 mmol/L — ABNORMAL LOW (ref 22–32)
Calcium: 8.5 mg/dL — ABNORMAL LOW (ref 8.9–10.3)
Chloride: 129 mmol/L — ABNORMAL HIGH (ref 98–111)
Creatinine, Ser: 1.54 mg/dL — ABNORMAL HIGH (ref 0.44–1.00)
GFR calc Af Amer: 39 mL/min — ABNORMAL LOW (ref >60)
GFR calc non Af Amer: 34 mL/min — ABNORMAL LOW (ref >60)
Glucose, Bld: 93 mg/dL (ref 70–99)
Potassium: 3.5 mmol/L (ref 3.5–5.1)
Sodium: 149 mmol/L — ABNORMAL HIGH (ref 135–145)

## 2018-08-31 LAB — URINALYSIS, ROUTINE W REFLEX MICROSCOPIC
Bilirubin Urine: NEGATIVE
Glucose, UA: NEGATIVE mg/dL
Ketones, ur: NEGATIVE mg/dL
Leukocytes,Ua: NEGATIVE
Nitrite: NEGATIVE
Protein, ur: NEGATIVE mg/dL
Specific Gravity, Urine: 1.009 (ref 1.005–1.030)
pH: 5 (ref 5.0–8.0)

## 2018-08-31 LAB — CBC
HCT: 27.8 % — ABNORMAL LOW (ref 36.0–46.0)
Hemoglobin: 9.4 g/dL — ABNORMAL LOW (ref 12.0–15.0)
MCH: 31.9 pg (ref 26.0–34.0)
MCHC: 33.8 g/dL (ref 30.0–36.0)
MCV: 94.2 fL (ref 80.0–100.0)
Platelets: 67 10*3/uL — ABNORMAL LOW (ref 150–400)
RBC: 2.95 MIL/uL — ABNORMAL LOW (ref 3.87–5.11)
RDW: 14.9 % (ref 11.5–15.5)
WBC: 6.5 10*3/uL (ref 4.0–10.5)
nRBC: 0.5 % — ABNORMAL HIGH (ref 0.0–0.2)

## 2018-08-31 LAB — OSMOLALITY, URINE: Osmolality, Ur: 337 mOsm/kg (ref 300–900)

## 2018-08-31 MED ORDER — VANCOMYCIN HCL IN DEXTROSE 750-5 MG/150ML-% IV SOLN
750.0000 mg | INTRAVENOUS | Status: DC
  Administered 2018-08-31: 750 mg via INTRAVENOUS
  Filled 2018-08-31: qty 150

## 2018-08-31 MED ORDER — SODIUM CHLORIDE 0.45 % IV SOLN: INTRAVENOUS | Status: AC

## 2018-08-31 NOTE — Progress Notes (Signed)
PROGRESS NOTE  Tanya Jensen XHB:[id number] DOB: 10-18-1945 DOA: 08/28/2018 PCP: Shirline Frees, MD   LOS: 3 days   Brief Narrative / Interim history: Tanya Jensen is a 73 y.o. F with advanced dementia, home-dwelling and HTN who presents with decreased intake, confusion. In ER, found to have Cr 7.99 mg/dL, K normal, BUN 151, hypotension, Na 171 and thrombocytopenia.  Subjective: -Underlying dementia, no complaints, alert, pleasant  Assessment & Plan: Principal Problem:   Dehydration with hypernatremia Active Problems:   Lewy body dementia with behavioral disturbance (HCC)   Essential hypertension   Open-angle glaucoma, severe stage   Acute renal failure (ARF) (HCC)   Palliative care by specialist   Principal Problem Acute renal failure  -Creatinine around 8 on admission, improving ~1.5 today with IV fluids -Husband is at bedside tells me that she has had very poor p.o. intake over the last several days.  At baseline she is eating well and drinking without difficulties  Active Problems Acute metabolic encephalopathy superimposed on top of advanced dementia /Sirs on admission -Her dementia is chronic.  She was slightly worse with decreased p.o. intake in the last several days.  Patient had no complaints however husband noticed that she was having abdominal discomfort which may be due to constipation seen on the CT scan versus possible UTI.  She did not get a urinalysis no urine cultures on admission, likely because of incontinence but has history of UTIs in the past.  Not unreasonable to think that she may have had a UTI which caused her to have poor p.o. intake resulting in renal failure.  It is reasonable to continue antibiotics, no need for vancomycin which will be discontinued.  Hypernatremia -Likely in the setting of dehydration, free water deficit.  Sodium 149, improving  Hypertension -Hold home medications due to hypotension on admission.  Blood pressure currently normal.   On IV fluids.  Thrombocytopenia -Possibly related to underlying infectious process, or may be dilutional given all lines went down  Acute hypoxic respiratory failure -Chest x-ray clear, she is on room air this morning  Constipation -Received enema, now resolved, had at least 5 bowel movements in the last couple of days  Scheduled Meds:  atropine  1 drop Both Eyes Daily   docusate sodium  100 mg Oral BID   dorzolamide-timolol  1 drop Both Eyes BID   fluorometholone  1 drop Both Eyes BID   ketorolac  1 drop Both Eyes BID   latanoprost  1 drop Both Eyes QHS   sodium chloride flush  3 mL Intravenous Q12H   Continuous Infusions:  sodium chloride 125 mL/hr at 08/31/18 1030   ceFEPime (MAXIPIME) IV 2 g (08/30/18 1627)   metronidazole 500 mg (08/31/18 0606)   vancomycin 750 mg (08/31/18 1138)   PRN Meds:.acetaminophen **OR** acetaminophen, bisacodyl, ondansetron **OR** ondansetron (ZOFRAN) IV, polyethylene glycol, traZODone  DVT prophylaxis: SCDs Code Status: DNR Family Communication: husband at bedside  Disposition Plan: home when ready   Consultants:   None   Procedures:   None   Antimicrobials:  Cefepime / Metronidazole 4/28   Objective: Vitals:   08/30/18 1425 08/30/18 2215 08/31/18 0100 08/31/18 0549  BP:  107/70  100/72  Pulse:  75  60  Resp:  16  16  Temp:  (!) 97.5 F (36.4 C)  (!) 97.4 F (36.3 C)  TempSrc:  Oral  Oral  SpO2:  (!) 87%  100%  Weight: 63.5 kg  61.2 kg   Height: 5\' 5"  (1.651  m)       Intake/Output Summary (Last 24 hours) at 08/31/2018 1259 Last data filed at 08/31/2018 1100 Gross per 24 hour  Intake 3152.94 ml  Output 1200 ml  Net 1952.94 ml   Filed Weights   08/30/18 1425 08/31/18 0100  Weight: 63.5 kg 61.2 kg    Examination:  Constitutional: NAD Eyes: PERRL, lids and conjunctivae normal ENMT: Mucous membranes are dry  Neck: normal, supple Respiratory: clear to auscultation bilaterally, no wheezing, no crackles.  Normal respiratory effort.  Cardiovascular: Regular rate and rhythm, no murmurs / rubs / gallops. No LE edema. 2+ pedal pulses.  Abdomen: no tenderness. Bowel sounds positive.  Musculoskeletal: no clubbing / cyanosis. Neurologic: CN 2-12 grossly intact. Strength 5/5 in all 4.  Psychiatric: Normal judgment and insight. Alert and oriented x 3. Normal mood.    Data Reviewed: I have independently reviewed following labs and imaging studies   CBC: Recent Labs  Lab 08/28/18 1410 08/29/18 0306 08/30/18 0615 08/31/18 0308  WBC 11.6* 10.3 6.3 6.5  HGB 12.5 11.7* 9.6* 9.4*  HCT 40.0 36.2 30.7* 27.8*  MCV 101.8* 98.4 99.4 94.2  PLT 103* 86* 87* 67*   Basic Metabolic Panel: Recent Labs  Lab 08/29/18 0306 08/29/18 1654 08/30/18 0615 08/30/18 1626 08/31/18 0308  NA 170* 169* 161* 154* 149*  K 4.6 4.6 3.9 4.3 3.5  CL >130* >130* >130* >130* 129*  CO2 15* 20* 18* 13* 14*  GLUCOSE 166* 101* 155* 122* 93  BUN 127* 105* 76* 61* 46*  CREATININE 5.70* 3.79* 2.61* 1.96* 1.54*  CALCIUM 9.1 9.1 8.6* 8.7* 8.5*   GFR: Estimated Creatinine Clearance: 30.2 mL/min (A) (by C-G formula based on SCr of 1.54 mg/dL (H)). Liver Function Tests: Recent Labs  Lab 08/28/18 1410  AST 84*  ALT 74*  ALKPHOS 79  BILITOT 0.7  PROT 7.3  ALBUMIN 3.3*   No results for input(s): LIPASE, AMYLASE in the last 168 hours. No results for input(s): AMMONIA in the last 168 hours. Coagulation Profile: No results for input(s): INR, PROTIME in the last 168 hours. Cardiac Enzymes: Recent Labs  Lab 08/28/18 1410  TROPONINI 0.06*   BNP (last 3 results) No results for input(s): PROBNP in the last 8760 hours. HbA1C: No results for input(s): HGBA1C in the last 72 hours. CBG: No results for input(s): GLUCAP in the last 168 hours. Lipid Profile: No results for input(s): CHOL, HDL, LDLCALC, TRIG, CHOLHDL, LDLDIRECT in the last 72 hours. Thyroid Function Tests: No results for input(s): TSH, T4TOTAL, FREET4,  T3FREE, THYROIDAB in the last 72 hours. Anemia Panel: No results for input(s): VITAMINB12, FOLATE, FERRITIN, TIBC, IRON, RETICCTPCT in the last 72 hours. Urine analysis:    Component Value Date/Time   COLORURINE YELLOW 03/04/2018 0130   APPEARANCEUR CLEAR 03/04/2018 0130   LABSPEC 1.012 03/04/2018 0130   PHURINE 6.0 03/04/2018 0130   GLUCOSEU NEGATIVE 03/04/2018 0130   HGBUR SMALL (A) 03/04/2018 0130   BILIRUBINUR NEGATIVE 03/04/2018 0130   KETONESUR NEGATIVE 03/04/2018 0130   PROTEINUR NEGATIVE 03/04/2018 0130   UROBILINOGEN 1.0 01/23/2015 1656   NITRITE POSITIVE (A) 03/04/2018 0130   LEUKOCYTESUR TRACE (A) 03/04/2018 0130   Sepsis Labs: Invalid input(s): PROCALCITONIN, LACTICIDVEN  Recent Results (from the past 240 hour(s))  Culture, blood (routine x 2)     Status: None (Preliminary result)   Collection Time: 08/29/18  4:50 PM  Result Value Ref Range Status   Specimen Description BLOOD RIGHT ANTECUBITAL  Final   Special Requests  Final    BOTTLES DRAWN AEROBIC ONLY Blood Culture adequate volume   Culture   Final    NO GROWTH < 24 HOURS Performed at [facility], [address] [place], [place] [address]    Report Status PENDING  Incomplete  Culture, blood (routine x 2)     Status: None (Preliminary result)   Collection Time: 08/29/18  4:54 PM  Result Value Ref Range Status   Specimen Description BLOOD LEFT HAND  Final   Special Requests   Final    BOTTLES DRAWN AEROBIC ONLY Blood Culture results may not be optimal due to an inadequate volume of blood received in culture bottles   Culture   Final    NO GROWTH < 24 HOURS Performed at [facility], [place] [address] [place], [place] [address]    Report Status PENDING  Incomplete      Radiology Studies: No results found.   Marzetta Board, MD, PhD [facility]  Contact via  [email or web address]  [place] P: [phone]  F: [phone]

## 2018-08-31 NOTE — Evaluation (Signed)
Physical Therapy Evaluation Patient Details Name: Tanya Jensen MRN: [id number] DOB: 18-Nov-1945 Today's Date: 08/31/2018   History of Present Illness  Pt is a 73 y/o female admitted secondary to increased confusion. Found to have fecal impaction with possible proctitis, dehydration with hypernatremia, and ARF. PMH includes dementia and HTN.   Clinical Impression  Pt admitted secondary to problem above with deficits below. Pt with dementia at baseline; husband present throughout session to assist with answering questions and to assist with mobility. Pt initially very resistive to mobility tasks, however, with husbands encouragement, became more agreeable. Required min to mod A +2 for mobility tasks within the room. Per pt's husband, he plans to take pt home. Plan to trial use of RW next session, however, pt may not use and may prefer HHA secondary to cognitive deficits. Will continue to follow acutely to maximize functional mobility independence and safety.     Follow Up Recommendations Home health PT;Supervision/Assistance - 24 hour    Equipment Recommendations  3in1 (PT);Rolling walker with 5" wheels    Recommendations for Other Services       Precautions / Restrictions Precautions Precautions: Fall;Other (comment) Precaution Comments: Rectal pouch Restrictions Weight Bearing Restrictions: No      Mobility  Bed Mobility Overal bed mobility: Needs Assistance Bed Mobility: Supine to Sit;Sit to Supine     Supine to sit: Total assist;+2 for physical assistance Sit to supine: Max assist;+2 for physical assistance   General bed mobility comments: Total A +2 to come to sitting. Pt initially very resistive, however, husband able to calm pt. Pt was able to maintain sitting balance with min guard to supervision after she realized she was not going to fall. Pt requiring max A +2 to return to supine. Positioned with pillow under L hip to decrease pressure.   Transfers Overall transfer  level: Needs assistance Equipment used: 2 person hand held assist Transfers: Sit to/from Stand Sit to Stand: Mod assist;Min assist;+2 physical assistance         General transfer comment: Initially required mod A +2 to stand, however, as pt increased comfort required min A +2 on 2nd and 3rd attempts to stand.   Ambulation/Gait Ambulation/Gait assistance: Mod assist;Min assist;+2 physical assistance Gait Distance (Feet): 25 Feet(10', 15') Assistive device: 2 person hand held assist Gait Pattern/deviations: Step-through pattern;Decreased stride length;Shuffle Gait velocity: Decreased    General Gait Details: Performed 2 trials of gait training this session. PT on one side and pt's husband on other side. Initially requiring mod A +2 for steadying, however, as pt increased comfort with ambulation, progressed to requiring min A +2 for steadying. Required multimodal cues for sequencing and directional cues as pt with visual difficulties.   Stairs            Wheelchair Mobility    Modified Rankin (Stroke Patients Only)       Balance Overall balance assessment: Needs assistance Sitting-balance support: Feet supported;Bilateral upper extremity supported Sitting balance-Leahy Scale: Poor Sitting balance - Comments: Reliant on BUE support    Standing balance support: Bilateral upper extremity supported;During functional activity Standing balance-Leahy Scale: Poor Standing balance comment: Reliant on BUE and external support                             Pertinent Vitals/Pain Pain Assessment: Faces Faces Pain Scale: Hurts little more Pain Location: generalized Pain Descriptors / Indicators: Grimacing;Guarding Pain Intervention(s): Limited activity within patient's tolerance;Monitored during session;Repositioned  Home Living Family/patient expects to be discharged to:: Private residence Living Arrangements: Spouse/significant other Available Help at Discharge:  Family;Available 24 hours/day Type of Home: House Home Access: Stairs to enter Entrance Stairs-Rails: Right Entrance Stairs-Number of Steps: 2 Home Layout: One level Home Equipment: None Additional Comments: Information from husband who was present during session.     Prior Function Level of Independence: Needs assistance   Gait / Transfers Assistance Needed: Husband assists pt with ambulation using hand in hand technique.   ADL's / Homemaking Assistance Needed: Husband assists pt with getting into and out of tub. Supervision provided while bathing.         Hand Dominance        Extremity/Trunk Assessment   Upper Extremity Assessment Upper Extremity Assessment: Defer to OT evaluation    Lower Extremity Assessment Lower Extremity Assessment: Generalized weakness;LLE deficits/detail LLE Deficits / Details: Noted red area over R hip. RN notified and applied dressing to area.     Cervical / Trunk Assessment Cervical / Trunk Assessment: Normal  Communication   Communication: No difficulties  Cognition Arousal/Alertness: Awake/alert Behavior During Therapy: Restless Overall Cognitive Status: History of cognitive impairments - at baseline                                 General Comments: Pt with dementia at baseline       General Comments General comments (skin integrity, edema, etc.): Pt's husband in room throughout session and very supportive.     Exercises     Assessment/Plan    PT Assessment Patient needs continued PT services  PT Problem List Decreased strength;Decreased balance;Decreased mobility;Decreased cognition;Decreased knowledge of use of DME;Decreased safety awareness;Decreased knowledge of precautions       PT Treatment Interventions DME instruction;Gait training;Functional mobility training;Stair training;Therapeutic activities;Therapeutic exercise;Patient/family education;Cognitive remediation;Balance training    PT Goals (Current  goals can be found in the Care Plan section)  Acute Rehab PT Goals Patient Stated Goal: for pt to return home per pt's husband PT Goal Formulation: With family Time For Goal Achievement: 09/14/18 Potential to Achieve Goals: Fair    Frequency Min 3X/week   Barriers to discharge        Co-evaluation               AM-PAC PT "6 Clicks" Mobility  Outcome Measure Help needed turning from your back to your side while in a flat bed without using bedrails?: Total Help needed moving from lying on your back to sitting on the side of a flat bed without using bedrails?: Total Help needed moving to and from a bed to a chair (including a wheelchair)?: A Lot Help needed standing up from a chair using your arms (e.g., wheelchair or bedside chair)?: A Little Help needed to walk in hospital room?: A Little Help needed climbing 3-5 steps with a railing? : A Lot 6 Click Score: 12    End of Session Equipment Utilized During Treatment: Gait belt Activity Tolerance: Patient tolerated treatment well Patient left: in bed;with call bell/phone within reach;with bed alarm set;with family/visitor present Nurse Communication: Mobility status PT Visit Diagnosis: Unsteadiness on feet (R26.81);Muscle weakness (generalized) (M62.81);Other symptoms and signs involving the nervous system (R29.898);Difficulty in walking, not elsewhere classified (R26.2)    Time: 8144-8185 PT Time Calculation (min) (ACUTE ONLY): 39 min   Charges:   PT Evaluation $PT Eval Moderate Complexity: 1 Mod PT Treatments $Therapeutic Activity: 23-37 mins  Leighton Ruff, PT, DPT  Acute Rehabilitation Services  Pager: [phone] Office: [phone]   Rudean Hitt 08/31/2018, 5:16 PM

## 2018-08-31 NOTE — Progress Notes (Signed)
Patient BP 80/52.  Notified Tylene Fantasia, NP.  Will continue to monitor the patient

## 2018-08-31 NOTE — Progress Notes (Signed)
PHARMACY NOTE:  ANTIMICROBIAL RENAL DOSAGE ADJUSTMENT  Current antimicrobial regimen includes a mismatch between antimicrobial dosage and estimated renal function.  As per policy approved by the Pharmacy & Therapeutics and Medical Executive Committees, the antimicrobial dosage will be adjusted accordingly.  Current antimicrobial dosage:  Vancomycin 1000mg  IV q48h  Indication: empiric sepsis  Renal Function:  Estimated Creatinine Clearance: 30.2 mL/min (A) (by C-G formula based on SCr of 1.54 mg/dL (H)). []      On intermittent HD, scheduled: []      On CRRT    Antimicrobial dosage has been changed to:  Vancomycin 750mg  Iv q24h  Additional comments: renal function has improved significantly since admission. Will increase frequency.    Kennis Buell A. Levada Dy, PharmD, [place] Please utilize Amion for appropriate phone number to reach the unit pharmacist ([place])   08/31/2018 9:53 AM

## 2018-08-31 NOTE — Progress Notes (Addendum)
SLP Cancellation Note  Patient Details Name: Tanya Jensen MRN: [id number] DOB: [birth year]   Cancelled treatment:       Reason Eval/Treat Not Completed: Fatigue/lethargy limiting ability to participate. Pt sleeping with family at bedside who reported she just ate breakfast without difficulty. He also reports he has no concerns about her swallowing and she typically eats a regular texture diet. Will discuss with MD and f/u as needed.  Addendum: followed up with MD who will advance diet. Will defer eval. If concerns arise please reorder.   Herbie Baltimore, MA CCC-SLP  Acute Rehabilitation Services Pager [phone] Office [phone]  Lynann Beaver 08/31/2018, 12:08 PM

## 2018-09-01 DIAGNOSIS — R627 Adult failure to thrive: Secondary | ICD-10-CM

## 2018-09-01 LAB — CBC
HCT: 30.6 % — ABNORMAL LOW (ref 36.0–46.0)
Hemoglobin: 10.1 g/dL — ABNORMAL LOW (ref 12.0–15.0)
MCH: 31.5 pg (ref 26.0–34.0)
MCHC: 33 g/dL (ref 30.0–36.0)
MCV: 95.3 fL (ref 80.0–100.0)
Platelets: 87 10*3/uL — ABNORMAL LOW (ref 150–400)
RBC: 3.21 MIL/uL — ABNORMAL LOW (ref 3.87–5.11)
RDW: 14.6 % (ref 11.5–15.5)
WBC: 6.9 10*3/uL (ref 4.0–10.5)
nRBC: 0 % (ref 0.0–0.2)

## 2018-09-01 LAB — URINE CULTURE: Culture: NO GROWTH

## 2018-09-01 LAB — BASIC METABOLIC PANEL
Anion gap: 7 (ref 5–15)
BUN: 27 mg/dL — ABNORMAL HIGH (ref 8–23)
CO2: 14 mmol/L — ABNORMAL LOW (ref 22–32)
Calcium: 8.3 mg/dL — ABNORMAL LOW (ref 8.9–10.3)
Chloride: 125 mmol/L — ABNORMAL HIGH (ref 98–111)
Creatinine, Ser: 1.16 mg/dL — ABNORMAL HIGH (ref 0.44–1.00)
GFR calc Af Amer: 55 mL/min — ABNORMAL LOW (ref >60)
GFR calc non Af Amer: 47 mL/min — ABNORMAL LOW (ref >60)
Glucose, Bld: 74 mg/dL (ref 70–99)
Potassium: 3.7 mmol/L (ref 3.5–5.1)
Sodium: 146 mmol/L — ABNORMAL HIGH (ref 135–145)

## 2018-09-01 MED ORDER — METRONIDAZOLE 500 MG PO TABS
500.0000 mg | ORAL_TABLET | Freq: Three times a day (TID) | ORAL | Status: DC
  Administered 2018-09-01 – 2018-09-02 (×3): 500 mg via ORAL
  Filled 2018-09-01 (×3): qty 1

## 2018-09-01 MED ORDER — SODIUM CHLORIDE 0.9 % IV SOLN
INTRAVENOUS | Status: DC
  Administered 2018-09-01 – 2018-09-02 (×2): via INTRAVENOUS

## 2018-09-01 NOTE — Evaluation (Signed)
Occupational Therapy Evaluation Patient Details Name: Tanya Jensen MRN: [id number] DOB: 09/13/1945 Today's Date: 09/01/2018    History of Present Illness Pt is a 73 y/o female admitted secondary to increased confusion. Found to have fecal impaction with possible proctitis, dehydration with hypernatremia, and ARF. PMH includes dementia and HTN.    Clinical Impression   Pt PTA: living with spouse and he is a wonderful caregiver and continues to assist with her ADL and mobility. Pt currently with maxA+2 for bed mobility; transfers with maxA+2 and ADL functional mobility with modA+2 with hand held assist. Pt stood at sink for grooming with maximal cueing and leaning on spouse and modA +2 overall. Pt would greatly benefit from continued OT skilled services for ADL, mobility and safety in [facility] setting. OT to follow acutely.      Follow Up Recommendations  Home health OT;Supervision/Assistance - 24 hour    Equipment Recommendations  None recommended by OT    Recommendations for Other Services       Precautions / Restrictions Precautions Precautions: Fall;Other (comment) Precaution Comments: Rectal pouch Restrictions Weight Bearing Restrictions: No      Mobility Bed Mobility Overal bed mobility: Needs Assistance Bed Mobility: Supine to Sit;Sit to Supine     Supine to sit: Max assist;+2 for physical assistance     General bed mobility comments: Total A +2 to come to sitting. Pt initially very resistive, however, husband able to calm pt. Pt was able to maintain sitting balance with min guard to supervision after she realized she was not going to fall. Pt requiring max A +2 to return to supine. Positioned with pillow under L hip to decrease pressure.   Transfers Overall transfer level: Needs assistance Equipment used: 2 person hand held assist Transfers: Sit to/from Stand Sit to Stand: Mod assist;Min assist;+2 physical assistance         General transfer comment: Initially  required mod A +2 to stand, however, as pt increased comfort required min A +2 on 2nd and 3rd attempts to stand.     Balance Overall balance assessment: Needs assistance Sitting-balance support: Feet supported;Bilateral upper extremity supported Sitting balance-Leahy Scale: Poor Sitting balance - Comments: Reliant on BUE support    Standing balance support: Bilateral upper extremity supported;During functional activity Standing balance-Leahy Scale: Poor Standing balance comment: Reliant on BUE and external support                           ADL either performed or assessed with clinical judgement   ADL Overall ADL's : Needs assistance/impaired Eating/Feeding: Moderate assistance;Sitting   Grooming: Moderate assistance;Sitting Grooming Details (indicate cue type and reason): stood at sink for hand washing, but max verbal cues. Upper Body Bathing: Moderate assistance;Sitting   Lower Body Bathing: Maximal assistance;Sitting/lateral leans;Sit to/from stand   Upper Body Dressing : Moderate assistance;Sitting;Standing   Lower Body Dressing: Maximal assistance;Sitting/lateral leans;Sit to/from stand   Toilet Transfer: Theatre stage manager and Hygiene: Maximal assistance;Cueing for safety;Cueing for sequencing;Sitting/lateral lean;Sit to/from stand       Functional mobility during ADLs: Moderate assistance;+2 for physical assistance;+2 for safety/equipment(hand held assist) General ADL Comments: Pt requiring additional assist for UB ADL and LB ADL, but pt with dementia and worsening in new setting.     Vision Baseline Vision/History: No visual deficits Patient Visual Report: No change from baseline Vision Assessment?: No apparent visual deficits     Perception     Praxis  Pertinent Vitals/Pain Pain Assessment: Faces Faces Pain Scale: Hurts a little bit Pain Location: generalized Pain Descriptors / Indicators:  Grimacing;Guarding Pain Intervention(s): Limited activity within patient's tolerance     Hand Dominance     Extremity/Trunk Assessment Upper Extremity Assessment Upper Extremity Assessment: Generalized weakness   Lower Extremity Assessment Lower Extremity Assessment: Generalized weakness LLE Deficits / Details: Noted red area over R hip. RN notified and applied dressing to area.    Cervical / Trunk Assessment Cervical / Trunk Assessment: Normal   Communication Communication Communication: No difficulties   Cognition Arousal/Alertness: Awake/alert Behavior During Therapy: Restless Overall Cognitive Status: History of cognitive impairments - at baseline                                 General Comments: Pt with dementia at baseline; following 50% of commands   General Comments  Pt's husband in room throughout session and very supportive.     Exercises     Shoulder Instructions      Home Living Family/patient expects to be discharged to:: Private residence Living Arrangements: Spouse/significant other Available Help at Discharge: Family;Available 24 hours/day Type of Home: House Home Access: Stairs to enter [employer] of Steps: 2 Entrance Stairs-Rails: Right Home Layout: One level     Bathroom Shower/Tub: Teacher, early years/pre: Handicapped height     Home Equipment: None   Additional Comments: Information from husband who was present during session.       Prior Functioning/Environment Level of Independence: Needs assistance  Gait / Transfers Assistance Needed: Husband assists pt with ambulation using hand in hand technique.  ADL's / Homemaking Assistance Needed: Husband assists pt with getting into and out of tub. Supervision provided while bathing.             OT Problem List: Decreased strength;Decreased activity tolerance;Impaired balance (sitting and/or standing);Decreased coordination;Decreased safety  awareness;Pain      OT Treatment/Interventions: Self-care/ADL training;Therapeutic exercise;Neuromuscular education;Energy conservation;Therapeutic activities;Patient/family education;Balance training    OT Goals(Current goals can be found in the care plan section) Acute Rehab OT Goals Patient Stated Goal: for pt to return home per pt's husband OT Goal Formulation: With patient Time For Goal Achievement: 09/15/18 Potential to Achieve Goals: Good ADL Goals Pt Will Perform Grooming: with min guard assist;standing Pt Will Transfer to Toilet: with min guard assist;ambulating Additional ADL Goal #1: Pt will tolerate sitting at edge of bed for ADL tasks x5 mins with fair balance. Additional ADL Goal #2: Pt will sit to stand with minguardA for ADL tasks.  OT Frequency: Min 2X/week   Barriers to D/C:            Co-evaluation              AM-PAC OT "6 Clicks" Daily Activity     Outcome Measure Help from another person eating meals?: A Lot Help from another person taking care of personal grooming?: A Lot Help from another person toileting, which includes using toliet, bedpan, or urinal?: Total Help from another person bathing (including washing, rinsing, drying)?: Total Help from another person to put on and taking off regular upper body clothing?: A Lot Help from another person to put on and taking off regular lower body clothing?: Total 6 Click Score: 9   End of Session Equipment Utilized During Treatment: Gait belt Nurse Communication: Mobility status  Activity Tolerance: Patient tolerated treatment well Patient left: in chair;with call  bell/phone within reach;with family/visitor present  OT Visit Diagnosis: Unsteadiness on feet (R26.81);Muscle weakness (generalized) (M62.81)                Time: 1100-3496 OT Time Calculation (min): 25 min Charges:  OT General Charges $OT Visit: 1 Visit OT Evaluation $OT Eval Moderate Complexity: 1 Mod OT Treatments $Self Care/Home  Management : 8-22 mins  Ebony Hail Harold Hedge) Marsa Aris OTR/L Acute Rehabilitation Services Pager: [phone] Office: [place] 09/01/2018, 4:16 PM

## 2018-09-01 NOTE — Progress Notes (Signed)
OT Cancellation Note  Patient Details Name: Tanya Jensen MRN: [id number] DOB: Dec 20, 1945   Cancelled Treatment:    Reason Eval/Treat Not Completed: Patient declined, no reason specified(Pt's spouse wanting to defer OT this AM as pt finally asleep)   Darryl Nestle) Marsa Aris OTR/L Acute Rehabilitation Services Pager: [phone] Office: [place] 09/01/2018, 10:16 AM

## 2018-09-01 NOTE — Progress Notes (Signed)
PROGRESS NOTE  Tanya Jensen ZOX:[id number] DOB: 1945-07-11 DOA: 08/28/2018 PCP: Shirline Frees, MD   LOS: 4 days   Brief Narrative / Interim history: Tanya Jensen is a 73 y.o. F with advanced dementia, home-dwelling and HTN who presents with decreased intake, confusion. In ER, found to have Cr 7.99 mg/dL, K normal, BUN 151, hypotension, Na 171 and thrombocytopenia.  Subjective: -No specific complaints, underlying dementia   Assessment & Plan: Principal Problem:   Dehydration with hypernatremia Active Problems:   Lewy body dementia with behavioral disturbance (HCC)   Essential hypertension   Open-angle glaucoma, severe stage   Acute renal failure (ARF) (HCC)   Palliative care by specialist   Principal Problem Acute renal failure  -Creatinine around 8 on admission -Husband is at bedside tells me that she has had very poor p.o. intake over the last several days.  At baseline she is eating well and drinking without difficulties -With IV fluids creatinine has improved significantly, 1.16 this morning, almost resolved  Active Problems Acute metabolic encephalopathy superimposed on top of advanced dementia /Sirs on admission -Her dementia is chronic.  She was slightly worse with decreased p.o. intake in the last several days.  Patient had no complaints however husband noticed that she was having abdominal discomfort which may be due to constipation seen on the CT scan versus possible UTI.  She did not get a urinalysis no urine cultures on admission, likely because of incontinence but has history of UTIs in the past.  Not unreasonable to think that she may have had a UTI which caused her to have poor p.o. intake resulting in renal failure.  It is reasonable to continue antibiotics, no need for vancomycin which will be discontinued. -We will transition to oral agents on discharge  Hypernatremia -Likely in the setting of dehydration, free water deficit.  Sodium 146, improving   Hypertension -Hold home medications due to hypotension on admission.  Blood pressure currently normal.  Continue IV fluids  Thrombocytopenia -Possibly related to underlying infectious process, or may be dilutional given all lines went down.  Platelets improving today  Acute hypoxic respiratory failure -Chest x-ray clear, she is on room air this morning  Constipation -Received enema, now resolved, had at least 5 bowel movements in the last couple of days  Scheduled Meds: . atropine  1 drop Both Eyes Daily  . docusate sodium  100 mg Oral BID  . dorzolamide-timolol  1 drop Both Eyes BID  . fluorometholone  1 drop Both Eyes BID  . ketorolac  1 drop Both Eyes BID  . latanoprost  1 drop Both Eyes QHS  . metroNIDAZOLE  500 mg Oral Q8H  . sodium chloride flush  3 mL Intravenous Q12H   Continuous Infusions: . sodium chloride 100 mL/hr at 09/01/18 0937  . ceFEPime (MAXIPIME) IV 2 g (08/31/18 1359)   PRN Meds:.acetaminophen **OR** acetaminophen, bisacodyl, ondansetron **OR** ondansetron (ZOFRAN) IV, polyethylene glycol, traZODone  DVT prophylaxis: SCDs Code Status: DNR Family Communication: husband at bedside  Disposition Plan: home when ready   Consultants:   None   Procedures:   None   Antimicrobials:  Cefepime / Metronidazole 4/28   Objective: Vitals:   08/31/18 2206 09/01/18 0054 09/01/18 0500 09/01/18 0550  BP: (!) 80/52 103/67  (!) 99/55  Pulse: 74   (!) 55  Resp: 18   18  Temp: (!) 97.5 F (36.4 C)   98.5 F (36.9 C)  TempSrc: Axillary   Axillary  SpO2: 100%   100%  Weight:   61 kg   Height:        Intake/Output Summary (Last 24 hours) at 09/01/2018 1251 Last data filed at 09/01/2018 1143 Gross per 24 hour  Intake 2946.75 ml  Output 700 ml  Net 2246.75 ml   Filed Weights   08/30/18 1425 08/31/18 0100 09/01/18 0500  Weight: 63.5 kg 61.2 kg 61 kg    Examination:  Constitutional: No distress, pleasantly confused Eyes: No scleral icterus ENMT: Moist  mucous membranes Neck: normal, supple Respiratory: Clear bilaterally, no wheezing or crackles Cardiovascular: Regular rate and rhythm, no murmurs appreciated.  No peripheral edema Abdomen: Soft, nontender, nondistended, positive bowel sounds Musculoskeletal: no clubbing / cyanosis. Neurologic: No focal deficits, equal strength Psychiatric: Normal judgment and insight. Alert and oriented x 3. Normal mood.    Data Reviewed: I have independently reviewed following labs and imaging studies   CBC: Recent Labs  Lab 08/28/18 1410 08/29/18 0306 08/30/18 0615 08/31/18 0308 09/01/18 0613  WBC 11.6* 10.3 6.3 6.5 6.9  HGB 12.5 11.7* 9.6* 9.4* 10.1*  HCT 40.0 36.2 30.7* 27.8* 30.6*  MCV 101.8* 98.4 99.4 94.2 95.3  PLT 103* 86* 87* 67* 87*   Basic Metabolic Panel: Recent Labs  Lab 08/29/18 1654 08/30/18 0615 08/30/18 1626 08/31/18 0308 09/01/18 0613  NA 169* 161* 154* 149* 146*  K 4.6 3.9 4.3 3.5 3.7  CL >130* >130* >130* 129* 125*  CO2 20* 18* 13* 14* 14*  GLUCOSE 101* 155* 122* 93 74  BUN 105* 76* 61* 46* 27*  CREATININE 3.79* 2.61* 1.96* 1.54* 1.16*  CALCIUM 9.1 8.6* 8.7* 8.5* 8.3*   GFR: Estimated Creatinine Clearance: 40 mL/min (A) (by C-G formula based on SCr of 1.16 mg/dL (H)). Liver Function Tests: Recent Labs  Lab 08/28/18 1410  AST 84*  ALT 74*  ALKPHOS 79  BILITOT 0.7  PROT 7.3  ALBUMIN 3.3*   No results for input(s): LIPASE, AMYLASE in the last 168 hours. No results for input(s): AMMONIA in the last 168 hours. Coagulation Profile: No results for input(s): INR, PROTIME in the last 168 hours. Cardiac Enzymes: Recent Labs  Lab 08/28/18 1410  TROPONINI 0.06*   BNP (last 3 results) No results for input(s): PROBNP in the last 8760 hours. HbA1C: No results for input(s): HGBA1C in the last 72 hours. CBG: No results for input(s): GLUCAP in the last 168 hours. Lipid Profile: No results for input(s): CHOL, HDL, LDLCALC, TRIG, CHOLHDL, LDLDIRECT in the last  72 hours. Thyroid Function Tests: No results for input(s): TSH, T4TOTAL, FREET4, T3FREE, THYROIDAB in the last 72 hours. Anemia Panel: No results for input(s): VITAMINB12, FOLATE, FERRITIN, TIBC, IRON, RETICCTPCT in the last 72 hours. Urine analysis:    Component Value Date/Time   COLORURINE YELLOW 08/31/2018 1420   APPEARANCEUR HAZY (A) 08/31/2018 1420   LABSPEC 1.009 08/31/2018 1420   PHURINE 5.0 08/31/2018 1420   GLUCOSEU NEGATIVE 08/31/2018 1420   HGBUR SMALL (A) 08/31/2018 1420   BILIRUBINUR NEGATIVE 08/31/2018 1420   KETONESUR NEGATIVE 08/31/2018 1420   PROTEINUR NEGATIVE 08/31/2018 1420   UROBILINOGEN 1.0 01/23/2015 1656   NITRITE NEGATIVE 08/31/2018 1420   LEUKOCYTESUR NEGATIVE 08/31/2018 1420   Sepsis Labs: Invalid input(s): PROCALCITONIN, LACTICIDVEN  Recent Results (from the past 240 hour(s))  Culture, blood (routine x 2)     Status: None (Preliminary result)   Collection Time: 08/29/18  4:50 PM  Result Value Ref Range Status   Specimen Description BLOOD RIGHT ANTECUBITAL  Final   Special Requests  Final    BOTTLES DRAWN AEROBIC ONLY Blood Culture adequate volume   Culture   Final    NO GROWTH 3 DAYS Performed at [facility], [place] [address] [place], [place] [address]    Report Status PENDING  Incomplete  Culture, blood (routine x 2)     Status: None (Preliminary result)   Collection Time: 08/29/18  4:54 PM  Result Value Ref Range Status   Specimen Description BLOOD LEFT HAND  Final   Special Requests   Final    BOTTLES DRAWN AEROBIC ONLY Blood Culture results may not be optimal due to an inadequate volume of blood received in culture bottles   Culture   Final    NO GROWTH 3 DAYS Performed at [facility], [place] [address] [place], [place] [address]    Report Status PENDING  Incomplete  Culture, Urine     Status: None   Collection Time: 08/31/18  1:50 PM  Result Value Ref Range Status   Specimen Description URINE, CATHETERIZED  Final    Special Requests NONE  Final   Culture   Final    NO GROWTH Performed at [facility], [address] [place], [place] [address]    Report Status 09/01/2018 FINAL  Final      Radiology Studies: No results found.   Marzetta Board, MD, PhD [facility]  Contact via  [email or web address]  [place] P: [phone]  F: [phone]

## 2018-09-01 NOTE — Care Management Important Message (Signed)
Important Message  Patient Details  Name: Tanya Jensen MRN: [id number] Date of Birth: 1945-08-22   Medicare Important Message Given:  Yes    Orbie Pyo 09/01/2018, 2:15 PM

## 2018-09-02 DIAGNOSIS — I10 Essential (primary) hypertension: Secondary | ICD-10-CM | POA: Diagnosis not present

## 2018-09-02 DIAGNOSIS — M25561 Pain in right knee: Secondary | ICD-10-CM | POA: Diagnosis not present

## 2018-09-02 DIAGNOSIS — G3183 Dementia with Lewy bodies: Secondary | ICD-10-CM | POA: Diagnosis not present

## 2018-09-02 DIAGNOSIS — R0902 Hypoxemia: Secondary | ICD-10-CM

## 2018-09-02 DIAGNOSIS — K5904 Chronic idiopathic constipation: Secondary | ICD-10-CM | POA: Diagnosis not present

## 2018-09-02 DIAGNOSIS — N179 Acute kidney failure, unspecified: Secondary | ICD-10-CM | POA: Diagnosis not present

## 2018-09-02 LAB — BASIC METABOLIC PANEL
Anion gap: 6 (ref 5–15)
BUN: 16 mg/dL (ref 8–23)
CO2: 14 mmol/L — ABNORMAL LOW (ref 22–32)
Calcium: 8 mg/dL — ABNORMAL LOW (ref 8.9–10.3)
Chloride: 125 mmol/L — ABNORMAL HIGH (ref 98–111)
Creatinine, Ser: 0.93 mg/dL (ref 0.44–1.00)
GFR calc Af Amer: >60 mL/min (ref >60)
GFR calc non Af Amer: >60 mL/min (ref >60)
Glucose, Bld: 89 mg/dL (ref 70–99)
Potassium: 3.3 mmol/L — ABNORMAL LOW (ref 3.5–5.1)
Sodium: 145 mmol/L (ref 135–145)

## 2018-09-02 MED ORDER — CEFPODOXIME PROXETIL 200 MG PO TABS: 200.0000 mg | ORAL_TABLET | Freq: Two times a day (BID) | ORAL | 0 refills | Status: DC

## 2018-09-02 MED ORDER — POTASSIUM CHLORIDE CRYS ER 20 MEQ PO TBCR: 40.0000 meq | EXTENDED_RELEASE_TABLET | Freq: Once | ORAL | Status: DC

## 2018-09-02 NOTE — Discharge Instructions (Signed)
Follow with Tanya Frees, MD in 5-7 days. Repeat renal function  Please get a complete blood count and chemistry panel checked by your Primary MD at your next visit, and again as instructed by your Primary MD. Please get your medications reviewed and adjusted by your Primary MD.  Please request your Primary MD to go over all Hospital Tests and Procedure/Radiological results at the follow up, please get all Hospital records sent to your Prim MD by signing hospital release before you go home.  In some cases, there will be blood work, cultures and biopsy results pending at the time of your discharge. Please request that your primary care M.D. goes through all the records of your hospital data and follows up on these results.  If you had Pneumonia of Lung problems at the Hospital: Please get a 2 view Chest X ray done in 6-8 weeks after hospital discharge or sooner if instructed by your Primary MD.  If you have Congestive Heart Failure: Please call your Cardiologist or Primary MD anytime you have any of the following symptoms:  1) 3 pound weight gain in 24 hours or 5 pounds in 1 week  2) shortness of breath, with or without a dry hacking cough  3) swelling in the hands, feet or stomach  4) if you have to sleep on extra pillows at night in order to breathe  Follow cardiac low salt diet and 1.5 lit/day fluid restriction.  If you have diabetes Accuchecks 4 times/day, Once in AM empty stomach and then before each meal. Log in all results and show them to your primary doctor at your next visit. If any glucose reading is under 80 or above 300 call your primary MD immediately.  If you have Seizure/Convulsions/Epilepsy: Please do not drive, operate heavy machinery, participate in activities at heights or participate in high speed sports until you have seen by Primary MD or a Neurologist and advised to do so again.  If you had Gastrointestinal Bleeding: Please ask your Primary MD to check a  complete blood count within one week of discharge or at your next visit. Your endoscopic/colonoscopic biopsies that are pending at the time of discharge, will also need to followed by your Primary MD.  Get Medicines reviewed and adjusted. Please take all your medications with you for your next visit with your Primary MD  Please request your Primary MD to go over all hospital tests and procedure/radiological results at the follow up, please ask your Primary MD to get all Hospital records sent to his/her office.  If you experience worsening of your admission symptoms, develop shortness of breath, life threatening emergency, suicidal or homicidal thoughts you must seek medical attention immediately by calling 911 or calling your MD immediately  if symptoms less severe.  You must read complete instructions/literature along with all the possible adverse reactions/side effects for all the Medicines you take and that have been prescribed to you. Take any new Medicines after you have completely understood and accpet all the possible adverse reactions/side effects.   Do not drive or operate heavy machinery when taking Pain medications.   Do not take more than prescribed Pain, Sleep and Anxiety Medications  Special Instructions: If you have smoked or chewed Tobacco  in the last 2 yrs please stop smoking, stop any regular Alcohol  and or any Recreational drug use.  Wear Seat belts while driving.  Please note You were cared for by a hospitalist during your hospital stay. If you have any questions  about your discharge medications or the care you received while you were in the hospital after you are discharged, you can call the unit and asked to speak with the hospitalist on call if the hospitalist that took care of you is not available. Once you are discharged, your primary care physician will handle any further medical issues. Please note that NO REFILLS for any discharge medications will be authorized once  you are discharged, as it is imperative that you return to your primary care physician (or establish a relationship with a primary care physician if you do not have one) for your aftercare needs so that they can reassess your need for medications and monitor your lab values.  You can reach the hospitalist office at phone [phone] or fax [phone]   If you do not have a primary care physician, you can call [phone] for a physician referral.  Activity: As tolerated with Full fall precautions use walker/cane & assistance as needed    Diet: regular  Disposition Home

## 2018-09-02 NOTE — TOC Transition Note (Signed)
Transition of Care [facility]) - CM/SW Discharge Note Marvetta Gibbons RN,BSN Transitions of Care Cross Coverage for 5W - RN Case Manager [phone]  Patient Details  Name: Tanya Jensen MRN: [id number] Date of Birth: 1945-11-02  Transition of Care [facility]) CM/SW Contact:  Dawayne Patricia, RN Phone Number: 09/02/2018, 11:35 AM   Clinical Narrative:    Pt for transition home today with spouse, hx of dementia and Alzheimer. Orders placed for HHPT/OT/aide. CM tried to place call to spouse, no answer. Went to pt's room and spouse at bedside. Discussed with spouse HH orders and DME needs- and offered choice with list provided Per CMS guidelines from medicare.gov website with star ratings (copy placed in shadow chart)- per spouse- he has 2 private duty aides that come to assist no [facility] services currently- he has selected [facility] for the [facility] needs PT/OT. Spouse also agreeable to use [employer] for DME needs and have delivered to room- wants both 3n1 and RW.  Call made to [facility] with [place] for [facility] referral- referral has been accepted. Call also made for DME needs- to [facility] with [employer] for 3n1 and RW- DME to be delivered to room prior to discharge. Spouse to transport home.    Final next level of care: Home w Home Health Services Barriers to Discharge: No Barriers Identified   Patient Goals and CMS Choice Patient states their goals for this hospitalization and ongoing recovery are:: "to get her home"(per spouse) CMS Medicare.gov Compare Post Acute Care list provided to:: Patient Represenative (must comment) Choice offered to / list presented to : Spouse  Discharge Placement  Pt to return home with spouse, [place] arranged.                      Discharge Plan and Services   Discharge Planning Services: CM Consult Post Acute Care Choice: Durable Medical Equipment          DME Arranged: 3-N-1, Walker rolling DME Agency: [employer] Date DME Agency Contacted: 09/02/18 Time DME Agency Contacted:  1122 Representative spoke with at DME Agency: [place]: PT, OT [place] Agency: [place] Date [place]: 09/02/18 Time [place]: 1126 Representative spoke with at [place]: [place] ([place]) Interventions     Readmission Risk Interventions Readmission Risk Prevention Plan 09/02/2018  Transportation Screening Complete  PCP or Specialist Appt within 5-7 Days Complete  Home Care Screening Complete  Medication Review (RN CM) Complete  Some recent data might be hidden

## 2018-09-02 NOTE — Progress Notes (Signed)
Pt discharged to home. I removed peripheral IV, tele box removed. Pt's spouse to drive patient home. DME to be delivered to room. I gave the AVS and script for antibiotic to spouse and discussed them.

## 2018-09-02 NOTE — Discharge Summary (Signed)
Physician Discharge Summary  Tanya Jensen ZSW:[id number] DOB: 02-Nov-1945 DOA: 08/28/2018  PCP: Shirline Frees, MD  Admit date: 08/28/2018 Discharge date: 09/02/2018  Admitted From: home Disposition:  Home   Recommendations for Outpatient Follow-up:  1. Follow up with PCP in 4-5 days 2. Please obtain BMP at follow up  Home Health: none  Equipment/Devices: none  Discharge Condition: stable CODE STATUS: DNR Diet recommendation: regular  HPI: Per admitting MD, Tanya Jensen is a 73 y.o. female with medical history significant of dementia and HTN presenting with abdominal pain. Her husband reports that at baseline she can walk without assistance; talk sometimes incoherently but generally remembers him; and can feed herself.  Over the last 2-3 weeks, she has not been eating or drinking well.  He thought maybe she was constipated and so called 911 on Friday; EMS recommended that they try laxatives.  She had a BM and appeared to feel a little better.  She ate Saturday morning, but was sleeping more and fidgeting more throughout the weekend.  Today, he noticed that her fingers were blue on the tips.  She has had weight loss; she was as high as 325 pounds in the last but was 143 and then 137 last September. ED Course:  Not eating, drinking for a week.  ?constipation, increased confusion.  VSS.  Husband noticed that fingertips were blue, but normal sats.  Needs SDU given severe dehydration.  Hospital Course: Principal Problem Acute kidney -Creatinine around 8 on admission, this is likely prerenal in the setting of poor p.o. intake over the last week.  She received IV fluids and her creatinine has normalized to 0.9 on discharge.  I recommended to encourage p.o. intake and fluid intake at home and follow-up with PCP within 4 to 5 days for repeat BMP.  Husband expressed understanding.  Active Problems Acute metabolic encephalopathy superimposed on top of advanced dementia /Sirs on admission -Her  dementia is chronic.  She was slightly worse with decreased p.o. intake in the last several days.  Patient had no complaints however husband noticed that she was having abdominal discomfort which may be due to constipation seen on the CT scan versus possible UTI.  She did not get a urinalysis or urine cultures on admission, likely because of incontinence but has history of UTIs in the past. It is likely that she may have had a UTI given response to antibiotics and fluids, which may have caused her to have poor p.o. intake resulting in renal failure.  She received cefepime while hospitalized and was transitioned to cefpodoxime for 2 additional days  Hypernatremia -Likely in the setting of dehydration, free water deficit.  Sodium normalized with fluids   Hypertension -Hold home medications due to hypotension on admission.  She remained normotensive and her lisinopril has been discontinued on discharge, also in the setting of acute kidney injury.  Thrombocytopenia -Possibly related to underlying infectious process, or may be dilutional given all lines went down.  Platelets now improving  Acute hypoxic respiratory failure -Resolved, on room air  Constipation -Resolved with enema  Discharge Diagnoses:  Principal Problem:   Dehydration with hypernatremia Active Problems:   Lewy body dementia with behavioral disturbance (HCC)   Essential hypertension   Open-angle glaucoma, severe stage   Acute renal failure (ARF) ([place])   Palliative care by specialist   Adult failure to thrive syndrome     Discharge Instructions   Allergies as of 09/02/2018   No Known Allergies  Medication List    STOP taking these medications   lisinopril 20 MG tablet Commonly known as:  ZESTRIL     TAKE these medications   atropine 1 % ophthalmic solution Place 1 drop into both eyes daily.   bimatoprost 0.03 % ophthalmic solution Commonly known as:  LUMIGAN Place 1 drop into both eyes at bedtime.    cefpodoxime 200 MG tablet Commonly known as:  VANTIN Take 1 tablet (200 mg total) by mouth 2 (two) times daily.   dorzolamide-timolol 22.3-6.8 MG/ML ophthalmic solution Commonly known as:  COSOPT Place 1 drop into both eyes 2 (two) times daily.   fluorometholone 0.1 % ophthalmic suspension Commonly known as:  FML Place 1 drop into both eyes 2 (two) times daily.   OVER THE COUNTER MEDICATION Take 1 tablet by mouth 2 (two) times daily. *[place]*   Prolensa 0.07 % Soln Generic drug:  Bromfenac Sodium Apply 1 drop to eye 2 (two) times daily.   traZODone 150 MG tablet Commonly known as:  DESYREL Take 150 mg by mouth at bedtime.            Durable Medical Equipment  (From admission, onward)         Start     Ordered   08/31/18 1531  For home use only DME 3 n 1  Once     08/31/18 1531   08/31/18 1531  For home use only DME Walker rolling  Once    Question:  Patient needs a walker to treat with the following condition  Answer:  Weakness   08/31/18 1531         Follow-up Information    Shirline Frees, MD. Schedule an appointment as soon as possible for a visit in 4 day(s).   Specialty:  Family Medicine Why:  for repeat renal function Contact information: [place] [address] [phone]        Care, [facility] Follow up.   Specialty:  [place] Why:  HHPT/OT arranged- they will call you to set up start of care Contact information: [address] [place] [address] [phone]        [place] Follow up.   Why:  rolling walker and 3n1 arranged- to be delivered to room prior to discharge          Consultations:  Palliative care  Procedures/Studies:  Ct Abdomen Pelvis Wo Contrast  Result Date: 08/28/2018 CLINICAL DATA:  73 year old with dementia who has acute mental status changes and recent stools that have been black and tarry. Surgical history includes appendectomy and  tubal ligation. EXAM: CT ABDOMEN AND PELVIS WITHOUT CONTRAST TECHNIQUE: Multidetector CT imaging of the abdomen and pelvis was performed following the standard protocol without IV contrast. COMPARISON:  None. FINDINGS: Beam hardening streak artifact is present on many of the images as the patient was unable to raise the arms. Lower chest: Heart size normal.  Visualized lung bases clear. Hepatobiliary: Allowing for the beam hardening streak artifact, no focal abnormalities involving the liver, allowing for the unenhanced technique. Gallbladder normal in appearance without calcified gallstones. No biliary ductal dilation. Pancreas: Allowing for the beam hardening streak artifact, normal unenhanced appearance. Spleen: Allowing for the beam hardening streak artifact, normal unenhanced appearance. Adrenals/Urinary Tract: Normal appearing adrenal glands. Non-obstructing approximate 4 mm calculus in a mid calyx of the LEFT kidney. No urinary tract calculi elsewhere on either side. Allowing for the beam hardening streak artifact and  the unenhanced technique, no focal parenchymal abnormality involving either kidney. Normal appearing urinary bladder (which is displaced anteriorly by a massively distended stool-filled rectum). Stomach/Bowel: Stomach normal in appearance for the degree of distention. Normal-appearing small bowel. Several loops of jejunum in the LEFT UPPER QUADRANT are LATERAL to the descending colon. Massively distended stool-filled rectum with circumferential wall thickening of the distal rectum near the anal verge. Moderate colonic stool burden elsewhere. Sigmoid colon elongated and tortuous. No focal colonic abnormalities otherwise. Surgically absent appendix. Vascular/Lymphatic: Mild aortic atherosclerosis without evidence of aneurysm. No pathologic lymphadenopathy. Reproductive: Normal appearing uterus displaced anteriorly by the massively distended rectum. No adnexal masses. Other: Numerous pelvic  phleboliths. Musculoskeletal: Facet degenerative changes involving the LOWER lumbar spine with degenerative grade 1-2 spondylolisthesis of L4 on L5 measuring approximately 11 mm. Irregularity involving the LOWER endplate of L5 is likely chronic and related to degenerative changes. IMPRESSION: 1. Massively distended rectum filled with stool with circumferential wall thickening involving the distal rectum near the anal verge, suspect fecal impaction and possible proctitis. 2. No acute abnormalities otherwise involving the abdomen or pelvis. 3. Nonobstructing 4 mm calculus in a mid calyx of the left kidney. 4. Several loops of jejunum in the left upper quadrant are LATERAL to the descending colon raising the question of an internal hernia. No evidence of small bowel obstruction. Electronically Signed   By: Evangeline Dakin M.D.   On: 08/28/2018 16:41   Dg Chest Port 1 View  Result Date: 08/28/2018 CLINICAL DATA:  Current history of dementia, presenting with acute mental status changes and hypoxia. Current history of hypertension. EXAM: PORTABLE CHEST 1 VIEW COMPARISON:  11/28/2016, 03/11/2012. FINDINGS: Cardiac silhouette normal in size for AP portable technique, unchanged. Lungs clear. Bronchovascular markings normal. Pulmonary vascularity normal. No visible pleural effusions. No pneumothorax. IMPRESSION: No acute cardiopulmonary disease. Electronically Signed   By: Evangeline Dakin M.D.   On: 08/28/2018 15:03      Subjective: -Underlying dementia, confused, but pleasant  Discharge Exam: BP 120/77 (BP Location: Right Leg)   Pulse (!) 59   Temp (!) 97.5 F (36.4 C) (Oral)   Resp 18   Ht 5\' 5"  (1.651 m)   Wt 64.1 kg   SpO2 100%   BMI 23.52 kg/m   General: Pt is alert, awake, not in acute distress Cardiovascular: RRR, S1/S2 +, no rubs, no gallops Respiratory: CTA bilaterally, no wheezing, no rhonchi Abdominal: Soft, NT, ND, bowel sounds + Extremities: no edema, no cyanosis    The results  of significant diagnostics from this hospitalization (including imaging, microbiology, ancillary and laboratory) are listed below for reference.     Microbiology: Recent Results (from the past 240 hour(s))  Culture, blood (routine x 2)     Status: None (Preliminary result)   Collection Time: 08/29/18  4:50 PM  Result Value Ref Range Status   Specimen Description BLOOD RIGHT ANTECUBITAL  Final   Special Requests   Final    BOTTLES DRAWN AEROBIC ONLY Blood Culture adequate volume   Culture   Final    NO GROWTH 3 DAYS Performed at [facility], [address] [place],  [address]    Report Status PENDING  Incomplete  Culture, blood (routine x 2)     Status: None (Preliminary result)   Collection Time: 08/29/18  4:54 PM  Result Value Ref Range Status   Specimen Description BLOOD LEFT HAND  Final   Special Requests   Final    BOTTLES DRAWN AEROBIC ONLY  Blood Culture results may not be optimal due to an inadequate volume of blood received in culture bottles   Culture   Final    NO GROWTH 3 DAYS Performed at [facility], [place] [address], [place], [place] [address]    Report Status PENDING  Incomplete  Culture, Urine     Status: None   Collection Time: 08/31/18  1:50 PM  Result Value Ref Range Status   Specimen Description URINE, CATHETERIZED  Final   Special Requests NONE  Final   Culture   Final    NO GROWTH Performed at [facility], [address], [place], [place] [address]    Report Status 09/01/2018 FINAL  Final     Labs: BNP (last 3 results) No results for input(s): BNP in the last 8760 hours. Basic Metabolic Panel: Recent Labs  Lab 08/30/18 0615 08/30/18 1626 08/31/18 0308 09/01/18 0613 09/02/18 0821  NA 161* 154* 149* 146* 145  K 3.9 4.3 3.5 3.7 3.3*  CL >130* >130* 129* 125* 125*  CO2 18* 13* 14* 14* 14*  GLUCOSE 155* 122* 93 74 89  BUN 76* 61* 46* 27* 16  CREATININE 2.61* 1.96* 1.54* 1.16* 0.93  CALCIUM 8.6* 8.7* 8.5* 8.3* 8.0*    Liver Function Tests: Recent Labs  Lab 08/28/18 1410  AST 84*  ALT 74*  ALKPHOS 79  BILITOT 0.7  PROT 7.3  ALBUMIN 3.3*   No results for input(s): LIPASE, AMYLASE in the last 168 hours. No results for input(s): AMMONIA in the last 168 hours. CBC: Recent Labs  Lab 08/28/18 1410 08/29/18 0306 08/30/18 0615 08/31/18 0308 09/01/18 0613  WBC 11.6* 10.3 6.3 6.5 6.9  HGB 12.5 11.7* 9.6* 9.4* 10.1*  HCT 40.0 36.2 30.7* 27.8* 30.6*  MCV 101.8* 98.4 99.4 94.2 95.3  PLT 103* 86* 87* 67* 87*   Cardiac Enzymes: Recent Labs  Lab 08/28/18 1410  TROPONINI 0.06*   BNP: Invalid input(s): POCBNP CBG: No results for input(s): GLUCAP in the last 168 hours. D-Dimer No results for input(s): DDIMER in the last 72 hours. Hgb A1c No results for input(s): HGBA1C in the last 72 hours. Lipid Profile No results for input(s): CHOL, HDL, LDLCALC, TRIG, CHOLHDL, LDLDIRECT in the last 72 hours. Thyroid function studies No results for input(s): TSH, T4TOTAL, T3FREE, THYROIDAB in the last 72 hours.  Invalid input(s): FREET3 Anemia work up No results for input(s): VITAMINB12, FOLATE, FERRITIN, TIBC, IRON, RETICCTPCT in the last 72 hours. Urinalysis    Component Value Date/Time   COLORURINE YELLOW 08/31/2018 1420   APPEARANCEUR HAZY (A) 08/31/2018 1420   LABSPEC 1.009 08/31/2018 1420   PHURINE 5.0 08/31/2018 1420   GLUCOSEU NEGATIVE 08/31/2018 1420   HGBUR SMALL (A) 08/31/2018 1420   BILIRUBINUR NEGATIVE 08/31/2018 1420   KETONESUR NEGATIVE 08/31/2018 1420   PROTEINUR NEGATIVE 08/31/2018 1420   UROBILINOGEN 1.0 01/23/2015 1656   NITRITE NEGATIVE 08/31/2018 1420   LEUKOCYTESUR NEGATIVE 08/31/2018 1420   Sepsis Labs Invalid input(s): PROCALCITONIN,  WBC,  LACTICIDVEN  FURTHER DISCHARGE INSTRUCTIONS:   Get Medicines reviewed and adjusted: Please take all your medications with you for your next visit with your Primary MD   Laboratory/radiological data: Please request your Primary MD  to go over all hospital tests and procedure/radiological results at the follow up, please ask your Primary MD to get all Hospital records sent to his/her office.   In some cases, they will be blood work, cultures and biopsy results pending at the time of  your discharge. Please request that your primary care M.D. goes through all the records of your hospital data and follows up on these results.   Also Note the following: If you experience worsening of your admission symptoms, develop shortness of breath, life threatening emergency, suicidal or homicidal thoughts you must seek medical attention immediately by calling 911 or calling your MD immediately  if symptoms less severe.   You must read complete instructions/literature along with all the possible adverse reactions/side effects for all the Medicines you take and that have been prescribed to you. Take any new Medicines after you have completely understood and accpet all the possible adverse reactions/side effects.    Do not drive when taking Pain medications or sleeping medications (Benzodaizepines)   Do not take more than prescribed Pain, Sleep and Anxiety Medications. It is not advisable to combine anxiety,sleep and pain medications without talking with your primary care practitioner   Special Instructions: If you have smoked or chewed Tobacco  in the last 2 yrs please stop smoking, stop any regular Alcohol  and or any Recreational drug use.   Wear Seat belts while driving.   Please note: You were cared for by a hospitalist during your hospital stay. Once you are discharged, your primary care physician will handle any further medical issues. Please note that NO REFILLS for any discharge medications will be authorized once you are discharged, as it is imperative that you return to your primary care physician (or establish a relationship with a primary care physician if you do not have one) for your post hospital discharge needs so that they can  reassess your need for medications and monitor your lab values.  Time coordinating discharge: 35 minutes  SIGNED:  Marzetta Board, MD, PhD 09/02/2018, 2:26 PM

## 2018-09-03 DIAGNOSIS — H4010X3 Unspecified open-angle glaucoma, severe stage: Secondary | ICD-10-CM | POA: Diagnosis not present

## 2018-09-03 DIAGNOSIS — M47816 Spondylosis without myelopathy or radiculopathy, lumbar region: Secondary | ICD-10-CM | POA: Diagnosis not present

## 2018-09-03 DIAGNOSIS — M4316 Spondylolisthesis, lumbar region: Secondary | ICD-10-CM | POA: Diagnosis not present

## 2018-09-03 DIAGNOSIS — E860 Dehydration: Secondary | ICD-10-CM | POA: Diagnosis not present

## 2018-09-03 DIAGNOSIS — Z87440 Personal history of urinary (tract) infections: Secondary | ICD-10-CM | POA: Diagnosis not present

## 2018-09-03 DIAGNOSIS — R627 Adult failure to thrive: Secondary | ICD-10-CM | POA: Diagnosis not present

## 2018-09-03 DIAGNOSIS — S90821D Blister (nonthermal), right foot, subsequent encounter: Secondary | ICD-10-CM | POA: Diagnosis not present

## 2018-09-03 DIAGNOSIS — N200 Calculus of kidney: Secondary | ICD-10-CM | POA: Diagnosis not present

## 2018-09-03 DIAGNOSIS — D696 Thrombocytopenia, unspecified: Secondary | ICD-10-CM | POA: Diagnosis not present

## 2018-09-03 DIAGNOSIS — G3183 Dementia with Lewy bodies: Secondary | ICD-10-CM | POA: Diagnosis not present

## 2018-09-03 DIAGNOSIS — G9341 Metabolic encephalopathy: Secondary | ICD-10-CM | POA: Diagnosis not present

## 2018-09-03 DIAGNOSIS — F0280 Dementia in other diseases classified elsewhere without behavioral disturbance: Secondary | ICD-10-CM | POA: Diagnosis not present

## 2018-09-03 DIAGNOSIS — K5641 Fecal impaction: Secondary | ICD-10-CM | POA: Diagnosis not present

## 2018-09-03 DIAGNOSIS — I10 Essential (primary) hypertension: Secondary | ICD-10-CM | POA: Diagnosis not present

## 2018-09-03 LAB — CULTURE, BLOOD (ROUTINE X 2)
Culture: NO GROWTH
Culture: NO GROWTH
Special Requests: ADEQUATE

## 2018-09-05 DIAGNOSIS — G9341 Metabolic encephalopathy: Secondary | ICD-10-CM | POA: Diagnosis not present

## 2018-09-05 DIAGNOSIS — G3183 Dementia with Lewy bodies: Secondary | ICD-10-CM | POA: Diagnosis not present

## 2018-09-05 DIAGNOSIS — E860 Dehydration: Secondary | ICD-10-CM | POA: Diagnosis not present

## 2018-09-05 DIAGNOSIS — S90821D Blister (nonthermal), right foot, subsequent encounter: Secondary | ICD-10-CM | POA: Diagnosis not present

## 2018-09-05 DIAGNOSIS — F0280 Dementia in other diseases classified elsewhere without behavioral disturbance: Secondary | ICD-10-CM | POA: Diagnosis not present

## 2018-09-05 DIAGNOSIS — M25561 Pain in right knee: Secondary | ICD-10-CM | POA: Diagnosis not present

## 2018-09-05 DIAGNOSIS — G8929 Other chronic pain: Secondary | ICD-10-CM | POA: Diagnosis not present

## 2018-09-05 DIAGNOSIS — K5641 Fecal impaction: Secondary | ICD-10-CM | POA: Diagnosis not present

## 2018-09-07 DIAGNOSIS — S90821D Blister (nonthermal), right foot, subsequent encounter: Secondary | ICD-10-CM | POA: Diagnosis not present

## 2018-09-07 DIAGNOSIS — G3183 Dementia with Lewy bodies: Secondary | ICD-10-CM | POA: Diagnosis not present

## 2018-09-07 DIAGNOSIS — G9341 Metabolic encephalopathy: Secondary | ICD-10-CM | POA: Diagnosis not present

## 2018-09-07 DIAGNOSIS — K5641 Fecal impaction: Secondary | ICD-10-CM | POA: Diagnosis not present

## 2018-09-07 DIAGNOSIS — F0280 Dementia in other diseases classified elsewhere without behavioral disturbance: Secondary | ICD-10-CM | POA: Diagnosis not present

## 2018-09-07 DIAGNOSIS — E860 Dehydration: Secondary | ICD-10-CM | POA: Diagnosis not present

## 2018-09-09 DIAGNOSIS — F0280 Dementia in other diseases classified elsewhere without behavioral disturbance: Secondary | ICD-10-CM | POA: Diagnosis not present

## 2018-09-09 DIAGNOSIS — E860 Dehydration: Secondary | ICD-10-CM | POA: Diagnosis not present

## 2018-09-09 DIAGNOSIS — G3183 Dementia with Lewy bodies: Secondary | ICD-10-CM | POA: Diagnosis not present

## 2018-09-09 DIAGNOSIS — S90821D Blister (nonthermal), right foot, subsequent encounter: Secondary | ICD-10-CM | POA: Diagnosis not present

## 2018-09-09 DIAGNOSIS — K5641 Fecal impaction: Secondary | ICD-10-CM | POA: Diagnosis not present

## 2018-09-09 DIAGNOSIS — G9341 Metabolic encephalopathy: Secondary | ICD-10-CM | POA: Diagnosis not present

## 2018-09-12 DIAGNOSIS — E860 Dehydration: Secondary | ICD-10-CM | POA: Diagnosis not present

## 2018-09-12 DIAGNOSIS — M25561 Pain in right knee: Secondary | ICD-10-CM | POA: Diagnosis not present

## 2018-09-12 DIAGNOSIS — F0280 Dementia in other diseases classified elsewhere without behavioral disturbance: Secondary | ICD-10-CM | POA: Diagnosis not present

## 2018-09-12 DIAGNOSIS — S90821D Blister (nonthermal), right foot, subsequent encounter: Secondary | ICD-10-CM | POA: Diagnosis not present

## 2018-09-12 DIAGNOSIS — K5641 Fecal impaction: Secondary | ICD-10-CM | POA: Diagnosis not present

## 2018-09-12 DIAGNOSIS — G9341 Metabolic encephalopathy: Secondary | ICD-10-CM | POA: Diagnosis not present

## 2018-09-12 DIAGNOSIS — G3183 Dementia with Lewy bodies: Secondary | ICD-10-CM | POA: Diagnosis not present

## 2018-09-13 ENCOUNTER — Telehealth: Payer: Self-pay | Admitting: Neurology

## 2018-09-13 DIAGNOSIS — G3183 Dementia with Lewy bodies: Secondary | ICD-10-CM | POA: Diagnosis not present

## 2018-09-13 DIAGNOSIS — F0280 Dementia in other diseases classified elsewhere without behavioral disturbance: Secondary | ICD-10-CM | POA: Diagnosis not present

## 2018-09-13 DIAGNOSIS — G9341 Metabolic encephalopathy: Secondary | ICD-10-CM | POA: Diagnosis not present

## 2018-09-13 DIAGNOSIS — S90821D Blister (nonthermal), right foot, subsequent encounter: Secondary | ICD-10-CM | POA: Diagnosis not present

## 2018-09-13 DIAGNOSIS — E860 Dehydration: Secondary | ICD-10-CM | POA: Diagnosis not present

## 2018-09-13 DIAGNOSIS — K5641 Fecal impaction: Secondary | ICD-10-CM | POA: Diagnosis not present

## 2018-09-13 NOTE — Telephone Encounter (Signed)
I don't mind actually given ativan to my dementia patients for use as needed.

## 2018-09-13 NOTE — Telephone Encounter (Signed)
Spoke with the pt's husband. He stated pt has an eye doctor appt tomorrow and will have to sit there for a long time. I advised him to check with primary care to see what they advise as Dr. Jaynee Eagles does not prescribe calming medications for use in this situation. He verbalized appreciation and understanding.

## 2018-09-13 NOTE — Telephone Encounter (Signed)
Per Dr. Jaynee Eagles, will address if husband calls back.

## 2018-09-13 NOTE — Telephone Encounter (Signed)
Pt husband has called asking if since pt has an eye Dr appointment on tomorrow can Dr Jaynee Eagles call in something to help pt stay calm since the appointment will be long. Pt husband would like to use [facility] [phone]

## 2018-09-15 DIAGNOSIS — E860 Dehydration: Secondary | ICD-10-CM | POA: Diagnosis not present

## 2018-09-15 DIAGNOSIS — G9341 Metabolic encephalopathy: Secondary | ICD-10-CM | POA: Diagnosis not present

## 2018-09-15 DIAGNOSIS — G3183 Dementia with Lewy bodies: Secondary | ICD-10-CM | POA: Diagnosis not present

## 2018-09-15 DIAGNOSIS — F0280 Dementia in other diseases classified elsewhere without behavioral disturbance: Secondary | ICD-10-CM | POA: Diagnosis not present

## 2018-09-15 DIAGNOSIS — K5641 Fecal impaction: Secondary | ICD-10-CM | POA: Diagnosis not present

## 2018-09-15 DIAGNOSIS — S90821D Blister (nonthermal), right foot, subsequent encounter: Secondary | ICD-10-CM | POA: Diagnosis not present

## 2018-09-16 DIAGNOSIS — H401111 Primary open-angle glaucoma, right eye, mild stage: Secondary | ICD-10-CM | POA: Diagnosis not present

## 2018-09-16 DIAGNOSIS — Z961 Presence of intraocular lens: Secondary | ICD-10-CM | POA: Diagnosis not present

## 2018-09-16 DIAGNOSIS — H401123 Primary open-angle glaucoma, left eye, severe stage: Secondary | ICD-10-CM | POA: Diagnosis not present

## 2018-09-20 DIAGNOSIS — G3183 Dementia with Lewy bodies: Secondary | ICD-10-CM | POA: Diagnosis not present

## 2018-09-20 DIAGNOSIS — S90821D Blister (nonthermal), right foot, subsequent encounter: Secondary | ICD-10-CM | POA: Diagnosis not present

## 2018-09-20 DIAGNOSIS — G9341 Metabolic encephalopathy: Secondary | ICD-10-CM | POA: Diagnosis not present

## 2018-09-20 DIAGNOSIS — F0280 Dementia in other diseases classified elsewhere without behavioral disturbance: Secondary | ICD-10-CM | POA: Diagnosis not present

## 2018-09-20 DIAGNOSIS — E860 Dehydration: Secondary | ICD-10-CM | POA: Diagnosis not present

## 2018-09-20 DIAGNOSIS — K5641 Fecal impaction: Secondary | ICD-10-CM | POA: Diagnosis not present

## 2018-09-23 DIAGNOSIS — S90821D Blister (nonthermal), right foot, subsequent encounter: Secondary | ICD-10-CM | POA: Diagnosis not present

## 2018-09-23 DIAGNOSIS — E860 Dehydration: Secondary | ICD-10-CM | POA: Diagnosis not present

## 2018-09-23 DIAGNOSIS — G9341 Metabolic encephalopathy: Secondary | ICD-10-CM | POA: Diagnosis not present

## 2018-09-23 DIAGNOSIS — G3183 Dementia with Lewy bodies: Secondary | ICD-10-CM | POA: Diagnosis not present

## 2018-09-23 DIAGNOSIS — K5641 Fecal impaction: Secondary | ICD-10-CM | POA: Diagnosis not present

## 2018-09-23 DIAGNOSIS — F0280 Dementia in other diseases classified elsewhere without behavioral disturbance: Secondary | ICD-10-CM | POA: Diagnosis not present

## 2018-09-26 DIAGNOSIS — G3183 Dementia with Lewy bodies: Secondary | ICD-10-CM | POA: Diagnosis not present

## 2018-09-26 DIAGNOSIS — G9341 Metabolic encephalopathy: Secondary | ICD-10-CM | POA: Diagnosis not present

## 2018-09-26 DIAGNOSIS — E860 Dehydration: Secondary | ICD-10-CM | POA: Diagnosis not present

## 2018-09-26 DIAGNOSIS — S90821D Blister (nonthermal), right foot, subsequent encounter: Secondary | ICD-10-CM | POA: Diagnosis not present

## 2018-09-26 DIAGNOSIS — F0280 Dementia in other diseases classified elsewhere without behavioral disturbance: Secondary | ICD-10-CM | POA: Diagnosis not present

## 2018-09-26 DIAGNOSIS — K5641 Fecal impaction: Secondary | ICD-10-CM | POA: Diagnosis not present

## 2018-09-28 DIAGNOSIS — S90821D Blister (nonthermal), right foot, subsequent encounter: Secondary | ICD-10-CM | POA: Diagnosis not present

## 2018-09-28 DIAGNOSIS — G9341 Metabolic encephalopathy: Secondary | ICD-10-CM | POA: Diagnosis not present

## 2018-09-28 DIAGNOSIS — K5641 Fecal impaction: Secondary | ICD-10-CM | POA: Diagnosis not present

## 2018-09-28 DIAGNOSIS — G3183 Dementia with Lewy bodies: Secondary | ICD-10-CM | POA: Diagnosis not present

## 2018-09-28 DIAGNOSIS — E860 Dehydration: Secondary | ICD-10-CM | POA: Diagnosis not present

## 2018-09-28 DIAGNOSIS — F0280 Dementia in other diseases classified elsewhere without behavioral disturbance: Secondary | ICD-10-CM | POA: Diagnosis not present

## 2018-09-30 DIAGNOSIS — G9341 Metabolic encephalopathy: Secondary | ICD-10-CM | POA: Diagnosis not present

## 2018-09-30 DIAGNOSIS — F0280 Dementia in other diseases classified elsewhere without behavioral disturbance: Secondary | ICD-10-CM | POA: Diagnosis not present

## 2018-09-30 DIAGNOSIS — E860 Dehydration: Secondary | ICD-10-CM | POA: Diagnosis not present

## 2018-09-30 DIAGNOSIS — K5641 Fecal impaction: Secondary | ICD-10-CM | POA: Diagnosis not present

## 2018-09-30 DIAGNOSIS — G3183 Dementia with Lewy bodies: Secondary | ICD-10-CM | POA: Diagnosis not present

## 2018-09-30 DIAGNOSIS — S90821D Blister (nonthermal), right foot, subsequent encounter: Secondary | ICD-10-CM | POA: Diagnosis not present

## 2018-10-02 DIAGNOSIS — S90821D Blister (nonthermal), right foot, subsequent encounter: Secondary | ICD-10-CM | POA: Diagnosis not present

## 2018-10-02 DIAGNOSIS — G3183 Dementia with Lewy bodies: Secondary | ICD-10-CM | POA: Diagnosis not present

## 2018-10-02 DIAGNOSIS — G9341 Metabolic encephalopathy: Secondary | ICD-10-CM | POA: Diagnosis not present

## 2018-10-02 DIAGNOSIS — F0280 Dementia in other diseases classified elsewhere without behavioral disturbance: Secondary | ICD-10-CM | POA: Diagnosis not present

## 2018-10-02 DIAGNOSIS — K5641 Fecal impaction: Secondary | ICD-10-CM | POA: Diagnosis not present

## 2018-10-02 DIAGNOSIS — E860 Dehydration: Secondary | ICD-10-CM | POA: Diagnosis not present

## 2018-10-03 DIAGNOSIS — G3183 Dementia with Lewy bodies: Secondary | ICD-10-CM | POA: Diagnosis not present

## 2018-10-03 DIAGNOSIS — R627 Adult failure to thrive: Secondary | ICD-10-CM | POA: Diagnosis not present

## 2018-10-03 DIAGNOSIS — Z1159 Encounter for screening for other viral diseases: Secondary | ICD-10-CM | POA: Diagnosis not present

## 2018-10-03 DIAGNOSIS — H4010X3 Unspecified open-angle glaucoma, severe stage: Secondary | ICD-10-CM | POA: Diagnosis not present

## 2018-10-03 DIAGNOSIS — Z87440 Personal history of urinary (tract) infections: Secondary | ICD-10-CM | POA: Diagnosis not present

## 2018-10-03 DIAGNOSIS — M4316 Spondylolisthesis, lumbar region: Secondary | ICD-10-CM | POA: Diagnosis not present

## 2018-10-03 DIAGNOSIS — F0280 Dementia in other diseases classified elsewhere without behavioral disturbance: Secondary | ICD-10-CM | POA: Diagnosis not present

## 2018-10-03 DIAGNOSIS — E860 Dehydration: Secondary | ICD-10-CM | POA: Diagnosis not present

## 2018-10-03 DIAGNOSIS — D696 Thrombocytopenia, unspecified: Secondary | ICD-10-CM | POA: Diagnosis not present

## 2018-10-03 DIAGNOSIS — K5641 Fecal impaction: Secondary | ICD-10-CM | POA: Diagnosis not present

## 2018-10-03 DIAGNOSIS — S90821D Blister (nonthermal), right foot, subsequent encounter: Secondary | ICD-10-CM | POA: Diagnosis not present

## 2018-10-03 DIAGNOSIS — H401120 Primary open-angle glaucoma, left eye, stage unspecified: Secondary | ICD-10-CM | POA: Diagnosis not present

## 2018-10-03 DIAGNOSIS — I10 Essential (primary) hypertension: Secondary | ICD-10-CM | POA: Diagnosis not present

## 2018-10-03 DIAGNOSIS — N200 Calculus of kidney: Secondary | ICD-10-CM | POA: Diagnosis not present

## 2018-10-03 DIAGNOSIS — M47816 Spondylosis without myelopathy or radiculopathy, lumbar region: Secondary | ICD-10-CM | POA: Diagnosis not present

## 2018-10-03 DIAGNOSIS — G9341 Metabolic encephalopathy: Secondary | ICD-10-CM | POA: Diagnosis not present

## 2018-10-06 DIAGNOSIS — Z79899 Other long term (current) drug therapy: Secondary | ICD-10-CM | POA: Diagnosis not present

## 2018-10-06 DIAGNOSIS — Z9889 Other specified postprocedural states: Secondary | ICD-10-CM | POA: Diagnosis not present

## 2018-10-06 DIAGNOSIS — G3183 Dementia with Lewy bodies: Secondary | ICD-10-CM | POA: Diagnosis not present

## 2018-10-06 DIAGNOSIS — Z961 Presence of intraocular lens: Secondary | ICD-10-CM | POA: Diagnosis not present

## 2018-10-06 DIAGNOSIS — H401111 Primary open-angle glaucoma, right eye, mild stage: Secondary | ICD-10-CM | POA: Diagnosis not present

## 2018-10-06 DIAGNOSIS — Z9842 Cataract extraction status, left eye: Secondary | ICD-10-CM | POA: Diagnosis not present

## 2018-10-06 DIAGNOSIS — Z8669 Personal history of other diseases of the nervous system and sense organs: Secondary | ICD-10-CM | POA: Diagnosis not present

## 2018-10-06 DIAGNOSIS — H401123 Primary open-angle glaucoma, left eye, severe stage: Secondary | ICD-10-CM | POA: Diagnosis not present

## 2018-10-06 DIAGNOSIS — I10 Essential (primary) hypertension: Secondary | ICD-10-CM | POA: Diagnosis not present

## 2018-10-06 DIAGNOSIS — F0280 Dementia in other diseases classified elsewhere without behavioral disturbance: Secondary | ICD-10-CM | POA: Diagnosis not present

## 2018-10-06 DIAGNOSIS — H2013 Chronic iridocyclitis, bilateral: Secondary | ICD-10-CM | POA: Diagnosis not present

## 2018-10-06 DIAGNOSIS — Z9841 Cataract extraction status, right eye: Secondary | ICD-10-CM | POA: Diagnosis not present

## 2018-10-08 DIAGNOSIS — S90821D Blister (nonthermal), right foot, subsequent encounter: Secondary | ICD-10-CM | POA: Diagnosis not present

## 2018-10-08 DIAGNOSIS — G3183 Dementia with Lewy bodies: Secondary | ICD-10-CM | POA: Diagnosis not present

## 2018-10-08 DIAGNOSIS — F0280 Dementia in other diseases classified elsewhere without behavioral disturbance: Secondary | ICD-10-CM | POA: Diagnosis not present

## 2018-10-08 DIAGNOSIS — K5641 Fecal impaction: Secondary | ICD-10-CM | POA: Diagnosis not present

## 2018-10-08 DIAGNOSIS — E860 Dehydration: Secondary | ICD-10-CM | POA: Diagnosis not present

## 2018-10-08 DIAGNOSIS — G9341 Metabolic encephalopathy: Secondary | ICD-10-CM | POA: Diagnosis not present

## 2018-10-11 DIAGNOSIS — F0280 Dementia in other diseases classified elsewhere without behavioral disturbance: Secondary | ICD-10-CM | POA: Diagnosis not present

## 2018-10-11 DIAGNOSIS — K5641 Fecal impaction: Secondary | ICD-10-CM | POA: Diagnosis not present

## 2018-10-11 DIAGNOSIS — S90821D Blister (nonthermal), right foot, subsequent encounter: Secondary | ICD-10-CM | POA: Diagnosis not present

## 2018-10-11 DIAGNOSIS — G9341 Metabolic encephalopathy: Secondary | ICD-10-CM | POA: Diagnosis not present

## 2018-10-11 DIAGNOSIS — E860 Dehydration: Secondary | ICD-10-CM | POA: Diagnosis not present

## 2018-10-11 DIAGNOSIS — G3183 Dementia with Lewy bodies: Secondary | ICD-10-CM | POA: Diagnosis not present

## 2018-10-19 DIAGNOSIS — H35373 Puckering of macula, bilateral: Secondary | ICD-10-CM | POA: Diagnosis not present

## 2018-10-19 DIAGNOSIS — H3581 Retinal edema: Secondary | ICD-10-CM | POA: Diagnosis not present

## 2018-10-19 DIAGNOSIS — H2013 Chronic iridocyclitis, bilateral: Secondary | ICD-10-CM | POA: Diagnosis not present

## 2018-10-24 DIAGNOSIS — F0280 Dementia in other diseases classified elsewhere without behavioral disturbance: Secondary | ICD-10-CM | POA: Diagnosis not present

## 2018-10-24 DIAGNOSIS — E860 Dehydration: Secondary | ICD-10-CM | POA: Diagnosis not present

## 2018-10-24 DIAGNOSIS — G3183 Dementia with Lewy bodies: Secondary | ICD-10-CM | POA: Diagnosis not present

## 2018-10-24 DIAGNOSIS — G9341 Metabolic encephalopathy: Secondary | ICD-10-CM | POA: Diagnosis not present

## 2018-10-24 DIAGNOSIS — K5641 Fecal impaction: Secondary | ICD-10-CM | POA: Diagnosis not present

## 2018-10-24 DIAGNOSIS — S90821D Blister (nonthermal), right foot, subsequent encounter: Secondary | ICD-10-CM | POA: Diagnosis not present

## 2018-11-01 DIAGNOSIS — E441 Mild protein-calorie malnutrition: Secondary | ICD-10-CM | POA: Diagnosis not present

## 2018-11-01 DIAGNOSIS — D869 Sarcoidosis, unspecified: Secondary | ICD-10-CM | POA: Diagnosis not present

## 2018-11-01 DIAGNOSIS — F5101 Primary insomnia: Secondary | ICD-10-CM | POA: Diagnosis not present

## 2018-11-01 DIAGNOSIS — H401111 Primary open-angle glaucoma, right eye, mild stage: Secondary | ICD-10-CM | POA: Diagnosis not present

## 2018-11-01 DIAGNOSIS — Z736 Limitation of activities due to disability: Secondary | ICD-10-CM | POA: Diagnosis not present

## 2018-11-01 DIAGNOSIS — K5904 Chronic idiopathic constipation: Secondary | ICD-10-CM | POA: Diagnosis not present

## 2018-11-01 DIAGNOSIS — Z743 Need for continuous supervision: Secondary | ICD-10-CM | POA: Diagnosis not present

## 2018-11-01 DIAGNOSIS — G3183 Dementia with Lewy bodies: Secondary | ICD-10-CM | POA: Diagnosis not present

## 2019-02-10 ENCOUNTER — Telehealth: Payer: Self-pay | Admitting: Neurology

## 2019-02-10 NOTE — Telephone Encounter (Signed)
Pt husband(on DPR) has called asking if something can be called in to calm pt for her upcoming dental appointment on Tues 10-13, it is for cleaning and X-rays, husband states he wants pt to be able to open her mouth and cooperate during the appointment. Pt still using  [facility] [phone]

## 2019-02-12 NOTE — Telephone Encounter (Signed)
Bethany, I would call the dentist about this. I am really not sure if it would interfere with the procedure. The dentist would have to address that. Sorry.

## 2019-02-13 ENCOUNTER — Other Ambulatory Visit: Payer: Self-pay | Admitting: Neurology

## 2019-02-13 DIAGNOSIS — H35373 Puckering of macula, bilateral: Secondary | ICD-10-CM | POA: Diagnosis not present

## 2019-02-13 DIAGNOSIS — H2013 Chronic iridocyclitis, bilateral: Secondary | ICD-10-CM | POA: Diagnosis not present

## 2019-02-13 DIAGNOSIS — H3581 Retinal edema: Secondary | ICD-10-CM | POA: Diagnosis not present

## 2019-02-13 MED ORDER — LORAZEPAM 0.5 MG PO TABS: ORAL_TABLET | ORAL | 0 refills | Status: AC

## 2019-02-13 NOTE — Telephone Encounter (Signed)
Called pt's husband and relayed Dr. Cathren Laine message. [name] stated the dentist had advised him to call Dr. Jaynee Eagles. Per husband, the pt just needed something to help her stay calm during the visit. To his knowledge she has never taken anything like Xanax. I informed him I would check with Dr. Jaynee Eagles and call him back. He verbalized appreciation.

## 2019-02-13 NOTE — Telephone Encounter (Signed)
Per [place] registry, only medication listed is Lorazepam 0.5 mg from 10/2017.

## 2019-02-13 NOTE — Telephone Encounter (Signed)
I spoke with the pt's husband and discussed Dr. Cathren Laine message. He is aware and understands that we would not know exactly how the patient would react to the Lorazepam and she could become sleepy or obtunded. He would like to have pt try a low dose. He plans to crush in food. He understands to give this medication 30 minutes before the dental visit and he understands pt cannot drive with this medication which she doesn't anyway. He was advised the medication will have detailed information with it. Lorazepam to be sent to [facility] on [address]. I went ahead and scheduled a f/u with Amy NP next Wed 10/21 @ 9:30 arrival 9 AM. He verbalized appreciation for the call.

## 2019-02-13 NOTE — Telephone Encounter (Signed)
I can write for a low dose of lorazepam however I don;t know how she will react, sometimes it really makes then tired or obtunded. I can do that as long as they understand that I have no idea how long the procedure is or how alert she needs ot be thanks

## 2019-02-21 NOTE — Progress Notes (Deleted)
PATIENT: Tanya Jensen DOB: 1945/12/28  REASON FOR VISIT: follow up HISTORY FROM: patient  No chief complaint on file.    HISTORY OF PRESENT ILLNESS: Today 02/21/19 Tanya Jensen is a 73 y.o. female here today for follow up for LB dementia   HISTORY: (copied from Dr Cathren Laine note on 04/07/2018)  Interval history: Patient here for follow-up of dementia.  She has been doing well, hallucinations do not seem to be a problem anymore, she continues to decline, did not tolerate Aricept or Namenda, her sister is here and she drove 4 hours from [place].  Also here with her husband.  She has been having increased weight loss yet she is eating quite well.  Today we will check labs such as thyroid.  We will also check other dementia labs again today.  Discussed computers down they will come back to have the labs drawn.  Unclear etiology of significant weight loss.  She otherwise appears quite healthy however.  Interval history 10/06/2017: Here for follow up on Lewy Body Dementia. Significant decline today MMSE 8/30. The family feels she is better. She is on Trazodone at night prescribed by pcp for sleep and also helps with mood. She takes Risperdal for hallucinations/delusions. Her BP is low today and she is on Clonidine however asymptomatic. She has been trying to lose weight. We will stop the Risperdal. He gives it to her every other day, will stop doesn't appear she needs it if every other day. No symptoms of hypotension, she has lost 55 pounds, she gets agitated. Discussed risks especially sedation, falls, addiction. Discussed Nuplazid, in the future can continue. Hallucinations improved after stopping steroids. Sister here for the first time, reviewed history. Her sister Tanya Jensen is from [place] and they see eachother every month.    Interval history 01/06/2017: Hallucinations worsened with prednisone and improved since stopping. Doing well on the Risperidone. She has lost weight. No wandering, no calls  to police. Will try Exelon again twice a day, stop for any side effects. She is restless, may be a side effect of the Risperidal. Since adjusting medications due to low blood pressure she has not fallen, her blood pressure was very low and she was dizzy and doing better, no difficulty swallowing, some increased movements and balling up of her clothes with her hands, improved agitation. They have a caretaker that stays with her during the day. She walks around the house a lot, no wandering or calls to 911. She has lost about 40 pounds. No sleeping changes, they also have a sleeping aid to help at night.   Interval history 05/25/2016: Tanya Jensen is a 73 year old female with Lewy body dementia. She returns today for follow-up. The patient has tried Aricept, Namenda and Exelon but has not been able to tolerate the medication. She is on Risperdal 2mg  twice daily for hallucinations and agitation. She lives with her husband. She is crying today because she could not remember the date on the MMSE. She notices her memory loss and it is frustrating for her. Husband is using 1/2 a pill twice daily unless needed for episodes of hallucinations and agitation. Husband is home during the day and someone sits with patient when he is at work. Her hallucinations have been under good control. He has noticed prednisone can trigger hallucinations in her. She goes to bed at 7pm, she takes Trazodone at night and she sleeps all night. No falls. No dysphagia. She takes vitamin D and a daily multivitamin. She does  not cook, husband sets out her clothes but she can dress herself, she helps fold clothes, showers and toilets herself. Some confusion in the late afternoon when she says she wants to go home but is actually already at home. She plays the piano at home with assistance from husband, he is exercising her brain.  Interval update 11/27/2015:She is doing very well. She is on Risperdal 2mg  twice daily and we discussed the risks  associated with these medications. He has decreased to 1 pill in the morning 2mg  and discussed trying to limit as much as possible. He may give her 1/2 a pill in the afternoon if needed. She is not having any side effects to the Rispersdal. It has helped with the delusions and hallucinations. She has not left the house recently. No falls. She has some back pain and she is on a muscle relaxer. Sleeping well, no issues at night. She takes 1/2 a trazodone at night. Husband and wife is happy. There is a book, 36-hour day. She feels like she is doing very well. Mother and grandmother had dementia. Discussed Lewy body dementia. She was a Theme park manager, Tanya Jensen.   HISTORY OF PRESENT ILLNESS Tanya Jensen is a 73 year old female with Lewy body dementia. She returns today for follow-up. The patient has tried Aricept, Namenda and Exelon but has not been able to tolerate the medication. Her husband reports that she is having hallucinations again. Her hallucinations consist of seeing people in her home. The hallucinations are not violent or fearful. She will occasionally has some delusional thinking such as feeling as if her jewelry has been stolen. She also states to me when her husband is out of the room-she feels that he is trying to divorce her. The patient and her husband feel that her memory has remained stable. She is able to complete all ADLs independently. She is currently not operating a motor vehicle primarily due to her vision. The patient is having trouble sleeping at night and her primary care started her on trazodone 50 mg at bedtime. She is been taking this for 1 week but has not seen any benefit yet. She returns today for an evaluation.  interval update 06/12/2015: She had side effects to donepezil. She became more confused and talking in sleep. Stopping it helped. Could not tolerate the namzeric either, more confusion. Will start Rivastigmine which is better studied in lewy body dementia. No more  problems with leaving the house. She is doing well, patient and husband deny hallucinations since last being see, no swallowing difficulties, no falls, no accidents. I recommend patient not drive anymore. Husband is with patient all the time except when at work and patient is at home and doing well. She does go out a lot but she has friends come pick her up and take her out. Her appetite has been good, eatong a good diet, getting out and walking, mood is good. Everything has been going well. No falls. No issues since last being seen, husband and patient endorse all is well. She is comfortably staying at her home alone as well. No delusions, no recent hallucinations.   Interval update 03/12/2015: Wife says things are fine. No incidents since the Silver Alert in September. She left in the car and was stopped by a state trooper. She is still having visual hallucinations. She also has illusions and delusions. But not like she was having before. B12 wnl. TSH wnl.   HPI: NAINA APEL is a 73 y.o. female here  as a referral from Dr. Kenton Kingfisher for forgetfullness. PMHx HTN. She says she drives, she never gets lost. She was hallucinating. She has memory loss. She thought someone was in the home. Multiple times. She see people in church. She saw cowgirls one day. Daughter and husband are here. Daughter brought her to church and her mom hugged her and didn't remember her. Aroiund the age of 50 she started repeating things. Noticed it 8 years ago. Getting worse. Kids had a meeting with the husband and wanted a workup completed. More recent memory than remote. She is more combative. She is changing a little bit. She has glaucoma. Vision is worsening. She has a cataract developing on one eye. She was in bed one night, she was awakened . She saw a lady on the wall. She thought the headrest in the car were people. Last Thursday she thought she saw a white female in the bathroom. She is forgetting streets she has been on in the  past frequently. She calls multiple people telling them she is seeing things, says people are waiting in the car. She has called multiple people to check the house. She called someone from church 25 times. No shuffling gait or tremors. Patient's mother and grandmother had dementia, started around 85. She has lost her house and her car due to mismanagement of finances. They had to stay in a hotel when they lost everything. She is not bathing. She used to be very sharp, now she is forgetting she has rental property. Slowly progressive. She doesn't remember a lot of the incidents. She is getting lost. She should not be driving anywhere.    Reviewed notes, labs and imaging from outside physicians, which showed:  CT of the head 03/2012:personally reviewed and agree with findings below  Comparison: None.  Findings: The ventricles are normal in size, for this patient's age, and normal in configuration.  here are no parenchymal masses or mass effect. There are no areas of abnormal parenchymal attenuation. There is no evidence of a recent infarct.  There are no extra-axial masses or abnormal fluid collections.  No intracranial hemorrhage.  Right maxillary sinus mucosal thickening. The remaining visualized sinuses and mastoid air cells are clear. No skull lesion/fracture.  IMPRESSION: No intracranial abnormality. Right maxillary sinus mucosal thickening.  TSH wnl, ACE <14,    REVIEW OF SYSTEMS: Out of a complete 14 system review of symptoms, the patient complains only of the following symptoms, and all other reviewed systems are negative.  ALLERGIES: No Known Allergies  HOME MEDICATIONS: Outpatient Medications Prior to Visit  Medication Sig Dispense Refill   atropine 1 % ophthalmic solution Place 1 drop into both eyes daily.      bimatoprost (LUMIGAN) 0.03 % ophthalmic solution Place 1 drop into both eyes at bedtime.     Bromfenac Sodium (PROLENSA) 0.07 % SOLN Apply 1  drop to eye 2 (two) times daily.      cefpodoxime (VANTIN) 200 MG tablet Take 1 tablet (200 mg total) by mouth 2 (two) times daily. 4 tablet 0   dorzolamide-timolol (COSOPT) 22.3-6.8 MG/ML ophthalmic solution Place 1 drop into both eyes 2 (two) times daily.     fluorometholone (FML) 0.1 % ophthalmic suspension Place 1 drop into both eyes 2 (two) times daily.   5   LORazepam (ATIVAN) 0.5 MG tablet Take one tablet 30-60 minutes before procedure. If needed may take another one prior or during procedure. May cause sedation. Otherwise can take once every 8 hours for anxiety.  30 tablet 0   OVER THE COUNTER MEDICATION Take 1 tablet by mouth 2 (two) times daily. *L[place] Mane*     traZODone (DESYREL) 150 MG tablet Take 150 mg by mouth at bedtime.     No facility-administered medications prior to visit.     PAST MEDICAL HISTORY: Past Medical History:  Diagnosis Date   Dementia ([place])    Hypertension    UTI (urinary tract infection) 03/03/2018    PAST SURGICAL HISTORY: Past Surgical History:  Procedure Laterality Date   APPENDECTOMY     TONSILLECTOMY     TUBAL LIGATION      FAMILY HISTORY: Family History  Problem Relation Age of Onset   Dementia Mother     SOCIAL HISTORY: Social History   Socioeconomic History   Marital status: Married    Spouse name: [name]   Number of children: 3   Years of education: 12   Highest education level: Not on file  Occupational History   Not on file  Social Needs   Financial resource strain: Not on file   Food insecurity    Worry: Not on file    Inability: Not on file   Transportation needs    Medical: Not on file    Non-medical: Not on file  Tobacco Use   Smoking status: Never Smoker   Smokeless tobacco: Never Used  Substance and Sexual Activity   Alcohol use: No   Drug use: No   Sexual activity: Not on file  Lifestyle   Physical activity    Days per week: Not on file    Minutes per session: Not on file    Stress: Not on file  Relationships   Social connections    Talks on phone: Not on file    Gets together: Not on file    Attends religious service: Not on file    Active member of club or organization: Not on file    Attends meetings of clubs or organizations: Not on file    Relationship status: Not on file   Intimate partner violence    Fear of current or ex partner: Not on file    Emotionally abused: Not on file    Physically abused: Not on file    Forced sexual activity: Not on file  Other Topics Concern   Not on file  Social History Narrative   Lives at home with husband, [name].   Right handed   Drinks green tea      PHYSICAL EXAM  There were no vitals filed for this visit. There is no height or weight on file to calculate BMI.  Generalized: Well developed, in no acute distress  Cardiology: normal rate and rhythm, no murmur noted Neurological examination  Mentation: Alert oriented to time, place, history taking. Follows all commands speech and language fluent Cranial nerve II-XII: Pupils were equal round reactive to light. Extraocular movements were full, visual field were full on confrontational test. Facial sensation and strength were normal. Uvula tongue midline. Head turning and shoulder shrug  were normal and symmetric. Motor: The motor testing reveals 5 over 5 strength of all 4 extremities. Good symmetric motor tone is noted throughout.  Sensory: Sensory testing is intact to soft touch on all 4 extremities. No evidence of extinction is noted.  Coordination: Cerebellar testing reveals good finger-nose-finger and heel-to-shin bilaterally.  Gait and station: Gait is normal. Tandem gait is normal. Romberg is negative. No drift is seen.  Reflexes: Deep tendon reflexes are symmetric and  normal bilaterally.   DIAGNOSTIC DATA (LABS, IMAGING, TESTING) - I reviewed patient records, labs, notes, testing and imaging myself where available.  MMSE - Mini Mental State Exam  10/06/2017 05/25/2016 07/24/2015  Orientation to time 0 1 0  Orientation to Place 2 4 5   Registration 3 3 3   Attention/ Calculation 0 1 5  Recall 0 1 0  Language- name 2 objects 1 2 2   Language- repeat 0 1 1  Language- follow 3 step command 2 3 3   Language- read & follow direction 0 1 1  Write a sentence 0 0 1  Copy design 0 0 0  Total score 8 17 21      Lab Results  Component Value Date   WBC 6.9 09/01/2018   HGB 10.1 (L) 09/01/2018   HCT 30.6 (L) 09/01/2018   MCV 95.3 09/01/2018   PLT 87 (L) 09/01/2018      Component Value Date/Time   NA 145 09/02/2018 0821   NA 145 (H) 01/15/2015 0847   K 3.3 (L) 09/02/2018 0821   CL 125 (H) 09/02/2018 0821   CO2 14 (L) 09/02/2018 0821   GLUCOSE 89 09/02/2018 0821   BUN 16 09/02/2018 0821   BUN 6 (L) 01/15/2015 0847   CREATININE 0.93 09/02/2018 0821   CALCIUM 8.0 (L) 09/02/2018 0821   PROT 7.3 08/28/2018 1410   PROT 6.8 01/15/2015 0847   ALBUMIN 3.3 (L) 08/28/2018 1410   ALBUMIN 3.9 01/15/2015 0847   AST 84 (H) 08/28/2018 1410   ALT 74 (H) 08/28/2018 1410   ALKPHOS 79 08/28/2018 1410   BILITOT 0.7 08/28/2018 1410   BILITOT 0.6 01/15/2015 0847   GFRNONAA >60 09/02/2018 0821   GFRAA >60 09/02/2018 0821   No results found for: CHOL, HDL, LDLCALC, LDLDIRECT, TRIG, CHOLHDL No results found for: HGBA1C Lab Results  Component Value Date   VITAMINB12 589 01/24/2015   Lab Results  Component Value Date   TSH 3.076 01/24/2015       ASSESSMENT AND PLAN 73 y.o. year old female  has a past medical history of Dementia ([place]), Hypertension, and UTI (urinary tract infection) (03/03/2018). here with ***  No diagnosis found.     No orders of the defined types were placed in this encounter.    No orders of the defined types were placed in this encounter.     I spent 15 minutes with the patient. 50% of this time was spent counseling and educating patient on plan of care and medications.    Debbora Presto, FNP-C 02/21/2019, 4:20  PM [facility] [address], [place], [place] [address] [phone]

## 2019-02-22 ENCOUNTER — Encounter: Payer: Self-pay | Admitting: Family Medicine

## 2019-02-22 ENCOUNTER — Ambulatory Visit: Payer: Self-pay | Admitting: Family Medicine

## 2019-03-02 ENCOUNTER — Other Ambulatory Visit: Payer: Self-pay

## 2019-03-02 ENCOUNTER — Ambulatory Visit (INDEPENDENT_AMBULATORY_CARE_PROVIDER_SITE_OTHER): Payer: Medicare Other | Admitting: Family Medicine

## 2019-03-02 ENCOUNTER — Encounter: Payer: Self-pay | Admitting: Family Medicine

## 2019-03-02 VITALS — BP 106/82 | HR 86 | Temp 97.1°F | Ht 65.0 in | Wt 138.4 lb

## 2019-03-02 DIAGNOSIS — F0280 Dementia in other diseases classified elsewhere without behavioral disturbance: Secondary | ICD-10-CM | POA: Diagnosis not present

## 2019-03-02 DIAGNOSIS — G3183 Dementia with Lewy bodies: Secondary | ICD-10-CM | POA: Diagnosis not present
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
## 2019-03-02 NOTE — Patient Instructions (Signed)
We will continue current plan  Follow up in 6 months, sooner if needed   Lewy Body Dementia Lewy body dementia, also called dementia with Lewy bodies, is a condition that affects the way the brain functions. It is one form of dementia. In this condition, proteins called Lewy bodies build up in certain areas of the brain. This causes problems with:  Memory.  Decision making.  Behavior.  Speaking.  Thinking.  Movements and balance.  Problem solving. This condition is progressive, which means that it gets worse with time (is degenerative) and cannot be reversed. What are the causes? This condition is caused by the buildup of Lewy bodies in brain cells in areas of the brain that control memory, thinking, and movement. It is not known what causes the Lewy bodies to build up. What increases the risk? You are more likely to develop this condition if you:  Have a family history of Lewy body dementia or Parkinson's disease.  Are 72 years old or older.  Are female. What are the signs or symptoms? Symptoms of this condition may include:  Symptoms of dementia, such as: ? Trouble with memory. ? Trouble paying attention. ? Problems with planning and organizing. ? Problems with judgment. ? Behavioral problems.  Symptoms of Parkinson's disease, such as: ? Shaking movements that you cannot control (tremor). Tremors usually start in a hand or foot when you are resting (resting tremor). ? Stooped posture. ? Slowing of movement. ? Stiff muscles (rigidity). ? Loss of balance and stability when standing.  Seeing things that are not there (hallucinating).  Changes in memory, attention, and concentration that come and go (fluctuation).  Sleep problems, such as acting out dreams while you are asleep. How is this diagnosed? This condition is diagnosed by a specialist who diagnoses and treats this condition (neurologist). Your health care provider will talk with you and your family, friends,  or caregivers about your history and symptoms. A thorough medical history will be taken, and you will have a physical exam and tests. Tests may include:  Lab tests, such as blood or urine tests.  Imaging tests, such as a CT scan, a PET scan, or an MRI.  A test that involves removing and testing a small amount of the fluid that surrounds the brain and spinal cord (lumbar puncture).  A test where small metal discs are used to measure electrical activity in the brain (electroencephalogram or EEG).  Tests that evaluate brain function, such as memory tests, cognitive tests, and neuropsychological tests. How is this treated? There is no cure for this condition. Treatment focuses on managing your symptoms. Treatment may include:  Medicines. Everyone responds to medicines differently. Your response may change over time. Work with your health care provider to find the best medicines for you.  Speech, occupational, and physical therapy. Your health care provider can help direct you to support groups, organizations, and other health care providers who can help with decisions about your care. Follow these instructions at home: Medicine  Take over-the-counter and prescription medicines only as told by your health care provider.  To help you manage your medicines, use a pill organizer or pill reminder.  Avoid taking medicines that can affect thinking, such as pain medicines or sleeping medicines. Lifestyle  Make healthy lifestyle choices: ? Be physically active as told by your health care provider. ? Do not use any products that contain nicotine or tobacco, such as cigarettes, e-cigarettes, and chewing tobacco. If you need help quitting, ask your health  care provider. ? Try to practice stress-management techniques when you experience stress, such as mindfulness, yoga, or deep breathing. ? Stay socially connected. Talk regularly with other people, such as family, friends, and neighbors.  Make  sure you sleep well. These tips can help you get a good night's rest: ? Avoid napping during the day. ? Keep your sleeping area dark and cool. ? Avoid exercising a few hours before you go to bed. ? Avoid caffeine products in the evening. Eating and drinking  Do not drink alcohol.  Drink enough fluid to keep your urine pale yellow.  Eat a healthy diet. Safety      Work with your health care provider to determine what you need help with and what your safety needs are.  If you have trouble moving around, use a cane or walker as told by your health care provider.  Make sure your home environment is safe. To do this: ? Remove things that can be a tripping hazard, such as throw rugs or clutter. ? Install grab bars and railings in your home to prevent falls.  Talk with your health care provider about if it is safe for you to drive.  If you were given a bracelet that identifies you as a person with memory loss or tracks your location, make sure to wear it at all times. General instructions  Work with your family to make important decisions, such as advance directives, medical power of attorney, or a living will.  Keep all follow-up visits as told by your health care provider. This is important. Contact a health care provider if you have:  A fever.  Problems with choking or swallowing.  Any symptoms of a new or different illness.  New or worsening trouble with sleeping or increased daytime sleepiness.  New or worsening confusion. Get help right away if:  You feel depressed, sad, or feel that you want to harm yourself.  Your family members become concerned for your safety. If you ever feel like you may hurt yourself or others, or have thoughts about taking your own life, get help right away. You can go to your nearest emergency department or call:  Your local emergency services (911 in the [place]  A suicide crisis helpline, such as the [place]  at 770-817-2455. This is open 24 hours a day. Summary  Dementia is a condition that affects the way the brain functions. It often affects memory and thinking.  Lewy body dementia is a degenerative dementia.  This condition is caused by the buildup of proteins called Lewy bodies in brain cells. It is not known what causes the Lewy bodies to build up.  Work with your health care provider to determine what you need help with and what your safety needs are.  Your health care provider can help direct you to support groups, organizations, and other health care providers who can help with decisions about your care. This information is not intended to replace advice given to you by your health care provider. Make sure you discuss any questions you have with your health care provider. Document Released: 01/10/2002 Document Revised: 06/16/2018 Document Reviewed: 06/16/2018 Elsevier Patient Education  2020 [employer].

## 2019-06-23 DIAGNOSIS — I10 Essential (primary) hypertension: Secondary | ICD-10-CM | POA: Diagnosis not present

## 2019-06-27 DIAGNOSIS — M1990 Unspecified osteoarthritis, unspecified site: Secondary | ICD-10-CM | POA: Diagnosis not present

## 2019-06-27 DIAGNOSIS — F0281 Dementia in other diseases classified elsewhere with behavioral disturbance: Secondary | ICD-10-CM | POA: Diagnosis not present

## 2019-06-27 DIAGNOSIS — I10 Essential (primary) hypertension: Secondary | ICD-10-CM | POA: Diagnosis not present

## 2019-06-30 DIAGNOSIS — I10 Essential (primary) hypertension: Secondary | ICD-10-CM | POA: Diagnosis not present

## 2019-07-19 DIAGNOSIS — F0281 Dementia in other diseases classified elsewhere with behavioral disturbance: Secondary | ICD-10-CM | POA: Diagnosis not present

## 2019-07-19 DIAGNOSIS — I10 Essential (primary) hypertension: Secondary | ICD-10-CM | POA: Diagnosis not present

## 2019-07-19 DIAGNOSIS — M1990 Unspecified osteoarthritis, unspecified site: Secondary | ICD-10-CM | POA: Diagnosis not present

## 2019-08-02 DIAGNOSIS — I10 Essential (primary) hypertension: Secondary | ICD-10-CM | POA: Diagnosis not present

## 2019-08-30 NOTE — Progress Notes (Deleted)
PATIENT: Tanya Jensen DOB: 21-Jun-1945  REASON FOR VISIT: follow up HISTORY FROM: patient  No chief complaint on file.    HISTORY OF PRESENT ILLNESS: Today 08/30/19 Tanya Jensen is a 74 y.o. female here today for follow up.   HISTORY: (copied from my note on 03/02/2019)  Tanya Jensen is a 74 y.o. female here today for follow up for dementia.  She presents today with her husband who provides HPI.  He feels that overall Tanya Jensen is doing well.  He states that she seems happy.  She stands all the time.  She does continue to show decline with memory.  Physically she seems to be doing well.  L[place] seems to be helping. She is taking Leveta OTC. He feels that she may be a little more focused.  He does report hospitalization in April for a urinary tract infection.  He states that she was very sick.  She required physical therapy following discharge.  He feels that she has recovered well.  He does assist her with all ADLs.  He does think that she continues to have hallucinations, however, he does not feel that these are frightening.  She typically reports seeing a baby in the home.  She has not been able to tolerate memory for antipsychotic medications in the past.  She is eating and drinking normally.  Weight has been stable.  He does have someone helping him on Saturday so that he may leave to complete errands.  He has limited social support.  He does have support with the church community.  He has 3 stepchildren who are available if needed but do not live locally.   REVIEW OF SYSTEMS: Out of a complete 14 system review of symptoms, the patient complains only of the following symptoms, and all other reviewed systems are negative.  ALLERGIES: No Known Allergies  HOME MEDICATIONS: Outpatient Medications Prior to Visit  Medication Sig Dispense Refill  . atropine 1 % ophthalmic solution Place 1 drop into both eyes daily.     . bimatoprost (LUMIGAN) 0.03 % ophthalmic solution Place 1  drop into both eyes at bedtime.    . Bromfenac Sodium (PROLENSA) 0.07 % SOLN Apply 1 drop to eye 2 (two) times daily.     . cefpodoxime (VANTIN) 200 MG tablet Take 1 tablet (200 mg total) by mouth 2 (two) times daily. (Patient not taking: Reported on 03/02/2019) 4 tablet 0  . dorzolamide-timolol (COSOPT) 22.3-6.8 MG/ML ophthalmic solution Place 1 drop into both eyes 2 (two) times daily.    . fluorometholone (FML) 0.1 % ophthalmic suspension Place 1 drop into both eyes 2 (two) times daily.   5  . latanoprost (XALATAN) 0.005 % ophthalmic solution Place 1 drop into both eyes at bedtime.    Marland Kitchen LINZESS 145 MCG CAPS capsule Take 145 mcg by mouth daily as needed.    Marland Kitchen LORazepam (ATIVAN) 0.5 MG tablet Take one tablet 30-60 minutes before procedure. If needed may take another one prior or during procedure. May cause sedation. Otherwise can take once every 8 hours for anxiety. (Patient not taking: Reported on 03/02/2019) 30 tablet 0  . OVER THE COUNTER MEDICATION Take 1 tablet by mouth daily. [facility]*     . Polyethyl Glycol-Propyl Glycol (SYSTANE OP) Apply 1 drop to eye 4 (four) times daily.    . Prenatal Vit-DSS-Fe Cbn-FA (PRENATAL AD PO) Take 1 tablet by mouth daily.    . timolol (BETIMOL) 0.5 % ophthalmic solution Place  1 drop into both eyes 2 (two) times daily.    . traZODone (DESYREL) 150 MG tablet Take 150 mg by mouth at bedtime.    Marland Kitchen UNABLE TO FIND Take 1 tablet by mouth daily. Med Name: Elzie Rings     No facility-administered medications prior to visit.    PAST MEDICAL HISTORY: Past Medical History:  Diagnosis Date  . Dementia ([place])   . Hypertension   . UTI (urinary tract infection) 03/03/2018    PAST SURGICAL HISTORY: Past Surgical History:  Procedure Laterality Date  . APPENDECTOMY    . TONSILLECTOMY    . TUBAL LIGATION      FAMILY HISTORY: Family History  Problem Relation Age of Onset  . Dementia Mother     SOCIAL HISTORY: Social History   Socioeconomic History  . Marital  status: Married    Spouse name: [name]  . Number of children: 3  . Years of education: 6  . Highest education level: Not on file  Occupational History  . Not on file  Tobacco Use  . Smoking status: Never Smoker  . Smokeless tobacco: Never Used  Substance and Sexual Activity  . Alcohol use: No  . Drug use: No  . Sexual activity: Not on file  Other Topics Concern  . Not on file  Social History Narrative   Lives at home with husband, [name].   Right handed   Drinks green tea   Social Determinants of Health   Financial Resource Strain:   . Difficulty of Paying Living Expenses:   Food Insecurity:   . Worried About Charity fundraiser in the Last Year:   . Arboriculturist in the Last Year:   Transportation Needs:   . Film/video editor (Medical):   Marland Kitchen Lack of Transportation (Non-Medical):   Physical Activity:   . Days of Exercise per Week:   . Minutes of Exercise per Session:   Stress:   . Feeling of Stress :   Social Connections:   . Frequency of Communication with Friends and Family:   . Frequency of Social Gatherings with Friends and Family:   . Attends Religious Services:   . Active Member of Clubs or Organizations:   . Attends Archivist Meetings:   Marland Kitchen Marital Status:   Intimate Partner Violence:   . Fear of Current or Ex-Partner:   . Emotionally Abused:   Marland Kitchen Physically Abused:   . Sexually Abused:       PHYSICAL EXAM  There were no vitals filed for this visit. There is no height or weight on file to calculate BMI.  Generalized: Well developed, in no acute distress  Cardiology: normal rate and rhythm, no murmur noted Respiratory: clear to auscultation bilaterally  Neurological examination  Mentation: Alert oriented to time, place, history taking. Follows all commands speech and language fluent Cranial nerve II-XII: Pupils were equal round reactive to light. Extraocular movements were full, visual field were full on confrontational test. Facial  sensation and strength were normal. Uvula tongue midline. Head turning and shoulder shrug  were normal and symmetric. Motor: The motor testing reveals 5 over 5 strength of all 4 extremities. Good symmetric motor tone is noted throughout.  Sensory: Sensory testing is intact to soft touch on all 4 extremities. No evidence of extinction is noted.  Coordination: Cerebellar testing reveals good finger-nose-finger and heel-to-shin bilaterally.  Gait and station: Gait is normal. Tandem gait is normal. Romberg is negative. No drift is seen.  Reflexes: Deep  tendon reflexes are symmetric and normal bilaterally.   DIAGNOSTIC DATA (LABS, IMAGING, TESTING) - I reviewed patient records, labs, notes, testing and imaging myself where available.  MMSE - Mini Mental State Exam 10/06/2017 05/25/2016 07/24/2015  Orientation to time 0 1 0  Orientation to Place 2 4 5   Registration 3 3 3   Attention/ Calculation 0 1 5  Recall 0 1 0  Language- name 2 objects 1 2 2   Language- repeat 0 1 1  Language- follow 3 step command 2 3 3   Language- read & follow direction 0 1 1  Write a sentence 0 0 1  Copy design 0 0 0  Total score 8 17 21      Lab Results  Component Value Date   WBC 6.9 09/01/2018   HGB 10.1 (L) 09/01/2018   HCT 30.6 (L) 09/01/2018   MCV 95.3 09/01/2018   PLT 87 (L) 09/01/2018      Component Value Date/Time   NA 145 09/02/2018 0821   NA 145 (H) 01/15/2015 0847   K 3.3 (L) 09/02/2018 0821   CL 125 (H) 09/02/2018 0821   CO2 14 (L) 09/02/2018 0821   GLUCOSE 89 09/02/2018 0821   BUN 16 09/02/2018 0821   BUN 6 (L) 01/15/2015 0847   CREATININE 0.93 09/02/2018 0821   CALCIUM 8.0 (L) 09/02/2018 0821   PROT 7.3 08/28/2018 1410   PROT 6.8 01/15/2015 0847   ALBUMIN 3.3 (L) 08/28/2018 1410   ALBUMIN 3.9 01/15/2015 0847   AST 84 (H) 08/28/2018 1410   ALT 74 (H) 08/28/2018 1410   ALKPHOS 79 08/28/2018 1410   BILITOT 0.7 08/28/2018 1410   BILITOT 0.6 01/15/2015 0847   GFRNONAA >60 09/02/2018 0821    GFRAA >60 09/02/2018 0821   No results found for: CHOL, HDL, LDLCALC, LDLDIRECT, TRIG, CHOLHDL No results found for: HGBA1C Lab Results  Component Value Date   VITAMINB12 589 01/24/2015   Lab Results  Component Value Date   TSH 3.076 01/24/2015       ASSESSMENT AND PLAN 74 y.o. year old female  has a past medical history of Dementia ([place]), Hypertension, and UTI (urinary tract infection) (03/03/2018). here with ***  No diagnosis found.     No orders of the defined types were placed in this encounter.    No orders of the defined types were placed in this encounter.     I spent 15 minutes with the patient. 50% of this time was spent counseling and educating patient on plan of care and medications.    Debbora Presto, FNP-C 08/30/2019, 4:33 PM [facility] [address] [place], [place] [address] [phone]

## 2019-08-31 ENCOUNTER — Ambulatory Visit: Payer: Medicare Other | Admitting: Family Medicine

## 2019-09-01 DIAGNOSIS — F0281 Dementia in other diseases classified elsewhere with behavioral disturbance: Secondary | ICD-10-CM | POA: Diagnosis not present

## 2019-09-01 DIAGNOSIS — M1990 Unspecified osteoarthritis, unspecified site: Secondary | ICD-10-CM | POA: Diagnosis not present

## 2019-09-01 DIAGNOSIS — I10 Essential (primary) hypertension: Secondary | ICD-10-CM | POA: Diagnosis not present

## 2019-09-11 ENCOUNTER — Telehealth: Payer: Self-pay | Admitting: Family Medicine

## 2019-09-11 NOTE — Telephone Encounter (Signed)
Patient husband called to request a letter from MD allowing him to have his mailbox moved near the house as pt it not able to physically move around. Pt husband is unsure if this is something that can be done. States the deadline is [place]

## 2019-09-12 ENCOUNTER — Encounter: Payer: Self-pay | Admitting: *Deleted

## 2019-09-12 NOTE — Telephone Encounter (Signed)
Spoke to husband.  He states that he would like the letter to go to [employer].  This would be stating that she has dementia and is hardship and safety issue  for her to retrieve mail at the curb. Will pick up and needs by tomorrow.

## 2019-09-12 NOTE — Telephone Encounter (Signed)
Amy, is this something you can provide?

## 2019-09-12 NOTE — Telephone Encounter (Signed)
Sounds great. TY!

## 2019-09-12 NOTE — Telephone Encounter (Signed)
LMVM for husband to return call. I did relay the message in the VM for him as well.

## 2019-09-12 NOTE — Telephone Encounter (Signed)
Note for signature.

## 2019-09-12 NOTE — Telephone Encounter (Signed)
I would be ok providing a letter stating patient has dementia and requires assistance with ADL's. I am uncertain to whom it needs to be addressed and if they need any additional information. It seems that she is due for follow up if I am not mistaken. TY!

## 2019-09-12 NOTE — Telephone Encounter (Signed)
Pt husband called and message from [place], [place] was read to him.

## 2019-09-12 NOTE — Telephone Encounter (Signed)
LMVM for husband that letter is ready and placed up front.

## 2019-09-18 DIAGNOSIS — I10 Essential (primary) hypertension: Secondary | ICD-10-CM | POA: Diagnosis not present

## 2019-09-18 DIAGNOSIS — F0281 Dementia in other diseases classified elsewhere with behavioral disturbance: Secondary | ICD-10-CM | POA: Diagnosis not present

## 2019-09-18 DIAGNOSIS — M1990 Unspecified osteoarthritis, unspecified site: Secondary | ICD-10-CM | POA: Diagnosis not present

## 2019-09-28 DIAGNOSIS — H43813 Vitreous degeneration, bilateral: Secondary | ICD-10-CM | POA: Diagnosis not present

## 2019-09-28 DIAGNOSIS — H2013 Chronic iridocyclitis, bilateral: Secondary | ICD-10-CM | POA: Diagnosis not present

## 2019-09-28 DIAGNOSIS — H35353 Cystoid macular degeneration, bilateral: Secondary | ICD-10-CM | POA: Diagnosis not present

## 2019-09-28 DIAGNOSIS — H3581 Retinal edema: Secondary | ICD-10-CM | POA: Diagnosis not present

## 2019-09-28 DIAGNOSIS — H35373 Puckering of macula, bilateral: Secondary | ICD-10-CM | POA: Diagnosis not present

## 2019-10-26 ENCOUNTER — Ambulatory Visit: Payer: Medicare HMO | Admitting: Family Medicine

## 2019-10-30 DIAGNOSIS — I10 Essential (primary) hypertension: Secondary | ICD-10-CM | POA: Diagnosis not present

## 2019-10-30 DIAGNOSIS — M1990 Unspecified osteoarthritis, unspecified site: Secondary | ICD-10-CM | POA: Diagnosis not present

## 2019-10-30 DIAGNOSIS — F0281 Dementia in other diseases classified elsewhere with behavioral disturbance: Secondary | ICD-10-CM | POA: Diagnosis not present

## 2019-11-29 DIAGNOSIS — F0281 Dementia in other diseases classified elsewhere with behavioral disturbance: Secondary | ICD-10-CM | POA: Diagnosis not present

## 2019-11-29 DIAGNOSIS — I10 Essential (primary) hypertension: Secondary | ICD-10-CM | POA: Diagnosis not present

## 2019-11-29 DIAGNOSIS — M1990 Unspecified osteoarthritis, unspecified site: Secondary | ICD-10-CM | POA: Diagnosis not present

## 2019-11-30 DIAGNOSIS — D869 Sarcoidosis, unspecified: Secondary | ICD-10-CM | POA: Diagnosis not present

## 2019-11-30 DIAGNOSIS — K5904 Chronic idiopathic constipation: Secondary | ICD-10-CM | POA: Diagnosis not present

## 2019-11-30 DIAGNOSIS — F5101 Primary insomnia: Secondary | ICD-10-CM | POA: Diagnosis not present

## 2019-11-30 DIAGNOSIS — G3183 Dementia with Lewy bodies: Secondary | ICD-10-CM | POA: Diagnosis not present

## 2019-11-30 DIAGNOSIS — I10 Essential (primary) hypertension: Secondary | ICD-10-CM | POA: Diagnosis not present

## 2019-11-30 DIAGNOSIS — R2241 Localized swelling, mass and lump, right lower limb: Secondary | ICD-10-CM | POA: Diagnosis not present

## 2019-12-12 ENCOUNTER — Ambulatory Visit: Payer: Medicare HMO | Admitting: Family Medicine

## 2020-03-07 ENCOUNTER — Ambulatory Visit: Payer: Medicare HMO | Admitting: Family Medicine

## 2020-03-07 ENCOUNTER — Encounter: Payer: Self-pay | Admitting: Family Medicine

## 2020-03-07 VITALS — Ht 68.0 in

## 2020-03-07 DIAGNOSIS — G3183 Dementia with Lewy bodies: Secondary | ICD-10-CM | POA: Diagnosis not present

## 2020-03-07 DIAGNOSIS — F0280 Dementia in other diseases classified elsewhere without behavioral disturbance: Secondary | ICD-10-CM

## 2020-03-07 NOTE — Progress Notes (Addendum)
Chief Complaint  Patient presents with  . Follow-up    rm 6  . Dementia    pt here for f/u on Dementia. Pt is here with her husband  . Behavior Disturbance     HISTORY OF PRESENT ILLNESS: Today 03/07/20  Tanya Jensen is a 74 y.o. female here today for follow up for LBD. She continues to do very well. She continues to seem happy and without any unwanted behavioral changes. She sings all the time. She is eating well. Stays well hydrated. She has a caregiver every day while her husband works part time. He feels he is doing well and has a great support system. She continues OTC supplements. Leveta, Lion's Mane, Black seed and coconut oil.   She is fully vaccinated and had booster.   HISTORY (copied from my note on 03/02/2019)  Tanya Jensen is a 74 y.o. female here today for follow up for dementia.  She presents today with her husband who provides HPI.  He feels that overall Tanya Jensen is doing well.  He states that she seems happy.  She stands all the time.  She does continue to show decline with memory.  Physically she seems to be doing well.  Lion's Mane seems to be helping. She is taking Leveta OTC. He feels that she may be a little more focused.  He does report hospitalization in April for a urinary tract infection.  He states that she was very sick.  She required physical therapy following discharge.  He feels that she has recovered well.  He does assist her with all ADLs.  He does think that she continues to have hallucinations, however, he does not feel that these are frightening.  She typically reports seeing a baby in the home.  She has not been able to tolerate memory for antipsychotic medications in the past.  She is eating and drinking normally.  Weight has been stable.  He does have someone helping him on Saturday so that he may leave to complete errands.  He has limited social support.  He does have support with the church community.  He has 3 stepchildren who are available if  needed but do not live locally.  HISTORY: (copied from Dr Cathren Laine note on 04/07/2018)  Interval history: Patient here for follow-up of dementia. She has been doing well, hallucinations do not seem to be a problem anymore, she continues to decline, did not tolerate Aricept or Namenda, her sister is here and she drove 4 hours from [place]. Also here with her husband. She has been having increased weight loss yet she is eating quite well. Today we will check labs such as thyroid. We will also check other dementia labs again today. Discussed computers down they will come back to have the labs drawn. Unclear etiology of significant weight loss. She otherwise appears quite healthy however.  Interval history 10/06/2017: Here for follow up on Lewy Body Dementia. Significant decline today MMSE 8/30. The family feels she is better. She is on Trazodone at night prescribed by pcp for sleep and also helps with mood. She takes Risperdal for hallucinations/delusions. Her BP is low today and she is on Clonidine however asymptomatic. She has been trying to lose weight. We will stop the Risperdal. He gives it to her every other day, will stop doesn't appear she needs it if every other day. No symptoms of hypotension, she has lost 55 pounds, she gets agitated. Discussed risks especially sedation, falls, addiction. Discussed  Nuplazid, in the future can continue. Hallucinations improved after stopping steroids. Sister here for the first time, reviewed history. Her sister [name] is from [place] and they see eachother every month.   Interval history 01/06/2017:Hallucinations worsened with prednisone and improved since stopping. Doing well on the Risperidone. She has lost weight. No wandering, no calls to police. Will try Exelon again twice a day, stop for any side effects. She is restless, may be a side effect of the Risperidal. Since adjusting medications due to low blood pressure she has not fallen, her blood pressure  was very low and she was dizzy and doing better, no difficulty swallowing, some increased movements and balling up of her clothes with her hands, improved agitation. They have a caretaker that stays with her during the day. She walks around the house a lot, no wandering or calls to 911. She has lost about 40 pounds. No sleeping changes, they also have a sleeping aid to help at night.   Interval history 05/25/2016: Tanya Jensen is a 73 year old female with Lewy body dementia. She returns today for follow-up. The patient has tried Aricept, Namenda and Exelon but has not been able to tolerate the medication. She is on Risperdal 2mg  twice daily for hallucinations and agitation. She lives with her husband. She is crying today because she could not remember the date on the MMSE. She notices her memory loss and it is frustrating for her. Husband is using 1/2 a pill twice daily unless needed for episodes of hallucinations and agitation. Husband is home during the day and someone sits with patient when he is at work. Her hallucinations have been under good control. He has noticed prednisone can trigger hallucinations in her. She goes to bed at 7pm, she takes Trazodone at night and she sleeps all night. No falls. No dysphagia. She takes vitamin D and a daily multivitamin. She does not cook, husband sets out her clothes but she can dress herself, she helps fold clothes, showers and toilets herself. Some confusion in the late afternoon when she says she wants to go home but is actually already at home. She plays the piano at home with assistance from husband, he is exercising her brain.  Interval update 11/27/2015:She is doing very well. She is on Risperdal 2mg  twice daily and we discussed the risks associated with these medications. He has decreased to 1 pill in the morning 2mg  and discussed trying to limit as much as possible. He may give her 1/2 a pill in the afternoon if needed. She is not having any side effects to the  Rispersdal. It has helped with the delusions and hallucinations. She has not left the house recently. No falls. She has some back pain and she is on a muscle relaxer. Sleeping well, no issues at night. She takes 1/2 a trazodone at night. Husband and wife is happy. There is a book, 36-hour day. She feels like she is doing very well. Mother and grandmother had dementia. Discussed Lewy body dementia. She was a Theme park manager, [employer].   HISTORY OF PRESENT ILLNESS Tanya Jensen is a 74 year old female with Lewy body dementia. She returns today for follow-up. The patient has tried Aricept, Namenda and Exelon but has not been able to tolerate the medication. Her husband reports that she is having hallucinations again. Her hallucinations consist of seeing people in her home. The hallucinations are not violent or fearful. She will occasionally has some delusional thinking such as feeling as if her jewelry has been  stolen. She also states to me when her husband is out of the room-she feels that he is trying to divorce her. The patient and her husband feel that her memory has remained stable. She is able to complete all ADLs independently. She is currently not operating a motor vehicle primarily due to her vision. The patient is having trouble sleeping at night and her primary care started her on trazodone 50 mg at bedtime. She is been taking this for 1 week but has not seen any benefit yet. She returns today for an evaluation.  interval update 06/12/2015: She had side effects to donepezil. She became more confused and talking in sleep. Stopping it helped. Could not tolerate the namzeric either, more confusion. Will start Rivastigmine which is better studied in lewy body dementia. No more problems with leaving the house. She is doing well, patient and husband deny hallucinations since last being see, no swallowing difficulties, no falls, no accidents. I recommend patient not drive anymore. Husband is with patient all  the time except when at work and patient is at home and doing well. She does go out a lot but she has friends come pick her up and take her out. Her appetite has been good, eatong a good diet, getting out and walking, mood is good. Everything has been going well. No falls. No issues since last being seen, husband and patient endorse all is well. She is comfortably staying at her home alone as well. No delusions, no recent hallucinations.   Interval update 03/12/2015: Wife says things are fine. No incidents since the Silver Alert in September. She left in the car and was stopped by a state trooper. She is still having visual hallucinations. She also has illusions and delusions. But not like she was having before. B12 wnl. TSH wnl.   HPI: Tanya Jensen is a 74 y.o. female here as a referral from Dr. Kenton Kingfisher for forgetfullness. PMHx HTN. She says she drives, she never gets lost. She was hallucinating. She has memory loss. She thought someone was in the home. Multiple times. She see people in church. She saw cowgirls one day. Daughter and husband are here. Daughter brought her to church and her mom hugged her and didn't remember her. Aroiund the age of 19 she started repeating things. Noticed it 8 years ago. Getting worse. Kids had a meeting with the husband and wanted a workup completed. More recent memory than remote. She is more combative. She is changing a little bit. She has glaucoma. Vision is worsening. She has a cataract developing on one eye. She was in bed one night, she was awakened . She saw a lady on the wall. She thought the headrest in the car were people. Last Thursday she thought she saw a white female in the bathroom. She is forgetting streets she has been on in the past frequently. She calls multiple people telling them she is seeing things, says people are waiting in the car. She has called multiple people to check the house. She called someone from church 25 times. No shuffling gait or tremors.  Patient's mother and grandmother had dementia, started around 80. She has lost her house and her car due to mismanagement of finances. They had to stay in a hotel when they lost everything. She is not bathing. She used to be very sharp, now she is forgetting she has rental property. Slowly progressive. She doesn't remember a lot of the incidents. She is getting lost. She should not  be driving anywhere.    Reviewed notes, labs and imaging from outside physicians, which showed:  CT of the head 03/2012:personally reviewed and agree with findings below  Comparison: None.  Findings: The ventricles are normal in size, for this patient's age, and normal in configuration.  here are no parenchymal masses or mass effect. There are no areas of abnormal parenchymal attenuation. There is no evidence of a recent infarct.  There are no extra-axial masses or abnormal fluid collections.  No intracranial hemorrhage.  Right maxillary sinus mucosal thickening. The remaining visualized sinuses and mastoid air cells are clear. No skull lesion/fracture.  IMPRESSION: No intracranial abnormality. Right maxillary sinus mucosal thickening.  TSH wnl, ACE <14,     REVIEW OF SYSTEMS: Out of a complete 14 system review of symptoms, the patient complains only of the following symptoms, memory loss and all other reviewed systems are negative.   ALLERGIES: No Known Allergies   HOME MEDICATIONS: Outpatient Medications Prior to Visit  Medication Sig Dispense Refill  . atropine 1 % ophthalmic solution Place 1 drop into both eyes daily.     . bimatoprost (LUMIGAN) 0.03 % ophthalmic solution Place 1 drop into both eyes at bedtime.    . Bromfenac Sodium (PROLENSA) 0.07 % SOLN Apply 1 drop to eye 2 (two) times daily.     . fluorometholone (FML) 0.1 % ophthalmic suspension Place 1 drop into both eyes 2 (two) times daily.   5  . latanoprost (XALATAN) 0.005 % ophthalmic solution Place 1 drop into both  eyes at bedtime.    Marland Kitchen LINZESS 145 MCG CAPS capsule Take 145 mcg by mouth daily as needed.    Marland Kitchen LORazepam (ATIVAN) 0.5 MG tablet Take one tablet 30-60 minutes before procedure. If needed may take another one prior or during procedure. May cause sedation. Otherwise can take once every 8 hours for anxiety. 30 tablet 0  . OVER THE COUNTER MEDICATION Take 1 tablet by mouth daily. [facility]*     . timolol (BETIMOL) 0.5 % ophthalmic solution Place 1 drop into both eyes 2 (two) times daily.    . traZODone (DESYREL) 150 MG tablet Take 150 mg by mouth at bedtime.    Marland Kitchen UNABLE TO FIND Take 1 tablet by mouth daily. Med Name: Neuriva    . cefpodoxime (VANTIN) 200 MG tablet Take 1 tablet (200 mg total) by mouth 2 (two) times daily. (Patient not taking: Reported on 03/02/2019) 4 tablet 0  . dorzolamide-timolol (COSOPT) 22.3-6.8 MG/ML ophthalmic solution Place 1 drop into both eyes 2 (two) times daily.    Vladimir Faster Glycol-Propyl Glycol (SYSTANE OP) Apply 1 drop to eye 4 (four) times daily.    . Prenatal Vit-DSS-Fe Cbn-FA (PRENATAL AD PO) Take 1 tablet by mouth daily.     No facility-administered medications prior to visit.     PAST MEDICAL HISTORY: Past Medical History:  Diagnosis Date  . Dementia ([place])   . Hypertension   . UTI (urinary tract infection) 03/03/2018     PAST SURGICAL HISTORY: Past Surgical History:  Procedure Laterality Date  . APPENDECTOMY    . TONSILLECTOMY    . TUBAL LIGATION       FAMILY HISTORY: Family History  Problem Relation Age of Onset  . Dementia Mother      SOCIAL HISTORY: Social History   Socioeconomic History  . Marital status: Married    Spouse name: [name]  . Number of children: 3  . Years of education: 26  . Highest education  level: Not on file  Occupational History  . Not on file  Tobacco Use  . Smoking status: Never Smoker  . Smokeless tobacco: Never Used  Vaping Use  . Vaping Use: Never used  Substance and Sexual Activity  . Alcohol use:  No  . Drug use: No  . Sexual activity: Not on file  Other Topics Concern  . Not on file  Social History Narrative   Lives at home with husband, [name].   Right handed   Drinks green tea   Social Determinants of Health   Financial Resource Strain:   . Difficulty of Paying Living Expenses: Not on file  Food Insecurity:   . Worried About Charity fundraiser in the Last Year: Not on file  . Ran Out of Food in the Last Year: Not on file  Transportation Needs:   . Lack of Transportation (Medical): Not on file  . Lack of Transportation (Non-Medical): Not on file  Physical Activity:   . Days of Exercise per Week: Not on file  . Minutes of Exercise per Session: Not on file  Stress:   . Feeling of Stress : Not on file  Social Connections:   . Frequency of Communication with Friends and Family: Not on file  . Frequency of Social Gatherings with Friends and Family: Not on file  . Attends Religious Services: Not on file  . Active Member of Clubs or Organizations: Not on file  . Attends Archivist Meetings: Not on file  . Marital Status: Not on file  Intimate Partner Violence:   . Fear of Current or Ex-Partner: Not on file  . Emotionally Abused: Not on file  . Physically Abused: Not on file  . Sexually Abused: Not on file      PHYSICAL EXAM  Vitals:   03/07/20 1309  Height: 5\' 8"  (1.727 m)   Body mass index is 21.04 kg/m.   Generalized: Well developed, in no acute distress   Cardiology: Normal rate and rhythm, no murmur auscultated Respiratory, clear to auscultation bilaterally  Neurological examination  Mentation: Alert, not oriented to time, place, or history taking.  She does not follow commands.  She will seen when spoken to. Cranial nerve II-XII: Pupils were equal round reactive to light. Extraocular movements were full, visual field were full on confrontational test.  Sensory: Unable to assess Coordination: Unable to assess Gait and station: Gait is  normal. Tandem not attempted     DIAGNOSTIC DATA (LABS, IMAGING, TESTING) - I reviewed patient records, labs, notes, testing and imaging myself where available.  Lab Results  Component Value Date   WBC 6.9 09/01/2018   HGB 10.1 (L) 09/01/2018   HCT 30.6 (L) 09/01/2018   MCV 95.3 09/01/2018   PLT 87 (L) 09/01/2018      Component Value Date/Time   NA 145 09/02/2018 0821   NA 145 (H) 01/15/2015 0847   K 3.3 (L) 09/02/2018 0821   CL 125 (H) 09/02/2018 0821   CO2 14 (L) 09/02/2018 0821   GLUCOSE 89 09/02/2018 0821   BUN 16 09/02/2018 0821   BUN 6 (L) 01/15/2015 0847   CREATININE 0.93 09/02/2018 0821   CALCIUM 8.0 (L) 09/02/2018 0821   PROT 7.3 08/28/2018 1410   PROT 6.8 01/15/2015 0847   ALBUMIN 3.3 (L) 08/28/2018 1410   ALBUMIN 3.9 01/15/2015 0847   AST 84 (H) 08/28/2018 1410   ALT 74 (H) 08/28/2018 1410   ALKPHOS 79 08/28/2018 1410   BILITOT 0.7  08/28/2018 1410   BILITOT 0.6 01/15/2015 0847   GFRNONAA >60 09/02/2018 0821   GFRAA >60 09/02/2018 0821   No results found for: CHOL, HDL, LDLCALC, LDLDIRECT, TRIG, CHOLHDL No results found for: HGBA1C Lab Results  Component Value Date   PYKDXIPJ82 505 01/24/2015   Lab Results  Component Value Date   TSH 3.076 01/24/2015      ASSESSMENT AND PLAN  74 y.o. year old female  has a past medical history of Dementia ([place]), Hypertension, and UTI (urinary tract infection) (03/03/2018). here with   Lewy body dementia without behavioral disturbance ([place])  Denelle continues to do very well at home.  She is unable to participate in any meaningful conversation but continues to sing.  She appears happy.  No obvious physical deficits appreciated with today's exam.  Her husband seems well supported.  He was encouraged to continue current treatment plan.  He may follow-up with [place] as needed.  He verbalizes understanding and agreement with this plan.  I spent 20 minutes of face-to-face and non-face-to-face time with patient.  This  included previsit chart review, lab review, study review, order entry, electronic health record documentation, patient education.    Debbora Presto, MSN, FNP-C 03/07/2020, 1:19 PM  [facility] [address], [place],  [address] [phone]  Made any corrections needed, and agree with history, physical, neuro exam,assessment and plan as stated.     Sarina Ill, MD [facility]

## 2020-03-07 NOTE — Patient Instructions (Signed)
Below is our plan:  We will continue current treatment plan.   Please make sure you are staying well hydrated. I recommend 50-60 ounces daily. Well balanced diet and regular exercise encouraged.    Please continue follow up with care team as directed.   Follow up in 6-12 months   You may receive a survey regarding today's visit. I encourage you to leave honest feed back as I do use this information to improve patient care. Thank you for seeing me today!      Lewy Body Dementia Lewy body dementia, also called dementia with Lewy bodies, is a condition that affects the way the brain functions. It is one form of dementia. In this condition, proteins called Lewy bodies build up in certain areas of the brain. This causes problems with:  Memory.  Decision making.  Behavior.  Speaking.  Thinking.  Movements and balance.  Problem solving. This condition is progressive, which means that it gets worse with time (is degenerative) and cannot be reversed. What are the causes? This condition is caused by the buildup of Lewy bodies in brain cells in areas of the brain that control memory, thinking, and movement. It is not known what causes the Lewy bodies to build up. What increases the risk? You are more likely to develop this condition if you:  Have a family history of Lewy body dementia or Parkinson's disease.  Are 73 years old or older.  Are female. What are the signs or symptoms? Symptoms of this condition may include:  Symptoms of dementia, such as: ? Trouble with memory. ? Trouble paying attention. ? Problems with planning and organizing. ? Problems with judgment. ? Behavioral problems.  Symptoms of Parkinson's disease, such as: ? Shaking movements that you cannot control (tremor). Tremors usually start in a hand or foot when you are resting (resting tremor). ? Stooped posture. ? Slowing of movement. ? Stiff muscles (rigidity). ? Loss of balance and stability when  standing.  Seeing things that are not there (hallucinating).  Changes in memory, attention, and concentration that come and go (fluctuation).  Sleep problems, such as acting out dreams while you are asleep. How is this diagnosed? This condition is diagnosed by a specialist who diagnoses and treats this condition (neurologist). Your health care provider will talk with you and your family, friends, or caregivers about your history and symptoms. A thorough medical history will be taken, and you will have a physical exam and tests. Tests may include:  Lab tests, such as blood or urine tests.  Imaging tests, such as a CT scan, a PET scan, or an MRI.  A test that involves removing and testing a small amount of the fluid that surrounds the brain and spinal cord (lumbar puncture).  A test where small metal discs are used to measure electrical activity in the brain (electroencephalogram or EEG).  Tests that evaluate brain function, such as memory tests, cognitive tests, and neuropsychological tests. How is this treated? There is no cure for this condition. Treatment focuses on managing your symptoms. Treatment may include:  Medicines. Everyone responds to medicines differently. Your response may change over time. Work with your health care provider to find the best medicines for you.  Speech, occupational, and physical therapy. Your health care provider can help direct you to support groups, organizations, and other health care providers who can help with decisions about your care. Follow these instructions at home: Medicine  Take over-the-counter and prescription medicines only as told by your  health care provider.  To help you manage your medicines, use a pill organizer or pill reminder.  Avoid taking medicines that can affect thinking, such as pain medicines or sleeping medicines. Lifestyle  Make healthy lifestyle choices: ? Be physically active as told by your health care  provider. ? Do not use any products that contain nicotine or tobacco, such as cigarettes, e-cigarettes, and chewing tobacco. If you need help quitting, ask your health care provider. ? Try to practice stress-management techniques when you experience stress, such as mindfulness, yoga, or deep breathing. ? Stay socially connected. Talk regularly with other people, such as family, friends, and neighbors.  Make sure you sleep well. These tips can help you get a good night's rest: ? Avoid napping during the day. ? Keep your sleeping area dark and cool. ? Avoid exercising a few hours before you go to bed. ? Avoid caffeine products in the evening. Eating and drinking  Do not drink alcohol.  Drink enough fluid to keep your urine pale yellow.  Eat a healthy diet. Safety      Work with your health care provider to determine what you need help with and what your safety needs are.  If you have trouble moving around, use a cane or walker as told by your health care provider.  Make sure your home environment is safe. To do this: ? Remove things that can be a tripping hazard, such as throw rugs or clutter. ? Install grab bars and railings in your home to prevent falls.  Talk with your health care provider about if it is safe for you to drive.  If you were given a bracelet that identifies you as a person with memory loss or tracks your location, make sure to wear it at all times. General instructions  Work with your family to make important decisions, such as advance directives, medical power of attorney, or a living will.  Keep all follow-up visits as told by your health care provider. This is important. Contact a health care provider if you have:  A fever.  Problems with choking or swallowing.  Any symptoms of a new or different illness.  New or worsening trouble with sleeping or increased daytime sleepiness.  New or worsening confusion. Get help right away if:  You feel  depressed, sad, or feel that you want to harm yourself.  Your family members become concerned for your safety. If you ever feel like you may hurt yourself or others, or have thoughts about taking your own life, get help right away. You can go to your nearest emergency department or call:  Your local emergency services (911 in the [place]  A suicide crisis helpline, such as the [facility] at 585-279-4645. This is open 24 hours a day. Summary  Dementia is a condition that affects the way the brain functions. It often affects memory and thinking.  Lewy body dementia is a degenerative dementia.  This condition is caused by the buildup of proteins called Lewy bodies in brain cells. It is not known what causes the Lewy bodies to build up.  Work with your health care provider to determine what you need help with and what your safety needs are.  Your health care provider can help direct you to support groups, organizations, and other health care providers who can help with decisions about your care. This information is not intended to replace advice given to you by your health care provider. Make sure you discuss any  questions you have with your health care provider. Document Revised: 06/16/2018 Document Reviewed: 06/16/2018 Elsevier Patient Education  [place].

## 2020-05-18 DIAGNOSIS — M25561 Pain in right knee: Secondary | ICD-10-CM | POA: Diagnosis not present

## 2020-05-18 DIAGNOSIS — M1711 Unilateral primary osteoarthritis, right knee: Secondary | ICD-10-CM | POA: Diagnosis not present

## 2020-07-07 IMAGING — CT CT ABDOMEN AND PELVIS WITHOUT CONTRAST
2 of 4 series · 15 of 46 positions shown, 17 images · non-contrast
Comparison: None.

CLINICAL DATA: 72-year-old with dementia who has acute mental
status changes and recent stools that have been black and tarry.
Surgical history includes appendectomy and tubal ligation.

EXAM:
CT ABDOMEN AND PELVIS WITHOUT CONTRAST
TECHNIQUE: Multidetector CT imaging of the abdomen and pelvis was performed
following the standard protocol without IV contrast.

[Series 3: a/p w/o 5mm · axial · non-contrast · 0.74mm/px · z∈[+1001,+1376]mm · 12 of 89 slices shown, 14 images]
[im 7/89  soft-tissue]
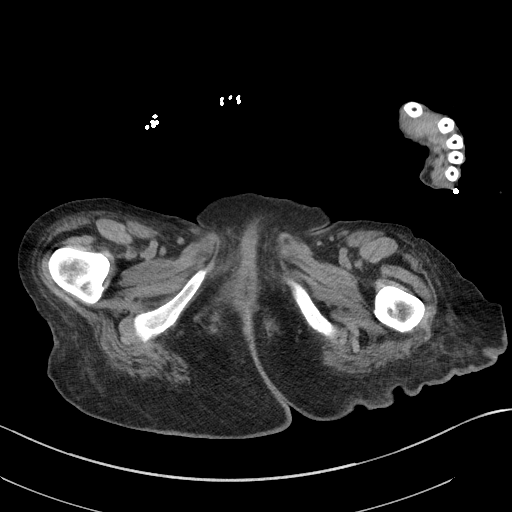
[im 7/89  bone]
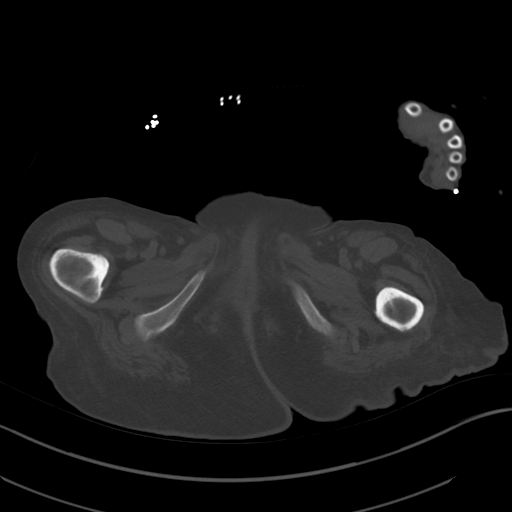
[im 14/89  soft-tissue]
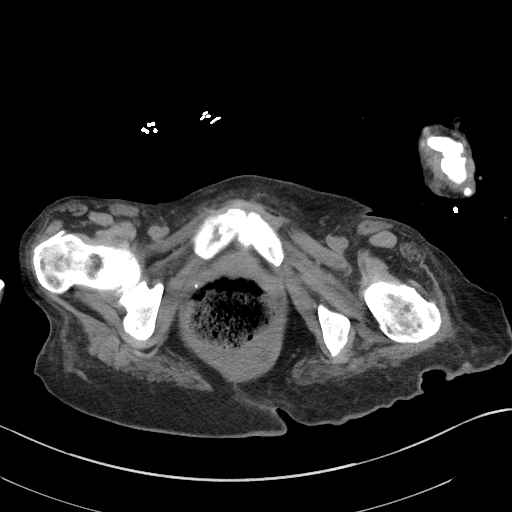
[im 21/89  soft-tissue]
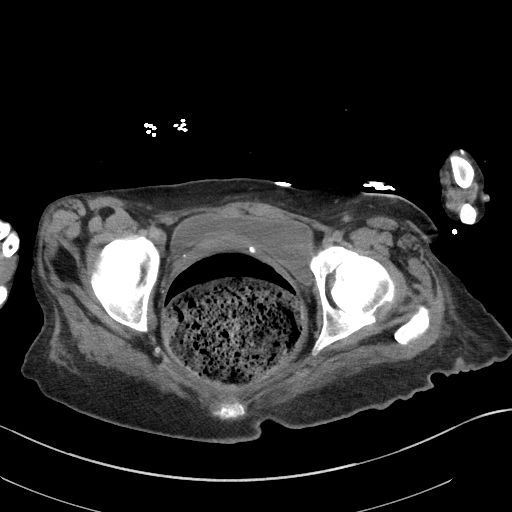
[im 28/89  soft-tissue]
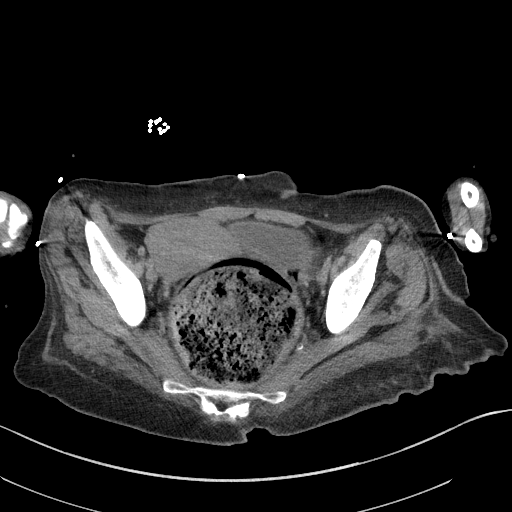
[im 34/89  soft-tissue]
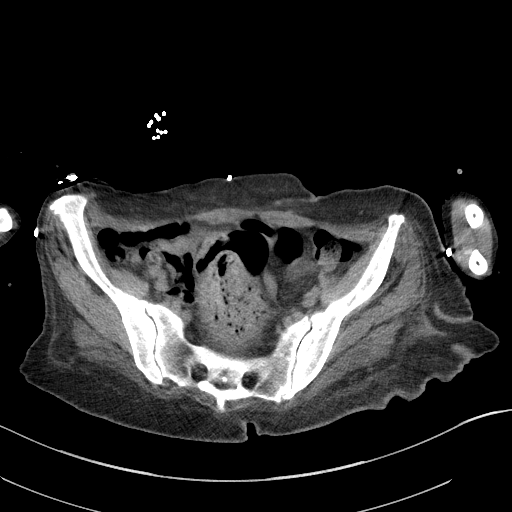
[im 41/89  soft-tissue]
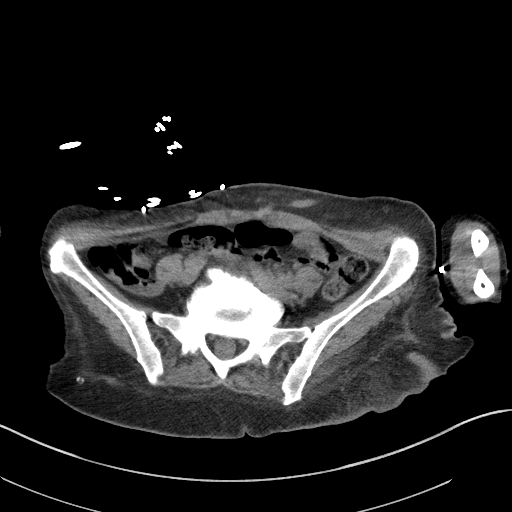
[im 48/89  soft-tissue]
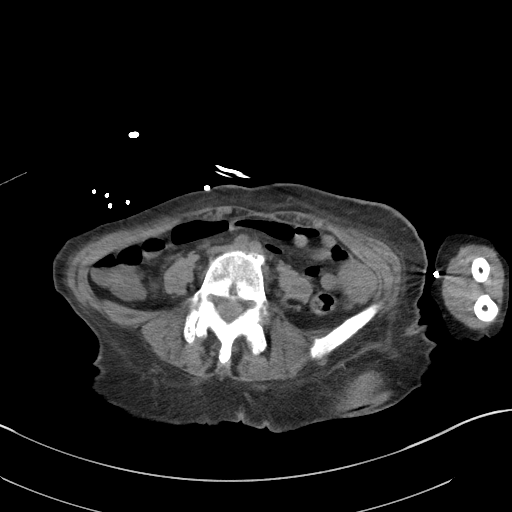
[im 55/89  soft-tissue]
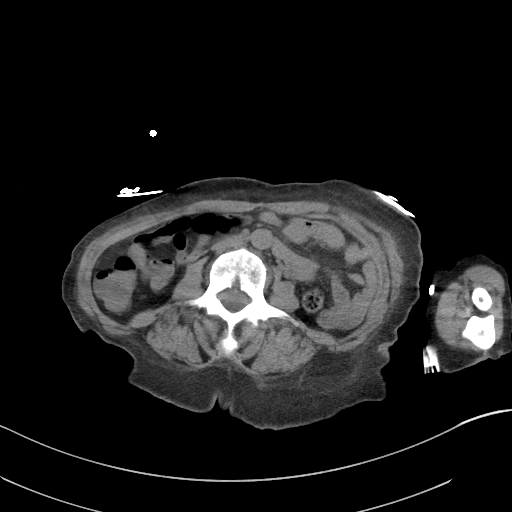
[im 61/89  soft-tissue]
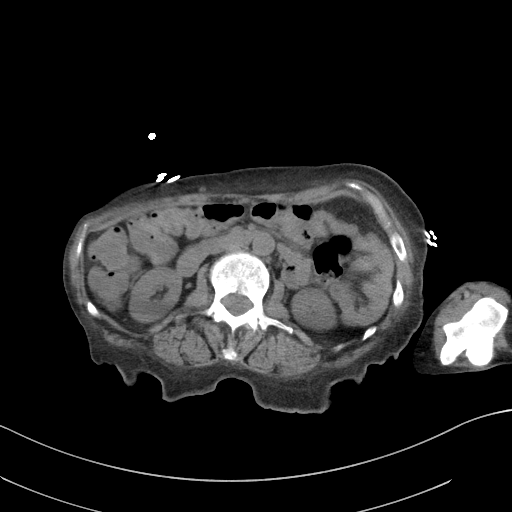
[im 61/89  bone]
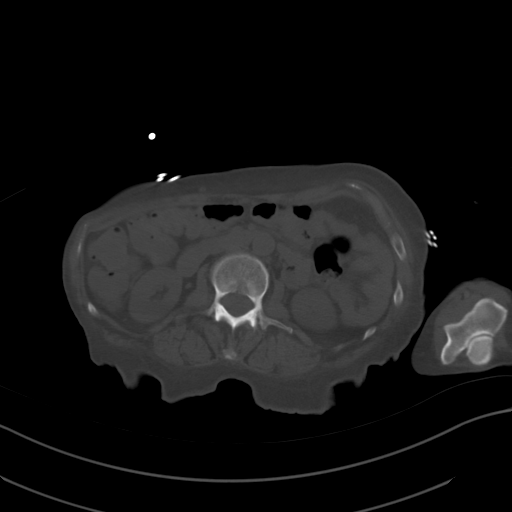
[im 68/89  soft-tissue]
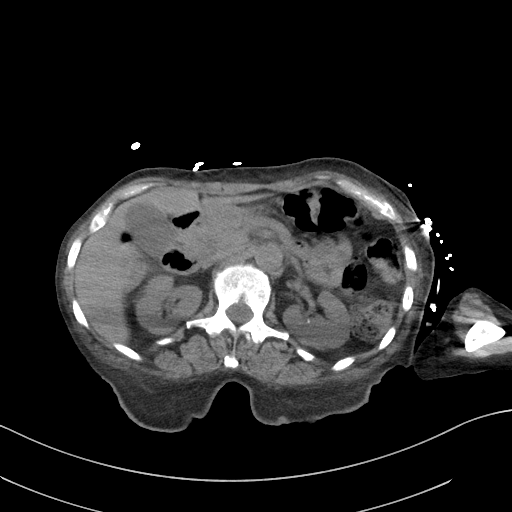
[im 75/89  soft-tissue]
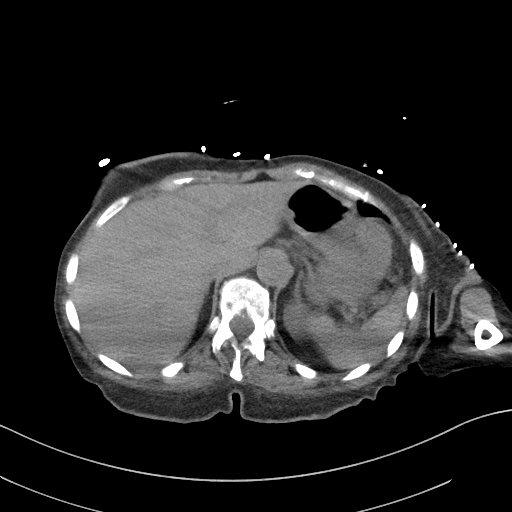
[im 82/89  soft-tissue]
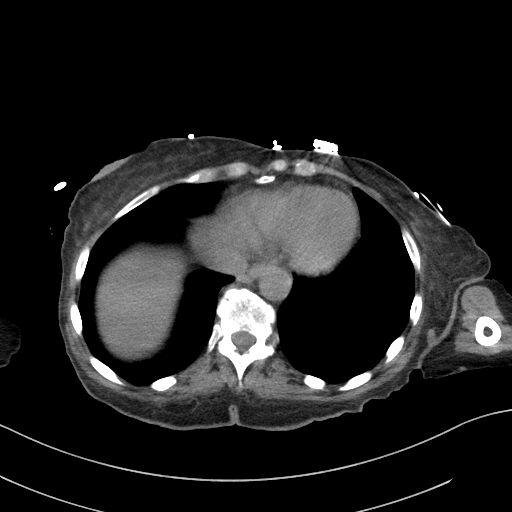

[Series 6: a/p w/o cor · coronal · non-contrast · 0.67mm/px · 3 of 120 slices shown]
[im 40/120  soft-tissue]
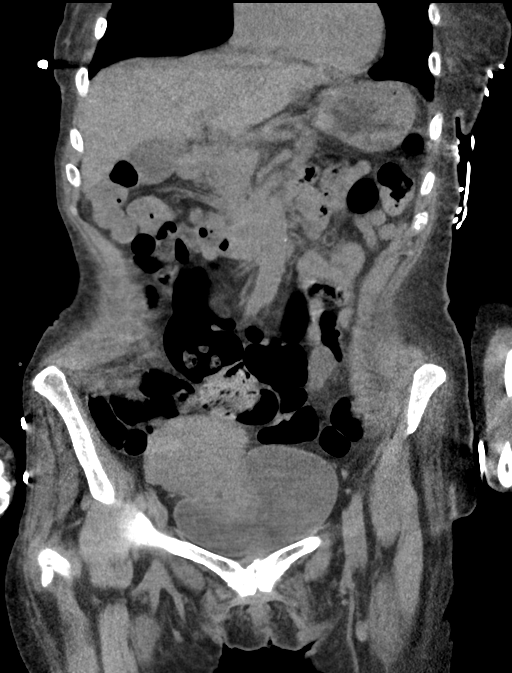
[im 53/120  soft-tissue]
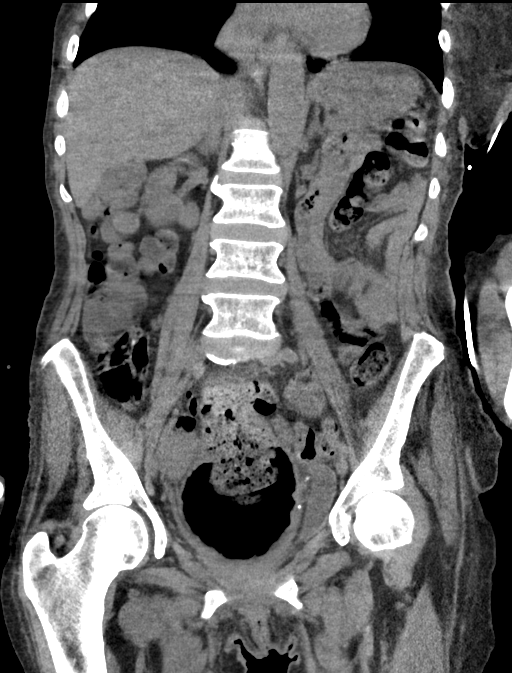
[im 67/120  soft-tissue]
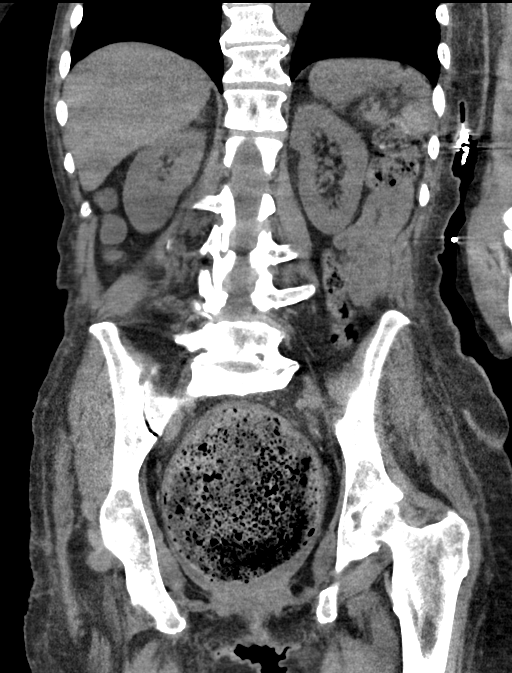

[15 of 46 positions shown; findings below may reference images not displayed]

FINDINGS: Beam hardening streak artifact is present on many of the images as
the patient was unable to raise the arms.

Lower chest: Heart size normal.  Visualized lung bases clear.

Hepatobiliary: Allowing for the beam hardening streak artifact, no
focal abnormalities involving the liver, allowing for the unenhanced
technique. Gallbladder normal in appearance without calcified
gallstones. No biliary ductal dilation.

Pancreas: Allowing for the beam hardening streak artifact, normal
unenhanced appearance.

Spleen: Allowing for the beam hardening streak artifact, normal
unenhanced appearance.

Adrenals/Urinary Tract: Normal appearing adrenal glands.
Non-obstructing approximate 4 mm calculus in a mid calyx of the LEFT
kidney. No urinary tract calculi elsewhere on either side. Allowing
for the beam hardening streak artifact and the unenhanced technique,
no focal parenchymal abnormality involving either kidney. Normal
appearing urinary bladder (which is displaced anteriorly by a
massively distended stool-filled rectum).

Stomach/Bowel: Stomach normal in appearance for the degree of
distention. Normal-appearing small bowel. Several loops of jejunum
in the LEFT UPPER QUADRANT are LATERAL to the descending colon.
Massively distended stool-filled rectum with circumferential wall
thickening of the distal rectum near the anal verge. Moderate
colonic stool burden elsewhere. Sigmoid colon elongated and
tortuous. No focal colonic abnormalities otherwise. Surgically
absent appendix.

Vascular/Lymphatic: Mild aortic atherosclerosis without evidence of
aneurysm. No pathologic lymphadenopathy.

Reproductive: Normal appearing uterus displaced anteriorly by the
massively distended rectum. No adnexal masses.

Other: Numerous pelvic phleboliths.

Musculoskeletal: Facet degenerative changes involving the LOWER
lumbar spine with degenerative grade 1-2 spondylolisthesis of L4 on
L5 measuring approximately 11 mm. Irregularity involving the LOWER
endplate of L5 is likely chronic and related to degenerative
changes.
IMPRESSION: 1. Massively distended rectum filled with stool with circumferential
wall thickening involving the distal rectum near the anal verge,
suspect fecal impaction and possible proctitis.
2. No acute abnormalities otherwise involving the abdomen or pelvis.
3. Nonobstructing 4 mm calculus in a mid calyx of the left kidney.
4. Several loops of jejunum in the left upper quadrant are LATERAL
to the descending colon raising the question of an internal hernia.
No evidence of small bowel obstruction.

## 2020-08-06 ENCOUNTER — Telehealth: Payer: Self-pay | Admitting: Podiatry

## 2020-08-06 NOTE — Telephone Encounter (Signed)
No this sounds like this must be done in person and sounds like they need an urgent appointment. Please get them scheduled

## 2020-08-06 NOTE — Telephone Encounter (Signed)
Patient husband called our office concerning her left big toe he states its blue and its coming off. He wants to know if it is possible to due a virtual visit.

## 2020-08-08 DIAGNOSIS — M79675 Pain in left toe(s): Secondary | ICD-10-CM | POA: Diagnosis not present

## 2020-08-08 DIAGNOSIS — L139 Bullous disorder, unspecified: Secondary | ICD-10-CM | POA: Diagnosis not present

## 2020-08-08 DIAGNOSIS — T148XXA Other injury of unspecified body region, initial encounter: Secondary | ICD-10-CM | POA: Diagnosis not present

## 2020-08-09 ENCOUNTER — Ambulatory Visit: Payer: Medicare HMO | Admitting: Podiatry

## 2020-08-30 DIAGNOSIS — D869 Sarcoidosis, unspecified: Secondary | ICD-10-CM | POA: Diagnosis not present

## 2020-08-30 DIAGNOSIS — L89890 Pressure ulcer of other site, unstageable: Secondary | ICD-10-CM | POA: Diagnosis not present

## 2020-08-30 DIAGNOSIS — I10 Essential (primary) hypertension: Secondary | ICD-10-CM | POA: Diagnosis not present

## 2020-08-30 DIAGNOSIS — H409 Unspecified glaucoma: Secondary | ICD-10-CM | POA: Diagnosis not present

## 2020-08-30 DIAGNOSIS — L89520 Pressure ulcer of left ankle, unstageable: Secondary | ICD-10-CM | POA: Diagnosis not present

## 2020-08-30 DIAGNOSIS — F0281 Dementia in other diseases classified elsewhere with behavioral disturbance: Secondary | ICD-10-CM | POA: Diagnosis not present

## 2020-08-30 DIAGNOSIS — G3183 Dementia with Lewy bodies: Secondary | ICD-10-CM | POA: Diagnosis not present

## 2020-08-30 DIAGNOSIS — Z4800 Encounter for change or removal of nonsurgical wound dressing: Secondary | ICD-10-CM | POA: Diagnosis not present

## 2020-08-30 DIAGNOSIS — L89610 Pressure ulcer of right heel, unstageable: Secondary | ICD-10-CM | POA: Diagnosis not present

## 2020-08-30 DIAGNOSIS — M109 Gout, unspecified: Secondary | ICD-10-CM | POA: Diagnosis not present

## 2020-09-02 DIAGNOSIS — L89610 Pressure ulcer of right heel, unstageable: Secondary | ICD-10-CM | POA: Diagnosis not present

## 2020-09-02 DIAGNOSIS — L89890 Pressure ulcer of other site, unstageable: Secondary | ICD-10-CM | POA: Diagnosis not present

## 2020-09-02 DIAGNOSIS — G3183 Dementia with Lewy bodies: Secondary | ICD-10-CM | POA: Diagnosis not present

## 2020-09-02 DIAGNOSIS — F0281 Dementia in other diseases classified elsewhere with behavioral disturbance: Secondary | ICD-10-CM | POA: Diagnosis not present

## 2020-09-02 DIAGNOSIS — M109 Gout, unspecified: Secondary | ICD-10-CM | POA: Diagnosis not present

## 2020-09-02 DIAGNOSIS — D869 Sarcoidosis, unspecified: Secondary | ICD-10-CM | POA: Diagnosis not present

## 2020-09-02 DIAGNOSIS — L89520 Pressure ulcer of left ankle, unstageable: Secondary | ICD-10-CM | POA: Diagnosis not present

## 2020-09-02 DIAGNOSIS — H409 Unspecified glaucoma: Secondary | ICD-10-CM | POA: Diagnosis not present

## 2020-09-02 DIAGNOSIS — I10 Essential (primary) hypertension: Secondary | ICD-10-CM | POA: Diagnosis not present

## 2020-09-03 DIAGNOSIS — L89890 Pressure ulcer of other site, unstageable: Secondary | ICD-10-CM | POA: Diagnosis not present

## 2020-09-03 DIAGNOSIS — H409 Unspecified glaucoma: Secondary | ICD-10-CM | POA: Diagnosis not present

## 2020-09-03 DIAGNOSIS — I10 Essential (primary) hypertension: Secondary | ICD-10-CM | POA: Diagnosis not present

## 2020-09-03 DIAGNOSIS — F0281 Dementia in other diseases classified elsewhere with behavioral disturbance: Secondary | ICD-10-CM | POA: Diagnosis not present

## 2020-09-03 DIAGNOSIS — L89520 Pressure ulcer of left ankle, unstageable: Secondary | ICD-10-CM | POA: Diagnosis not present

## 2020-09-03 DIAGNOSIS — D869 Sarcoidosis, unspecified: Secondary | ICD-10-CM | POA: Diagnosis not present

## 2020-09-03 DIAGNOSIS — L89610 Pressure ulcer of right heel, unstageable: Secondary | ICD-10-CM | POA: Diagnosis not present

## 2020-09-03 DIAGNOSIS — G3183 Dementia with Lewy bodies: Secondary | ICD-10-CM | POA: Diagnosis not present

## 2020-09-03 DIAGNOSIS — M109 Gout, unspecified: Secondary | ICD-10-CM | POA: Diagnosis not present

## 2020-09-04 DIAGNOSIS — L89610 Pressure ulcer of right heel, unstageable: Secondary | ICD-10-CM | POA: Diagnosis not present

## 2020-09-04 DIAGNOSIS — F0281 Dementia in other diseases classified elsewhere with behavioral disturbance: Secondary | ICD-10-CM | POA: Diagnosis not present

## 2020-09-04 DIAGNOSIS — M109 Gout, unspecified: Secondary | ICD-10-CM | POA: Diagnosis not present

## 2020-09-04 DIAGNOSIS — H409 Unspecified glaucoma: Secondary | ICD-10-CM | POA: Diagnosis not present

## 2020-09-04 DIAGNOSIS — I10 Essential (primary) hypertension: Secondary | ICD-10-CM | POA: Diagnosis not present

## 2020-09-04 DIAGNOSIS — L89890 Pressure ulcer of other site, unstageable: Secondary | ICD-10-CM | POA: Diagnosis not present

## 2020-09-04 DIAGNOSIS — L89520 Pressure ulcer of left ankle, unstageable: Secondary | ICD-10-CM | POA: Diagnosis not present

## 2020-09-04 DIAGNOSIS — D869 Sarcoidosis, unspecified: Secondary | ICD-10-CM | POA: Diagnosis not present

## 2020-09-04 DIAGNOSIS — G3183 Dementia with Lewy bodies: Secondary | ICD-10-CM | POA: Diagnosis not present

## 2020-09-05 ENCOUNTER — Ambulatory Visit: Payer: Medicare HMO | Admitting: Neurology

## 2020-09-05 DIAGNOSIS — H409 Unspecified glaucoma: Secondary | ICD-10-CM | POA: Diagnosis not present

## 2020-09-05 DIAGNOSIS — F0281 Dementia in other diseases classified elsewhere with behavioral disturbance: Secondary | ICD-10-CM | POA: Diagnosis not present

## 2020-09-05 DIAGNOSIS — L89890 Pressure ulcer of other site, unstageable: Secondary | ICD-10-CM | POA: Diagnosis not present

## 2020-09-05 DIAGNOSIS — G3183 Dementia with Lewy bodies: Secondary | ICD-10-CM | POA: Diagnosis not present

## 2020-09-05 DIAGNOSIS — I10 Essential (primary) hypertension: Secondary | ICD-10-CM | POA: Diagnosis not present

## 2020-09-05 DIAGNOSIS — L89520 Pressure ulcer of left ankle, unstageable: Secondary | ICD-10-CM | POA: Diagnosis not present

## 2020-09-05 DIAGNOSIS — D869 Sarcoidosis, unspecified: Secondary | ICD-10-CM | POA: Diagnosis not present

## 2020-09-05 DIAGNOSIS — L89610 Pressure ulcer of right heel, unstageable: Secondary | ICD-10-CM | POA: Diagnosis not present

## 2020-09-05 DIAGNOSIS — M109 Gout, unspecified: Secondary | ICD-10-CM | POA: Diagnosis not present

## 2020-09-06 DIAGNOSIS — L89890 Pressure ulcer of other site, unstageable: Secondary | ICD-10-CM | POA: Diagnosis not present

## 2020-09-06 DIAGNOSIS — D869 Sarcoidosis, unspecified: Secondary | ICD-10-CM | POA: Diagnosis not present

## 2020-09-06 DIAGNOSIS — M109 Gout, unspecified: Secondary | ICD-10-CM | POA: Diagnosis not present

## 2020-09-06 DIAGNOSIS — I10 Essential (primary) hypertension: Secondary | ICD-10-CM | POA: Diagnosis not present

## 2020-09-06 DIAGNOSIS — L89610 Pressure ulcer of right heel, unstageable: Secondary | ICD-10-CM | POA: Diagnosis not present

## 2020-09-06 DIAGNOSIS — G3183 Dementia with Lewy bodies: Secondary | ICD-10-CM | POA: Diagnosis not present

## 2020-09-06 DIAGNOSIS — H409 Unspecified glaucoma: Secondary | ICD-10-CM | POA: Diagnosis not present

## 2020-09-06 DIAGNOSIS — F0281 Dementia in other diseases classified elsewhere with behavioral disturbance: Secondary | ICD-10-CM | POA: Diagnosis not present

## 2020-09-06 DIAGNOSIS — L89520 Pressure ulcer of left ankle, unstageable: Secondary | ICD-10-CM | POA: Diagnosis not present

## 2020-09-10 DIAGNOSIS — I10 Essential (primary) hypertension: Secondary | ICD-10-CM | POA: Diagnosis not present

## 2020-09-10 DIAGNOSIS — D869 Sarcoidosis, unspecified: Secondary | ICD-10-CM | POA: Diagnosis not present

## 2020-09-10 DIAGNOSIS — F0281 Dementia in other diseases classified elsewhere with behavioral disturbance: Secondary | ICD-10-CM | POA: Diagnosis not present

## 2020-09-10 DIAGNOSIS — M109 Gout, unspecified: Secondary | ICD-10-CM | POA: Diagnosis not present

## 2020-09-10 DIAGNOSIS — H409 Unspecified glaucoma: Secondary | ICD-10-CM | POA: Diagnosis not present

## 2020-09-10 DIAGNOSIS — L89610 Pressure ulcer of right heel, unstageable: Secondary | ICD-10-CM | POA: Diagnosis not present

## 2020-09-10 DIAGNOSIS — G3183 Dementia with Lewy bodies: Secondary | ICD-10-CM | POA: Diagnosis not present

## 2020-09-10 DIAGNOSIS — L89890 Pressure ulcer of other site, unstageable: Secondary | ICD-10-CM | POA: Diagnosis not present

## 2020-09-10 DIAGNOSIS — L89520 Pressure ulcer of left ankle, unstageable: Secondary | ICD-10-CM | POA: Diagnosis not present

## 2020-09-12 DIAGNOSIS — L89610 Pressure ulcer of right heel, unstageable: Secondary | ICD-10-CM | POA: Diagnosis not present

## 2020-09-12 DIAGNOSIS — F0281 Dementia in other diseases classified elsewhere with behavioral disturbance: Secondary | ICD-10-CM | POA: Diagnosis not present

## 2020-09-12 DIAGNOSIS — G3183 Dementia with Lewy bodies: Secondary | ICD-10-CM | POA: Diagnosis not present

## 2020-09-12 DIAGNOSIS — I10 Essential (primary) hypertension: Secondary | ICD-10-CM | POA: Diagnosis not present

## 2020-09-12 DIAGNOSIS — M109 Gout, unspecified: Secondary | ICD-10-CM | POA: Diagnosis not present

## 2020-09-12 DIAGNOSIS — D869 Sarcoidosis, unspecified: Secondary | ICD-10-CM | POA: Diagnosis not present

## 2020-09-12 DIAGNOSIS — L89890 Pressure ulcer of other site, unstageable: Secondary | ICD-10-CM | POA: Diagnosis not present

## 2020-09-12 DIAGNOSIS — L89520 Pressure ulcer of left ankle, unstageable: Secondary | ICD-10-CM | POA: Diagnosis not present

## 2020-09-12 DIAGNOSIS — H409 Unspecified glaucoma: Secondary | ICD-10-CM | POA: Diagnosis not present

## 2020-09-24 DIAGNOSIS — R059 Cough, unspecified: Secondary | ICD-10-CM | POA: Diagnosis not present

## 2020-10-18 DIAGNOSIS — L89510 Pressure ulcer of right ankle, unstageable: Secondary | ICD-10-CM | POA: Diagnosis not present

## 2020-10-18 DIAGNOSIS — L89520 Pressure ulcer of left ankle, unstageable: Secondary | ICD-10-CM | POA: Diagnosis not present

## 2020-10-18 DIAGNOSIS — L89320 Pressure ulcer of left buttock, unstageable: Secondary | ICD-10-CM | POA: Diagnosis not present

## 2020-10-18 DIAGNOSIS — L89890 Pressure ulcer of other site, unstageable: Secondary | ICD-10-CM | POA: Diagnosis not present

## 2020-10-18 DIAGNOSIS — G3183 Dementia with Lewy bodies: Secondary | ICD-10-CM | POA: Diagnosis not present

## 2020-10-18 DIAGNOSIS — I10 Essential (primary) hypertension: Secondary | ICD-10-CM | POA: Diagnosis not present

## 2020-10-18 DIAGNOSIS — L89610 Pressure ulcer of right heel, unstageable: Secondary | ICD-10-CM | POA: Diagnosis not present

## 2020-10-18 DIAGNOSIS — F0281 Dementia in other diseases classified elsewhere with behavioral disturbance: Secondary | ICD-10-CM | POA: Diagnosis not present

## 2020-10-18 DIAGNOSIS — M1990 Unspecified osteoarthritis, unspecified site: Secondary | ICD-10-CM | POA: Diagnosis not present

## 2020-10-21 DIAGNOSIS — G3183 Dementia with Lewy bodies: Secondary | ICD-10-CM | POA: Diagnosis not present

## 2020-10-21 DIAGNOSIS — M1990 Unspecified osteoarthritis, unspecified site: Secondary | ICD-10-CM | POA: Diagnosis not present

## 2020-10-21 DIAGNOSIS — L89610 Pressure ulcer of right heel, unstageable: Secondary | ICD-10-CM | POA: Diagnosis not present

## 2020-10-21 DIAGNOSIS — L89510 Pressure ulcer of right ankle, unstageable: Secondary | ICD-10-CM | POA: Diagnosis not present

## 2020-10-21 DIAGNOSIS — L89520 Pressure ulcer of left ankle, unstageable: Secondary | ICD-10-CM | POA: Diagnosis not present

## 2020-10-21 DIAGNOSIS — L89320 Pressure ulcer of left buttock, unstageable: Secondary | ICD-10-CM | POA: Diagnosis not present

## 2020-10-21 DIAGNOSIS — F0281 Dementia in other diseases classified elsewhere with behavioral disturbance: Secondary | ICD-10-CM | POA: Diagnosis not present

## 2020-10-21 DIAGNOSIS — L89890 Pressure ulcer of other site, unstageable: Secondary | ICD-10-CM | POA: Diagnosis not present

## 2020-10-21 DIAGNOSIS — I10 Essential (primary) hypertension: Secondary | ICD-10-CM | POA: Diagnosis not present

## 2020-10-22 DIAGNOSIS — L89510 Pressure ulcer of right ankle, unstageable: Secondary | ICD-10-CM | POA: Diagnosis not present

## 2020-10-22 DIAGNOSIS — L89610 Pressure ulcer of right heel, unstageable: Secondary | ICD-10-CM | POA: Diagnosis not present

## 2020-10-22 DIAGNOSIS — I10 Essential (primary) hypertension: Secondary | ICD-10-CM | POA: Diagnosis not present

## 2020-10-22 DIAGNOSIS — L89520 Pressure ulcer of left ankle, unstageable: Secondary | ICD-10-CM | POA: Diagnosis not present

## 2020-10-22 DIAGNOSIS — G3183 Dementia with Lewy bodies: Secondary | ICD-10-CM | POA: Diagnosis not present

## 2020-10-22 DIAGNOSIS — L89320 Pressure ulcer of left buttock, unstageable: Secondary | ICD-10-CM | POA: Diagnosis not present

## 2020-10-22 DIAGNOSIS — L89890 Pressure ulcer of other site, unstageable: Secondary | ICD-10-CM | POA: Diagnosis not present

## 2020-10-22 DIAGNOSIS — F0281 Dementia in other diseases classified elsewhere with behavioral disturbance: Secondary | ICD-10-CM | POA: Diagnosis not present

## 2020-10-22 DIAGNOSIS — M1990 Unspecified osteoarthritis, unspecified site: Secondary | ICD-10-CM | POA: Diagnosis not present

## 2020-10-25 DIAGNOSIS — M1990 Unspecified osteoarthritis, unspecified site: Secondary | ICD-10-CM | POA: Diagnosis not present

## 2020-10-25 DIAGNOSIS — L89610 Pressure ulcer of right heel, unstageable: Secondary | ICD-10-CM | POA: Diagnosis not present

## 2020-10-25 DIAGNOSIS — I10 Essential (primary) hypertension: Secondary | ICD-10-CM | POA: Diagnosis not present

## 2020-10-25 DIAGNOSIS — F0281 Dementia in other diseases classified elsewhere with behavioral disturbance: Secondary | ICD-10-CM | POA: Diagnosis not present

## 2020-10-25 DIAGNOSIS — L89320 Pressure ulcer of left buttock, unstageable: Secondary | ICD-10-CM | POA: Diagnosis not present

## 2020-10-25 DIAGNOSIS — L89520 Pressure ulcer of left ankle, unstageable: Secondary | ICD-10-CM | POA: Diagnosis not present

## 2020-10-25 DIAGNOSIS — L89510 Pressure ulcer of right ankle, unstageable: Secondary | ICD-10-CM | POA: Diagnosis not present

## 2020-10-25 DIAGNOSIS — G3183 Dementia with Lewy bodies: Secondary | ICD-10-CM | POA: Diagnosis not present

## 2020-10-25 DIAGNOSIS — L89890 Pressure ulcer of other site, unstageable: Secondary | ICD-10-CM | POA: Diagnosis not present

## 2020-10-29 ENCOUNTER — Other Ambulatory Visit (HOSPITAL_COMMUNITY): Payer: Self-pay | Admitting: Internal Medicine

## 2020-10-29 ENCOUNTER — Ambulatory Visit (HOSPITAL_COMMUNITY)
Admission: RE | Admit: 2020-10-29 | Discharge: 2020-10-29 | Disposition: A | Discharge status: Home / Self Care | Payer: Medicare Other | Source: Ambulatory Visit | Attending: Internal Medicine | Admitting: Internal Medicine

## 2020-10-29 ENCOUNTER — Other Ambulatory Visit (HOSPITAL_COMMUNITY)
Admission: RE | Admit: 2020-10-29 | Discharge: 2020-10-29 | Disposition: A | Discharge status: Home / Self Care | Payer: Medicare Other | Attending: Internal Medicine | Admitting: Internal Medicine

## 2020-10-29 ENCOUNTER — Other Ambulatory Visit: Payer: Self-pay

## 2020-10-29 ENCOUNTER — Encounter (HOSPITAL_BASED_OUTPATIENT_CLINIC_OR_DEPARTMENT_OTHER): Payer: Medicare Other | Attending: Internal Medicine | Admitting: Internal Medicine

## 2020-10-29 DIAGNOSIS — L89159 Pressure ulcer of sacral region, unspecified stage: Secondary | ICD-10-CM | POA: Diagnosis not present

## 2020-10-29 DIAGNOSIS — L89890 Pressure ulcer of other site, unstageable: Secondary | ICD-10-CM | POA: Diagnosis not present

## 2020-10-29 DIAGNOSIS — L89150 Pressure ulcer of sacral region, unstageable: Secondary | ICD-10-CM | POA: Insufficient documentation

## 2020-10-29 DIAGNOSIS — L97518 Non-pressure chronic ulcer of other part of right foot with other specified severity: Secondary | ICD-10-CM | POA: Insufficient documentation

## 2020-10-29 DIAGNOSIS — L89520 Pressure ulcer of left ankle, unstageable: Secondary | ICD-10-CM | POA: Diagnosis not present

## 2020-10-29 DIAGNOSIS — L97528 Non-pressure chronic ulcer of other part of left foot with other specified severity: Secondary | ICD-10-CM | POA: Insufficient documentation

## 2020-10-29 DIAGNOSIS — L89224 Pressure ulcer of left hip, stage 4: Secondary | ICD-10-CM | POA: Insufficient documentation

## 2020-10-29 DIAGNOSIS — L89510 Pressure ulcer of right ankle, unstageable: Secondary | ICD-10-CM | POA: Diagnosis not present

## 2020-10-29 DIAGNOSIS — F0280 Dementia in other diseases classified elsewhere without behavioral disturbance: Secondary | ICD-10-CM | POA: Diagnosis not present

## 2020-10-29 DIAGNOSIS — L89622 Pressure ulcer of left heel, stage 2: Secondary | ICD-10-CM | POA: Diagnosis not present

## 2020-10-29 DIAGNOSIS — I10 Essential (primary) hypertension: Secondary | ICD-10-CM | POA: Diagnosis not present

## 2020-10-29 DIAGNOSIS — L89320 Pressure ulcer of left buttock, unstageable: Secondary | ICD-10-CM | POA: Diagnosis not present

## 2020-10-29 DIAGNOSIS — M1990 Unspecified osteoarthritis, unspecified site: Secondary | ICD-10-CM | POA: Diagnosis not present

## 2020-10-29 DIAGNOSIS — G3183 Dementia with Lewy bodies: Secondary | ICD-10-CM | POA: Insufficient documentation

## 2020-10-29 DIAGNOSIS — L89610 Pressure ulcer of right heel, unstageable: Secondary | ICD-10-CM | POA: Diagnosis not present

## 2020-10-29 DIAGNOSIS — L89614 Pressure ulcer of right heel, stage 4: Secondary | ICD-10-CM | POA: Diagnosis not present

## 2020-10-29 DIAGNOSIS — F0281 Dementia in other diseases classified elsewhere with behavioral disturbance: Secondary | ICD-10-CM | POA: Diagnosis not present

## 2020-11-01 DIAGNOSIS — L89890 Pressure ulcer of other site, unstageable: Secondary | ICD-10-CM | POA: Diagnosis not present

## 2020-11-01 DIAGNOSIS — M1990 Unspecified osteoarthritis, unspecified site: Secondary | ICD-10-CM | POA: Diagnosis not present

## 2020-11-01 DIAGNOSIS — L89610 Pressure ulcer of right heel, unstageable: Secondary | ICD-10-CM | POA: Diagnosis not present

## 2020-11-01 DIAGNOSIS — L89520 Pressure ulcer of left ankle, unstageable: Secondary | ICD-10-CM | POA: Diagnosis not present

## 2020-11-01 DIAGNOSIS — F0281 Dementia in other diseases classified elsewhere with behavioral disturbance: Secondary | ICD-10-CM | POA: Diagnosis not present

## 2020-11-01 DIAGNOSIS — L89510 Pressure ulcer of right ankle, unstageable: Secondary | ICD-10-CM | POA: Diagnosis not present

## 2020-11-01 DIAGNOSIS — I10 Essential (primary) hypertension: Secondary | ICD-10-CM | POA: Diagnosis not present

## 2020-11-01 DIAGNOSIS — G3183 Dementia with Lewy bodies: Secondary | ICD-10-CM | POA: Diagnosis not present

## 2020-11-01 DIAGNOSIS — L89320 Pressure ulcer of left buttock, unstageable: Secondary | ICD-10-CM | POA: Diagnosis not present

## 2020-11-01 LAB — AEROBIC CULTURE W GRAM STAIN (SUPERFICIAL SPECIMEN)

## 2020-11-02 LAB — AEROBIC CULTURE W GRAM STAIN (SUPERFICIAL SPECIMEN)

## 2020-11-04 DIAGNOSIS — L89520 Pressure ulcer of left ankle, unstageable: Secondary | ICD-10-CM | POA: Diagnosis not present

## 2020-11-04 DIAGNOSIS — F0281 Dementia in other diseases classified elsewhere with behavioral disturbance: Secondary | ICD-10-CM | POA: Diagnosis not present

## 2020-11-04 DIAGNOSIS — L89510 Pressure ulcer of right ankle, unstageable: Secondary | ICD-10-CM | POA: Diagnosis not present

## 2020-11-04 DIAGNOSIS — L89320 Pressure ulcer of left buttock, unstageable: Secondary | ICD-10-CM | POA: Diagnosis not present

## 2020-11-04 DIAGNOSIS — M1990 Unspecified osteoarthritis, unspecified site: Secondary | ICD-10-CM | POA: Diagnosis not present

## 2020-11-04 DIAGNOSIS — L89610 Pressure ulcer of right heel, unstageable: Secondary | ICD-10-CM | POA: Diagnosis not present

## 2020-11-04 DIAGNOSIS — L89890 Pressure ulcer of other site, unstageable: Secondary | ICD-10-CM | POA: Diagnosis not present

## 2020-11-04 DIAGNOSIS — G3183 Dementia with Lewy bodies: Secondary | ICD-10-CM | POA: Diagnosis not present

## 2020-11-04 DIAGNOSIS — I10 Essential (primary) hypertension: Secondary | ICD-10-CM | POA: Diagnosis not present

## 2020-11-05 ENCOUNTER — Encounter (HOSPITAL_BASED_OUTPATIENT_CLINIC_OR_DEPARTMENT_OTHER): Payer: Medicare HMO | Attending: Internal Medicine | Admitting: Internal Medicine

## 2020-11-05 ENCOUNTER — Other Ambulatory Visit: Payer: Self-pay

## 2020-11-05 DIAGNOSIS — L97528 Non-pressure chronic ulcer of other part of left foot with other specified severity: Secondary | ICD-10-CM | POA: Insufficient documentation

## 2020-11-05 DIAGNOSIS — L89510 Pressure ulcer of right ankle, unstageable: Secondary | ICD-10-CM | POA: Insufficient documentation

## 2020-11-05 DIAGNOSIS — L89224 Pressure ulcer of left hip, stage 4: Secondary | ICD-10-CM | POA: Insufficient documentation

## 2020-11-05 DIAGNOSIS — L89890 Pressure ulcer of other site, unstageable: Secondary | ICD-10-CM | POA: Diagnosis not present

## 2020-11-05 DIAGNOSIS — L89614 Pressure ulcer of right heel, stage 4: Secondary | ICD-10-CM | POA: Diagnosis not present

## 2020-11-05 DIAGNOSIS — L89150 Pressure ulcer of sacral region, unstageable: Secondary | ICD-10-CM | POA: Insufficient documentation

## 2020-11-05 DIAGNOSIS — L89520 Pressure ulcer of left ankle, unstageable: Secondary | ICD-10-CM | POA: Diagnosis not present

## 2020-11-05 DIAGNOSIS — L89154 Pressure ulcer of sacral region, stage 4: Secondary | ICD-10-CM | POA: Diagnosis not present

## 2020-11-05 DIAGNOSIS — L97518 Non-pressure chronic ulcer of other part of right foot with other specified severity: Secondary | ICD-10-CM | POA: Insufficient documentation

## 2020-11-06 DIAGNOSIS — I10 Essential (primary) hypertension: Secondary | ICD-10-CM | POA: Diagnosis not present

## 2020-11-06 DIAGNOSIS — L89890 Pressure ulcer of other site, unstageable: Secondary | ICD-10-CM | POA: Diagnosis not present

## 2020-11-06 DIAGNOSIS — L89610 Pressure ulcer of right heel, unstageable: Secondary | ICD-10-CM | POA: Diagnosis not present

## 2020-11-06 DIAGNOSIS — M1990 Unspecified osteoarthritis, unspecified site: Secondary | ICD-10-CM | POA: Diagnosis not present

## 2020-11-06 DIAGNOSIS — L89510 Pressure ulcer of right ankle, unstageable: Secondary | ICD-10-CM | POA: Diagnosis not present

## 2020-11-06 DIAGNOSIS — L89520 Pressure ulcer of left ankle, unstageable: Secondary | ICD-10-CM | POA: Diagnosis not present

## 2020-11-06 DIAGNOSIS — F0281 Dementia in other diseases classified elsewhere with behavioral disturbance: Secondary | ICD-10-CM | POA: Diagnosis not present

## 2020-11-06 DIAGNOSIS — G3183 Dementia with Lewy bodies: Secondary | ICD-10-CM | POA: Diagnosis not present

## 2020-11-06 DIAGNOSIS — L89320 Pressure ulcer of left buttock, unstageable: Secondary | ICD-10-CM | POA: Diagnosis not present

## 2020-11-06 NOTE — Progress Notes (Addendum)
Tanya Jensen, Tanya Jensen ([id number]) Visit Report for 11/05/2020 Arrival Information Details Patient Name: Date of Service: Tanya Jensen 11/05/2020 1:45 PM Medical Record Number: [id number] Patient Account Number: [id number] Date of Birth/Sex: Treating RN: 12-Jun-1945 (75 y.o. Sue Lush Primary Care Ludella Pranger: Shirline Frees Other Clinician: Referring Arieana Somoza: Treating Dillyn Menna/Extender: Ponciano Ort in Treatment: 1 Visit Information History Since Last Visit Added or deleted any medications: No Patient Arrived: Ambulatory Any new allergies or adverse reactions: No Arrival Time: 14:08 Had a fall or experienced change in No Accompanied By: husband activities of daily living that may affect Transfer Assistance: None risk of falls: Patient Identification Verified: Yes Signs or symptoms of abuse/neglect since last visito No Secondary Verification Process Completed: Yes Hospitalized since last visit: No Patient Requires Transmission-Based Precautions: No Implantable device outside of the clinic excluding No Patient Has Alerts: No cellular tissue based products placed in the center since last visit: Has Dressing in Place as Prescribed: Yes Pain Present Now: No Electronic Signature(s) Signed: 11/05/2020 3:40:40 PM By: Sandre Kitty Entered By: Sandre Kitty on 11/05/2020 14:08:47 -------------------------------------------------------------------------------- Encounter Discharge Information Details Patient Name: Date of Service: Tanya Jensen. 11/05/2020 1:45 PM Medical Record Number: [id number] Patient Account Number: [id number] Date of Birth/Sex: Treating RN: Feb 12, 1946 (75 y.o. Nita Sells Primary Care Daren Yeagle: Shirline Frees Other Clinician: Referring Prairie Stenberg: Treating Donatello Kleve/Extender: Ponciano Ort in Treatment: 1 Encounter Discharge Information Items Post Procedure Vitals Discharge Condition:  Stable Temperature (F): 98.2 Ambulatory Status: Wheelchair Pulse (bpm): 69 Discharge Destination: Home Respiratory Rate (breaths/min): 18 Transportation: Private Auto Blood Pressure (mmHg): 120/85 Accompanied By: spouse Schedule Follow-up Appointment: No Clinical Summary of Care: Patient Declined Electronic Signature(s) Signed: 11/05/2020 6:17:43 PM By: Leane Call Entered By: Leane Call on 11/05/2020 18:04:53 -------------------------------------------------------------------------------- Lower Extremity Assessment Details Patient Name: Date of Service: Tanya Jensen 11/05/2020 1:45 PM Medical Record Number: [id number] Patient Account Number: [id number] Date of Birth/Sex: Treating RN: 1945/08/30 (75 y.o. Nita Sells Primary Care Patrici Minnis: Shirline Frees Other Clinician: Referring Martavion Couper: Treating Susen Haskew/Extender: Ponciano Ort in Treatment: 1 Edema Assessment Assessed: Shirlyn Goltz: No] Patrice Paradise: No] E[Left: dema] [Right: :] Calf Left: Right: Point of Measurement: 30 cm From Medial Instep 36 cm 36.5 cm Ankle Left: Right: Point of Measurement: 9 cm From Medial Instep 22.5 cm 22 cm Vascular Assessment Pulses: Dorsalis Pedis Palpable: [Left:Yes] [Right:Yes] Electronic Signature(s) Signed: 11/05/2020 6:17:43 PM By: Leane Call Entered By: Leane Call on 11/05/2020 14:47:48 -------------------------------------------------------------------------------- Multi Wound Chart Details Patient Name: Date of Service: Tanya Ou L. 11/05/2020 1:45 PM Medical Record Number: [id number] Patient Account Number: [id number] Date of Birth/Sex: Treating RN: 26-Mar-1946 (75 y.o. Sue Lush Primary Care Bernice Mcauliffe: Shirline Frees Other Clinician: Referring Tymarion Everard: Treating Jullianna Gabor/Extender: Ponciano Ort in Treatment: 1 Vital Signs Height(in): 67 Pulse(bpm): 70 Weight(lbs): Blood Pressure(mmHg):  120/85 Body Mass Index(BMI): Temperature(F): 98.2 Respiratory Rate(breaths/min): 18 Photos: [1:No Photos Sacrum] [10:No Photos Right, Distal T Great oe] [2:No Photos Left Trochanter] Wound Location: [1:Pressure Injury] [10:Pressure Injury] [2:Pressure Injury] Wounding Event: [1:Pressure Ulcer] [10:Pressure Ulcer] [2:Pressure Ulcer] Primary Etiology: [1:Cataracts, Glaucoma, Hypertension,] [10:Cataracts, Glaucoma, Hypertension,] [2:Cataracts, Glaucoma, Hypertension,] Comorbid History: [1:Lupus Erythematosus, Osteoarthritis, Dementia 07/29/2020] [10:Lupus Erythematosus, Osteoarthritis, Dementia 07/29/2020] [2:Lupus Erythematosus, Osteoarthritis, Dementia 07/29/2020] Date Acquired: [1:1] [10:1] [2:1] Weeks of Treatment: [1:Open] [10:Open] [2:Open] Wound Status: [1:8.5x8x3.5] [10:1.5x1.3x0.1] [2:1.9x1.1x2] Measurements L x W x D (cm) [1:53.407] [10:1.532] [2:1.641] A (cm) : rea [1:186.925] [10:0.153] [2:3.283] Volume (cm) : [1:59.40%] [  10:-28.30%] [2:5.00%] % Reduction in A rea: [1:-58.00%] [10:-28.60%] [2:-26.70%] % Reduction in Volume: [1:Category/Stage IV] [10:Unstageable/Unclassified] [2:Category/Stage IV] Classification: [1:Large] [10:Medium] [2:Large] Exudate A mount: [1:Serosanguineous] [10:Serosanguineous] [2:Purulent] Exudate Type: [1:red, brown] [10:red, brown] [2:yellow, brown, green] Exudate Color: [1:Yes] [10:No] [2:Yes] Foul Odor A Cleansing: [1:fter No] [10:N/A] [2:No] Odor A nticipated Due to Product Use: [1:Flat and Intact] [10:Indistinct, nonvisible] [2:Thickened] Wound Margin: [1:Small (1-33%)] [10:None Present (0%)] [2:Small (1-33%)] Granulation A mount: [1:Red] [10:N/A] [2:Red] Granulation Quality: [1:Medium (34-66%)] [10:Large (67-100%)] [2:Large (67-100%)] Necrotic A mount: [1:Eschar, Adherent Slough] [10:Eschar] [2:N/A] Necrotic Tissue: [1:Fat Layer (Subcutaneous Tissue): Yes Fat Layer (Subcutaneous Tissue): Yes Fat Layer (Subcutaneous Tissue): Yes] Exposed  Structures: [1:Muscle: Yes Fascia: No Tendon: No Joint: No Bone: No None] [10:Fascia: No Tendon: No Muscle: No Joint: No Bone: No None] [2:Muscle: Yes Fascia: No Tendon: No Joint: No Bone: No None] Epithelialization: [1:Debridement - Excisional] [10:N/A] [2:N/A] Debridement: Pre-procedure Verification/Time Out 15:24 [10:N/A] [2:N/A] Taken: [1:Other] [10:N/A] [2:N/A] Pain Control: [1:Subcutaneous] [10:N/A] [2:N/A] Tissue Debrided: [1:Skin/Subcutaneous Tissue] [10:N/A] [2:N/A] Level: [1:20] [10:N/A] [2:N/A] Debridement A (sq cm): [1:rea Forceps, Scissors] [10:N/A] [2:N/A] Instrument: [1:Minimum] [10:N/A] [2:N/A] Bleeding: [1:Pressure] [10:N/A] [2:N/A] Hemostasis A chieved: [1:Procedure was tolerated well] [10:N/A] [2:N/A] Debridement Treatment Response: [1:8.5x8x3.5] [10:N/A] [2:N/A] Post Debridement Measurements L x W x D (cm) [1:186.925] [10:N/A] [2:N/A] Post Debridement Volume: (cm) [1:Category/Stage IV] [10:N/A] [2:N/A] Post Debridement Stage: [1:N/A] [10:N/A] [2:N/A] Assessment Notes: [1:Debridement] [10:N/A] [2:N/A] Wound Number: 3 4 5  Photos: No Photos No Photos No Photos Right, Lateral Malleolus Right Calcaneus Left Calcaneus Wound Location: Pressure Injury Pressure Injury Pressure Injury Wounding Event: Pressure Ulcer Pressure Ulcer Pressure Ulcer Primary Etiology: Cataracts, Glaucoma, Hypertension, N/A Cataracts, Glaucoma, Hypertension, Comorbid History: Lupus Erythematosus, Osteoarthritis, Lupus Erythematosus, Osteoarthritis, Dementia Dementia 07/29/2020 08/29/2020 07/29/2020 Date Acquired: 1 1 1  Weeks of Treatment: Open Open Open Wound Status: 1.5x1.3x0.1 4x1.5x1 3x4.1x0.1 Measurements L x W x D (cm) 1.532 4.712 9.66 A (cm) : rea 0.153 4.712 0.966 Volume (cm) : 30.30% 25.00% 67.20% % Reduction in A rea: 30.50% 16.70% 67.20% % Reduction in Volume: Unstageable/Unclassified Unstageable/Unclassified Category/Stage II Classification: Medium N/A Medium Exudate A  mount: Serosanguineous N/A Serosanguineous Exudate Type: red, brown N/A red, brown Exudate Color: No N/A No Foul Odor A Cleansing: fter N/A N/A N/A Odor A nticipated Due to Product Use: Distinct, outline attached N/A Indistinct, nonvisible Wound Margin: None Present (0%) N/A None Present (0%) Granulation A mount: N/A N/A N/A Granulation Quality: Large (67-100%) N/A None Present (0%) Necrotic A mount: Eschar, Adherent Slough N/A N/A Necrotic Tissue: Fat Layer (Subcutaneous Tissue): Yes N/A Fascia: No Exposed Structures: Fascia: No Fat Layer (Subcutaneous Tissue): No Tendon: No Tendon: No Muscle: No Muscle: No Joint: No Joint: No Bone: No Bone: No None N/A None Epithelialization: N/A Debridement - Excisional N/A Debridement: Pre-procedure Verification/Time Out N/A 15:24 N/A Taken: N/A Other N/A Pain Control: N/A Subcutaneous N/A Tissue Debrided: N/A Skin/Subcutaneous Tissue N/A Level: N/A 6 N/A Debridement A (sq cm): rea N/A Curette N/A Instrument: N/A Minimum N/A Bleeding: N/A Pressure N/A Hemostasis Achieved: N/A Procedure was tolerated well N/A Debridement Treatment Response: N/A 4x1.5x1 N/A Post Debridement Measurements L x W x D (cm) N/A 4.712 N/A Post Debridement Volume: (cm) N/A Unstageable/Unclassified N/A Post Debridement Stage: N/A N/A intact blister remains closed Assessment Notes: N/A Debridement N/A Procedures Performed: Wound Number: 6 7 8  Photos: No Photos No Photos No Photos Left, Medial Foot Left, Lateral Malleolus Left T Second oe Wound Location: Pressure Injury Pressure Injury Pressure Injury Wounding Event: Pressure Ulcer Pressure  Ulcer Pressure Ulcer Primary Etiology: Cataracts, Glaucoma, Hypertension, Cataracts, Glaucoma, Hypertension, Cataracts, Glaucoma, Hypertension, Comorbid History: Lupus Erythematosus, Osteoarthritis, Lupus Erythematosus, Osteoarthritis, Lupus Erythematosus, Osteoarthritis, Dementia Dementia  Dementia 07/29/2020 07/29/2020 07/29/2020 Date Acquired: 1 1 1  Weeks of Treatment: Open Open Open Wound Status: 0.9x0.9x0.1 8.8x2.2x0.3 0.3x0.4x0.1 Measurements L x W x D (cm) 0.636 15.205 0.094 A (cm) : rea 0.064 4.562 0.009 Volume (cm) : -65.20% -29.80% 73.40% % Reduction in A rea: -68.40% -94.80% 74.30% % Reduction in Volume: Unstageable/Unclassified Unstageable/Unclassified Unstageable/Unclassified Classification: Medium Medium Medium Exudate A mount: Serosanguineous Serosanguineous Serosanguineous Exudate Type: red, brown red, brown red, brown Exudate Color: No No No Foul Odor A Cleansing: fter N/A N/A N/A Odor A nticipated Due to Product Use: Flat and Intact Flat and Intact Flat and Intact Wound Margin: Small (1-33%) Small (1-33%) None Present (0%) Granulation A mount: N/A Red N/A Granulation Quality: Large (67-100%) Large (67-100%) Large (67-100%) Necrotic A mount: Eschar Eschar Adherent Slough Necrotic Tissue: Fat Layer (Subcutaneous Tissue): Yes Fat Layer (Subcutaneous Tissue): Yes Fat Layer (Subcutaneous Tissue): Yes Exposed Structures: Fascia: No Fascia: No Fascia: No Tendon: No Tendon: No Tendon: No Muscle: No Muscle: No Muscle: No Joint: No Joint: No Joint: No Bone: No Bone: No Bone: No None None None Epithelialization: Debridement - Excisional Debridement - Excisional N/A Debridement: Pre-procedure Verification/Time Out 15:24 15:24 N/A Taken: Other Other N/A Pain Control: Subcutaneous Subcutaneous N/A Tissue Debrided: Skin/Subcutaneous Tissue Skin/Subcutaneous Tissue N/A Level: 0.81 10 N/A Debridement A (sq cm): rea Curette Curette N/A Instrument: Minimum Minimum N/A Bleeding: Pressure Pressure N/A Hemostasis A chieved: Procedure was tolerated well Procedure was tolerated well N/A Debridement Treatment Response: 0.9x0.9x0.1 8.8x2.2x0.3 N/A Post Debridement Measurements L x W x D (cm) 0.064 4.562 N/A Post Debridement  Volume: (cm) Unstageable/Unclassified Unstageable/Unclassified N/A Post Debridement Stage: N/A N/A N/A Assessment Notes: Debridement Debridement N/A Procedures Performed: Wound Number: 9 N/A N/A Photos: No Photos N/A N/A Right, Lateral T Great oe N/A N/A Wound Location: Pressure Injury N/A N/A Wounding Event: Pressure Ulcer N/A N/A Primary Etiology: Cataracts, Glaucoma, Hypertension, N/A N/A Comorbid History: Lupus Erythematosus, Osteoarthritis, Dementia 07/29/2020 N/A N/A Date Acquired: 1 N/A N/A Weeks of Treatment: Open N/A N/A Wound Status: 0.6x1x0.1 N/A N/A Measurements L x W x D (cm) 0.471 N/A N/A A (cm) : rea 0.047 N/A N/A Volume (cm) : 4.80% N/A N/A % Reduction in A rea: 52.50% N/A N/A % Reduction in Volume: Unstageable/Unclassified N/A N/A Classification: Medium N/A N/A Exudate A mount: Serosanguineous N/A N/A Exudate Type: red, brown N/A N/A Exudate Color: No N/A N/A Foul Odor A Cleansing: fter N/A N/A N/A Odor A nticipated Due to Product Use: Flat and Intact N/A N/A Wound Margin: None Present (0%) N/A N/A Granulation Amount: N/A N/A N/A Granulation Quality: Large (67-100%) N/A N/A Necrotic Amount: N/A N/A N/A Necrotic Tissue: Fat Layer (Subcutaneous Tissue): Yes N/A N/A Exposed Structures: Fascia: No Tendon: No Muscle: No Joint: No Bone: No None N/A N/A Epithelialization: N/A N/A N/A Debridement: N/A N/A N/A Pain Control: N/A N/A N/A Tissue Debrided: N/A N/A N/A Level: N/A N/A N/A Debridement A (sq cm): rea N/A N/A N/A Instrument: N/A N/A N/A Bleeding: N/A N/A N/A Hemostasis A chieved: Debridement Treatment Response: N/A N/A N/A Post Debridement Measurements L x N/A N/A N/A W x D (cm) N/A N/A N/A Post Debridement Volume: (cm) N/A N/A N/A Post Debridement Stage: N/A N/A N/A Assessment Notes: N/A N/A N/A Procedures Performed: Treatment Notes Electronic Signature(s) Signed: 11/05/2020 7:08:24 PM By: Lorrin Jackson Signed: 11/06/2020  1:36:13 PM By: Linton Ham MD Entered By: Linton Ham on 11/05/2020 15:46:52 -------------------------------------------------------------------------------- Multi-Disciplinary Care Plan Details Patient Name: Date of Service: Tanya Jensen, Tanya Hay. 11/05/2020 1:45 PM Medical Record Number: [id number] Patient Account Number: [id number] Date of Birth/Sex: Treating RN: [birth year] (75 y.o. Sue Lush Primary Care Davinci Glotfelty: Shirline Frees Other Clinician: Referring Orie Baxendale: Treating Shilo Philipson/Extender: Ponciano Ort in Treatment: 1 Active Inactive Electronic Signature(s) Signed: 12/09/2020 5:54:08 PM By: Lorrin Jackson Signed: 01/30/2021 4:51:27 PM By: Levan Hurst RN, BSN Previous Signature: 11/05/2020 7:08:24 PM Version By: Lorrin Jackson Entered By: Levan Hurst on 12/09/2020 12:57:27 -------------------------------------------------------------------------------- Pain Assessment Details Patient Name: Date of Service: Tanya Sellers NCY L. 11/05/2020 1:45 PM Medical Record Number: [id number] Patient Account Number: [id number] Date of Birth/Sex: Treating RN: [birth year] (75 y.o. Sue Lush Primary Care Risa Auman: Shirline Frees Other Clinician: Referring Ambur Province: Treating Brienna Bass/Extender: Ponciano Ort in Treatment: 1 Active Problems Location of Pain Severity and Description of Pain Patient Has Paino No Site Locations Pain Management and Medication Current Pain Management: Electronic Signature(s) Signed: 11/05/2020 3:40:40 PM By: Sandre Kitty Signed: 11/05/2020 7:08:24 PM By: Lorrin Jackson Entered By: Sandre Kitty on 11/05/2020 14:09:18 -------------------------------------------------------------------------------- Patient/Caregiver Education Details Patient Name: Date of Service: Tanya Jensen 7/5/2022andnbsp1:45 PM Medical Record Number: [id number] Patient Account Number:  [id number] Date of Birth/Gender: Treating RN: 07-29-1945 (75 y.o. Sue Lush Primary Care Physician: Shirline Frees Other Clinician: Referring Physician: Treating Physician/Extender: Ponciano Ort in Treatment: 1 Education Assessment Education Provided To: Caregiver Education Topics Provided Infection: Methods: Explain/Verbal Responses: State content correctly Pressure: Methods: Explain/Verbal, Printed Responses: State content correctly Wound Debridement: Methods: Explain/Verbal Responses: State content correctly Wound/Skin Impairment: Methods: Explain/Verbal, Printed Responses: State content correctly Electronic Signature(s) Signed: 11/05/2020 7:08:24 PM By: Lorrin Jackson Entered By: Lorrin Jackson on 11/05/2020 15:30:15 -------------------------------------------------------------------------------- Wound Assessment Details Patient Name: Date of Service: Tanya Ou L. 11/05/2020 1:45 PM Medical Record Number: [id number] Patient Account Number: [id number] Date of Birth/Sex: Treating RN: Feb 14, 1946 (75 y.o. Sue Lush Primary Care Rashay Barnette: Shirline Frees Other Clinician: Referring Charliene Inoue: Treating Leibish Mcgregor/Extender: Ponciano Ort in Treatment: 1 Wound Status Wound Number: 1 Primary Pressure Ulcer Etiology: Wound Location: Sacrum Wound Open Wounding Event: Pressure Injury Status: Date Acquired: 07/29/2020 Comorbid Cataracts, Glaucoma, Hypertension, Lupus Erythematosus, Weeks Of Treatment: 1 History: Osteoarthritis, Dementia Clustered Wound: No Photos Wound Measurements Length: (cm) 8.5 Width: (cm) 8 Depth: (cm) 3.5 Area: (cm) 53.407 Volume: (cm) 186.925 % Reduction in Area: 59.4% % Reduction in Volume: -58% Epithelialization: None Wound Description Classification: Category/Stage IV Wound Margin: Flat and Intact Exudate Amount: Large Exudate Type: Serosanguineous Exudate Color: red,  brown Foul Odor After Cleansing: Yes Due to Product Use: No Slough/Fibrino Yes Wound Bed Granulation Amount: Small (1-33%) Exposed Structure Granulation Quality: Red Fascia Exposed: No Necrotic Amount: Medium (34-66%) Fat Layer (Subcutaneous Tissue) Exposed: Yes Necrotic Quality: Eschar, Adherent Slough Tendon Exposed: No Muscle Exposed: Yes Necrosis of Muscle: Yes Joint Exposed: No Bone Exposed: No Electronic Signature(s) Signed: 11/06/2020 5:22:20 PM By: Sandre Kitty Signed: 11/06/2020 5:24:26 PM By: Lorrin Jackson Previous Signature: 11/05/2020 6:17:43 PM Version By: Leane Call Previous Signature: 11/05/2020 6:17:43 PM Version By: Leane Call Previous Signature: 11/05/2020 7:08:24 PM Version By: Lorrin Jackson Entered By: Sandre Kitty on 11/06/2020 16:30:59 -------------------------------------------------------------------------------- Wound Assessment Details Patient Name: Date of Service: Tanya Sellers NCY L. 11/05/2020 1:45 PM Medical Record Number: [id number] Patient Account Number: [id number] Date of Birth/Sex: Treating RN: 31-Dec-1945 (  75 y.o. Sue Lush Primary Care Elysia Grand: Shirline Frees Other Clinician: Referring Ethanael Veith: Treating Tamel Abel/Extender: Ponciano Ort in Treatment: 1 Wound Status Wound Number: 10 Primary Pressure Ulcer Etiology: Wound Location: Right, Distal T Great oe Wound Open Wounding Event: Pressure Injury Status: Date Acquired: 07/29/2020 Comorbid Cataracts, Glaucoma, Hypertension, Lupus Erythematosus, Weeks Of Treatment: 1 History: Osteoarthritis, Dementia Clustered Wound: No Photos Photo Uploaded By: Sandre Kitty on 11/06/2020 16:26:40 Wound Measurements Length: (cm) 1.5 Width: (cm) 1.3 Depth: (cm) 0.1 Area: (cm) 1.532 Volume: (cm) 0.153 % Reduction in Area: -28.3% % Reduction in Volume: -28.6% Epithelialization: None Tunneling: No Undermining: No Wound  Description Classification: Unstageable/Unclassified Wound Margin: Indistinct, nonvisible Exudate Amount: Medium Exudate Type: Serosanguineous Exudate Color: red, brown Foul Odor After Cleansing: No Slough/Fibrino Yes Wound Bed Granulation Amount: None Present (0%) Exposed Structure Necrotic Amount: Large (67-100%) Fascia Exposed: No Necrotic Quality: Eschar Fat Layer (Subcutaneous Tissue) Exposed: Yes Tendon Exposed: No Muscle Exposed: No Joint Exposed: No Bone Exposed: No Electronic Signature(s) Signed: 11/05/2020 6:17:43 PM By: Leane Call Signed: 11/05/2020 7:08:24 PM By: Lorrin Jackson Entered By: Leane Call on 11/05/2020 14:49:30 -------------------------------------------------------------------------------- Wound Assessment Details Patient Name: Date of Service: Tanya Ou L. 11/05/2020 1:45 PM Medical Record Number: [id number] Patient Account Number: [id number] Date of Birth/Sex: Treating RN: 1946-04-16 (75 y.o. Sue Lush Primary Care Rylon Poitra: Shirline Frees Other Clinician: Referring Kellon Chalk: Treating Patrena Santalucia/Extender: Ponciano Ort in Treatment: 1 Wound Status Wound Number: 2 Primary Pressure Ulcer Etiology: Wound Location: Left Trochanter Wound Open Wounding Event: Pressure Injury Status: Date Acquired: 07/29/2020 Comorbid Cataracts, Glaucoma, Hypertension, Lupus Erythematosus, Weeks Of Treatment: 1 History: Osteoarthritis, Dementia Clustered Wound: No Photos Wound Measurements Length: (cm) 1.9 Width: (cm) 1.1 Depth: (cm) 2 Area: (cm) 1.641 Volume: (cm) 3.283 % Reduction in Area: 5% % Reduction in Volume: -26.7% Epithelialization: None Tunneling: No Undermining: No Wound Description Classification: Category/Stage IV Wound Margin: Thickened Exudate Amount: Large Exudate Type: Purulent Exudate Color: yellow, brown, green Foul Odor After Cleansing: Yes Due to Product Use: No Slough/Fibrino  Yes Wound Bed Granulation Amount: Small (1-33%) Exposed Structure Granulation Quality: Red Fascia Exposed: No Necrotic Amount: Large (67-100%) Fat Layer (Subcutaneous Tissue) Exposed: Yes Tendon Exposed: No Muscle Exposed: Yes Necrosis of Muscle: Yes Joint Exposed: No Bone Exposed: No Electronic Signature(s) Signed: 11/06/2020 5:22:20 PM By: Sandre Kitty Signed: 11/06/2020 5:24:26 PM By: Lorrin Jackson Previous Signature: 11/05/2020 6:17:43 PM Version By: Leane Call Previous Signature: 11/05/2020 7:08:24 PM Version By: Lorrin Jackson Entered By: Sandre Kitty on 11/06/2020 16:30:36 -------------------------------------------------------------------------------- Wound Assessment Details Patient Name: Date of Service: Tanya Ou L. 11/05/2020 1:45 PM Medical Record Number: [id number] Patient Account Number: [id number] Date of Birth/Sex: Treating RN: May 25, 1945 (75 y.o. Sue Lush Primary Care Arthelia Callicott: Shirline Frees Other Clinician: Referring Youssef Footman: Treating Rien Marland/Extender: Ponciano Ort in Treatment: 1 Wound Status Wound Number: 3 Primary Pressure Ulcer Etiology: Wound Location: Right, Lateral Malleolus Wound Open Wounding Event: Pressure Injury Status: Date Acquired: 07/29/2020 Comorbid Cataracts, Glaucoma, Hypertension, Lupus Erythematosus, Weeks Of Treatment: 1 History: Osteoarthritis, Dementia Clustered Wound: No Photos Wound Measurements Length: (cm) 1.5 Width: (cm) 1.3 Depth: (cm) 0.1 Area: (cm) 1.532 Volume: (cm) 0.153 % Reduction in Area: 30.3% % Reduction in Volume: 30.5% Epithelialization: None Tunneling: No Undermining: No Wound Description Classification: Unstageable/Unclassified Wound Margin: Distinct, outline attached Exudate Amount: Medium Exudate Type: Serosanguineous Exudate Color: red, brown Foul Odor After Cleansing: No Slough/Fibrino Yes Wound Bed Granulation Amount: None Present (0%)  Exposed Structure  Necrotic Amount: Large (67-100%) Fascia Exposed: No Necrotic Quality: Eschar, Adherent Slough Fat Layer (Subcutaneous Tissue) Exposed: Yes Tendon Exposed: No Muscle Exposed: No Joint Exposed: No Bone Exposed: No Electronic Signature(s) Signed: 11/06/2020 5:22:20 PM By: Sandre Kitty Signed: 11/06/2020 5:24:26 PM By: Lorrin Jackson Previous Signature: 11/05/2020 6:17:43 PM Version By: Leane Call Previous Signature: 11/05/2020 7:08:24 PM Version By: Lorrin Jackson Entered By: Sandre Kitty on 11/06/2020 16:25:21 -------------------------------------------------------------------------------- Wound Assessment Details Patient Name: Date of Service: Tanya Ou L. 11/05/2020 1:45 PM Medical Record Number: [id number] Patient Account Number: [id number] Date of Birth/Sex: Treating RN: Jun 28, 1945 (75 y.o. Sue Lush Primary Care Autumnrose Yore: Shirline Frees Other Clinician: Referring Xavi Tomasik: Treating Rosemae Mcquown/Extender: Ponciano Ort in Treatment: 1 Wound Status Wound Number: 4 Primary Pressure Ulcer Etiology: Wound Location: Right Calcaneus Wound Open Wounding Event: Pressure Injury Status: Date Acquired: 08/29/2020 Comorbid Cataracts, Glaucoma, Hypertension, Lupus Erythematosus, Weeks Of Treatment: 1 History: Osteoarthritis, Dementia Clustered Wound: No Photos Wound Measurements Length: (cm) 4 Width: (cm) 1.5 Depth: (cm) 1 Area: (cm) 4.712 Volume: (cm) 4.712 % Reduction in Area: 25% % Reduction in Volume: 16.7% Epithelialization: None Wound Description Classification: Unstageable/Unclassified Wound Margin: Fibrotic scar, thickened scar Exudate Amount: Medium Exudate Type: Serosanguineous Exudate Color: red, brown Foul Odor After Cleansing: No Slough/Fibrino Yes Wound Bed Granulation Amount: Small (1-33%) Exposed Structure Granulation Quality: Red Fascia Exposed: No Necrotic Amount: Large (67-100%) Fat Layer  (Subcutaneous Tissue) Exposed: Yes Necrotic Quality: Eschar, Adherent Slough Tendon Exposed: No Muscle Exposed: No Joint Exposed: No Bone Exposed: No Electronic Signature(s) Signed: 11/06/2020 5:22:20 PM By: Sandre Kitty Signed: 11/06/2020 5:24:26 PM By: Lorrin Jackson Previous Signature: 11/05/2020 3:40:40 PM Version By: Sandre Kitty Previous Signature: 11/05/2020 7:08:24 PM Version By: Lorrin Jackson Entered By: Sandre Kitty on 11/06/2020 16:28:10 -------------------------------------------------------------------------------- Wound Assessment Details Patient Name: Date of Service: Tanya Ou L. 11/05/2020 1:45 PM Medical Record Number: [id number] Patient Account Number: [id number] Date of Birth/Sex: Treating RN: [birth year] (75 y.o. Sue Lush Primary Care Alicen Donalson: Shirline Frees Other Clinician: Referring Logon Uttech: Treating Shealeigh Dunstan/Extender: Ponciano Ort in Treatment: 1 Wound Status Wound Number: 5 Primary Pressure Ulcer Etiology: Wound Location: Left Calcaneus Wound Open Wounding Event: Pressure Injury Status: Date Acquired: 07/29/2020 Comorbid Cataracts, Glaucoma, Hypertension, Lupus Erythematosus, Weeks Of Treatment: 1 History: Osteoarthritis, Dementia Clustered Wound: No Photos Wound Measurements Length: (cm) 3 Width: (cm) 4.1 Depth: (cm) 0.1 Area: (cm) 9.66 Volume: (cm) 0.966 % Reduction in Area: 67.2% % Reduction in Volume: 67.2% Epithelialization: None Tunneling: No Undermining: No Wound Description Classification: Category/Stage II Wound Margin: Indistinct, nonvisible Exudate Amount: Medium Exudate Type: Serosanguineous Exudate Color: red, brown Foul Odor After Cleansing: No Slough/Fibrino No Wound Bed Granulation Amount: None Present (0%) Exposed Structure Necrotic Amount: None Present (0%) Fascia Exposed: No Fat Layer (Subcutaneous Tissue) Exposed: No Tendon Exposed: No Muscle Exposed: No Joint  Exposed: No Bone Exposed: No Assessment Notes intact blister remains closed Electronic Signature(s) Signed: 11/06/2020 5:22:20 PM By: Sandre Kitty Signed: 11/06/2020 5:24:26 PM By: Lorrin Jackson Previous Signature: 11/05/2020 6:17:43 PM Version By: Leane Call Previous Signature: 11/05/2020 7:08:24 PM Version By: Lorrin Jackson Entered By: Sandre Kitty on 11/06/2020 16:29:53 -------------------------------------------------------------------------------- Wound Assessment Details Patient Name: Date of Service: Tanya Ou L. 11/05/2020 1:45 PM Medical Record Number: [id number] Patient Account Number: [id number] Date of Birth/Sex: Treating RN: [birth year] (75 y.o. Sue Lush Primary Care Warrick Llera: Shirline Frees Other Clinician: Referring Cybill Uriegas: Treating Briannah Lona/Extender: Ponciano Ort in Treatment: 1 Wound Status Wound  Number: 6 Primary Pressure Ulcer Etiology: Wound Location: Left, Medial Foot Wound Open Wounding Event: Pressure Injury Status: Date Acquired: 07/29/2020 Comorbid Cataracts, Glaucoma, Hypertension, Lupus Erythematosus, Weeks Of Treatment: 1 History: Osteoarthritis, Dementia Clustered Wound: No Photos Wound Measurements Length: (cm) 0.9 Width: (cm) 0.9 Depth: (cm) 0.1 Area: (cm) 0.636 Volume: (cm) 0.064 % Reduction in Area: -65.2% % Reduction in Volume: -68.4% Epithelialization: None Tunneling: No Undermining: No Wound Description Classification: Unstageable/Unclassified Wound Margin: Flat and Intact Exudate Amount: Medium Exudate Type: Serosanguineous Exudate Color: red, brown Foul Odor After Cleansing: No Slough/Fibrino Yes Wound Bed Granulation Amount: Small (1-33%) Exposed Structure Necrotic Amount: Large (67-100%) Fascia Exposed: No Necrotic Quality: Eschar Fat Layer (Subcutaneous Tissue) Exposed: Yes Tendon Exposed: No Muscle Exposed: No Joint Exposed: No Bone Exposed: No Electronic  Signature(s) Signed: 11/06/2020 5:22:20 PM By: Sandre Kitty Signed: 11/06/2020 5:24:26 PM By: Lorrin Jackson Previous Signature: 11/05/2020 6:17:43 PM Version By: Leane Call Previous Signature: 11/05/2020 7:08:24 PM Version By: Lorrin Jackson Entered By: Sandre Kitty on 11/06/2020 16:29:34 -------------------------------------------------------------------------------- Wound Assessment Details Patient Name: Date of Service: Tanya Ou L. 11/05/2020 1:45 PM Medical Record Number: [id number] Patient Account Number: [id number] Date of Birth/Sex: Treating RN: 06/26/1945 (75 y.o. Sue Lush Primary Care Maksymilian Mabey: Shirline Frees Other Clinician: Referring Samba Cumba: Treating Nyisha Clippard/Extender: Ponciano Ort in Treatment: 1 Wound Status Wound Number: 7 Primary Pressure Ulcer Etiology: Wound Location: Left, Lateral Malleolus Wound Open Wounding Event: Pressure Injury Status: Date Acquired: 07/29/2020 Comorbid Cataracts, Glaucoma, Hypertension, Lupus Erythematosus, Weeks Of Treatment: 1 History: Osteoarthritis, Dementia Clustered Wound: No Photos Wound Measurements Length: (cm) 8.8 Width: (cm) 2.2 Depth: (cm) 0.3 Area: (cm) 15.205 Volume: (cm) 4.562 % Reduction in Area: -29.8% % Reduction in Volume: -94.8% Epithelialization: None Tunneling: No Undermining: No Wound Description Classification: Unstageable/Unclassified Wound Margin: Flat and Intact Exudate Amount: Medium Exudate Type: Serosanguineous Exudate Color: red, brown Foul Odor After Cleansing: No Slough/Fibrino Yes Wound Bed Granulation Amount: Small (1-33%) Exposed Structure Granulation Quality: Red Fascia Exposed: No Necrotic Amount: Large (67-100%) Fat Layer (Subcutaneous Tissue) Exposed: Yes Necrotic Quality: Eschar Tendon Exposed: No Muscle Exposed: No Joint Exposed: No Bone Exposed: No Electronic Signature(s) Signed: 11/06/2020 5:22:20 PM By: Sandre Kitty Signed: 11/06/2020 5:24:26 PM By: Lorrin Jackson Previous Signature: 11/05/2020 6:17:43 PM Version By: Leane Call Previous Signature: 11/05/2020 7:08:24 PM Version By: Lorrin Jackson Entered By: Sandre Kitty on 11/06/2020 16:30:16 -------------------------------------------------------------------------------- Wound Assessment Details Patient Name: Date of Service: Tanya Ou L. 11/05/2020 1:45 PM Medical Record Number: [id number] Patient Account Number: [id number] Date of Birth/Sex: Treating RN: 1946-01-18 (75 y.o. Sue Lush Primary Care Valkyrie Guardiola: Shirline Frees Other Clinician: Referring Corry Storie: Treating Tanaisha Pittman/Extender: Ponciano Ort in Treatment: 1 Wound Status Wound Number: 8 Primary Pressure Ulcer Etiology: Wound Location: Left T Second oe Wound Open Wounding Event: Pressure Injury Status: Date Acquired: 07/29/2020 Comorbid Cataracts, Glaucoma, Hypertension, Lupus Erythematosus, Weeks Of Treatment: 1 History: Osteoarthritis, Dementia Clustered Wound: No Photos Wound Measurements Length: (cm) 0.3 Width: (cm) 0.4 Depth: (cm) 0.1 Area: (cm) 0.094 Volume: (cm) 0.009 % Reduction in Area: 73.4% % Reduction in Volume: 74.3% Epithelialization: None Tunneling: No Undermining: No Wound Description Classification: Unstageable/Unclassified Wound Margin: Flat and Intact Exudate Amount: Medium Exudate Type: Serosanguineous Exudate Color: red, brown Foul Odor After Cleansing: No Slough/Fibrino Yes Wound Bed Granulation Amount: None Present (0%) Exposed Structure Necrotic Amount: Large (67-100%) Fascia Exposed: No Necrotic Quality: Adherent Slough Fat Layer (Subcutaneous Tissue) Exposed: Yes Tendon Exposed: No Muscle Exposed: No Joint  Exposed: No Bone Exposed: No Electronic Signature(s) Signed: 11/06/2020 5:22:20 PM By: Sandre Kitty Signed: 11/06/2020 5:24:26 PM By: Lorrin Jackson Previous Signature: 11/05/2020  6:17:43 PM Version By: Leane Call Previous Signature: 11/05/2020 7:08:24 PM Version By: Lorrin Jackson Entered By: Sandre Kitty on 11/06/2020 16:29:08 -------------------------------------------------------------------------------- Wound Assessment Details Patient Name: Date of Service: Tanya Jensen. 11/05/2020 1:45 PM Medical Record Number: [id number] Patient Account Number: [id number] Date of Birth/Sex: Treating RN: Apr 09, 1946 (75 y.o. Sue Lush Primary Care Lus Kriegel: Shirline Frees Other Clinician: Referring Tanush Drees: Treating Jeena Arnett/Extender: Ponciano Ort in Treatment: 1 Wound Status Wound Number: 9 Primary Pressure Ulcer Etiology: Wound Location: Right, Lateral T Great oe Wound Open Wounding Event: Pressure Injury Status: Date Acquired: 07/29/2020 Comorbid Cataracts, Glaucoma, Hypertension, Lupus Erythematosus, Weeks Of Treatment: 1 History: Osteoarthritis, Dementia Clustered Wound: No Photos Wound Measurements Length: (cm) 0.6 Width: (cm) 1 Depth: (cm) 0.1 Area: (cm) 0.471 Volume: (cm) 0.047 % Reduction in Area: 4.8% % Reduction in Volume: 52.5% Epithelialization: None Tunneling: No Undermining: No Wound Description Classification: Unstageable/Unclassified Wound Margin: Flat and Intact Exudate Amount: Medium Exudate Type: Serosanguineous Exudate Color: red, brown Foul Odor After Cleansing: No Slough/Fibrino Yes Wound Bed Granulation Amount: None Present (0%) Exposed Structure Necrotic Amount: Large (67-100%) Fascia Exposed: No Fat Layer (Subcutaneous Tissue) Exposed: Yes Tendon Exposed: No Muscle Exposed: No Joint Exposed: No Bone Exposed: No Electronic Signature(s) Signed: 11/06/2020 5:22:20 PM By: Sandre Kitty Signed: 11/06/2020 5:24:26 PM By: Lorrin Jackson Previous Signature: 11/05/2020 6:17:43 PM Version By: Leane Call Previous Signature: 11/05/2020 7:08:24 PM Version By: Lorrin Jackson Entered  By: Sandre Kitty on 11/06/2020 16:28:45 -------------------------------------------------------------------------------- Ingenio Details Patient Name: Date of Service: Tanya Jensen, Tanya NCY L. 11/05/2020 1:45 PM Medical Record Number: [id number] Patient Account Number: [id number] Date of Birth/Sex: Treating RN: Jun 30, 1945 (75 y.o. Sue Lush Primary Care Eather Chaires: Shirline Frees Other Clinician: Referring Aleisa Howk: Treating Trenita Hulme/Extender: Ponciano Ort in Treatment: 1 Vital Signs Time Taken: 14:08 Temperature (F): 98.2 Height (in): 67 Pulse (bpm): 69 Respiratory Rate (breaths/min): 18 Blood Pressure (mmHg): 120/85 Reference Range: 80 - 120 mg / dl Electronic Signature(s) Signed: 11/05/2020 3:40:40 PM By: Sandre Kitty Entered By: Sandre Kitty on 11/05/2020 14:09:10

## 2020-11-06 NOTE — Progress Notes (Signed)
Tanya Jensen, Tanya Jensen ([id number]) Visit Report for 11/05/2020 Debridement Details Patient Name: Date of Service: Tanya Jensen 11/05/2020 1:45 PM Medical Record Number: [id number] Patient Account Number: [id number] Date of Birth/Sex: Treating RN: Oct 28, 1945 (75 y.o. Sue Lush Primary Care Provider: Shirline Frees Other Clinician: Referring Provider: Treating Provider/Extender: Ponciano Ort in Treatment: 1 Debridement Performed for Assessment: Wound #1 Sacrum Performed By: Physician Ricard Dillon., MD Debridement Type: Debridement Level of Consciousness (Pre-procedure): Awake and Alert Pre-procedure Verification/Time Out Yes - 15:24 Taken: Start Time: 15:25 Pain Control: Other : Benzocaine T Area Debrided (L x W): otal 5 (cm) x 4 (cm) = 20 (cm) Tissue and other material debrided: Non-Viable, Subcutaneous Level: Skin/Subcutaneous Tissue Debridement Description: Excisional Instrument: Forceps, Scissors Bleeding: Minimum Hemostasis Achieved: Pressure End Time: 15:30 Response to Treatment: Procedure was tolerated well Level of Consciousness (Post- Awake and Alert procedure): Post Debridement Measurements of Total Wound Length: (cm) 8.5 Stage: Category/Stage IV Width: (cm) 8 Depth: (cm) 3.5 Volume: (cm) 186.925 Character of Wound/Ulcer Post Debridement: Stable Post Procedure Diagnosis Same as Pre-procedure Electronic Signature(s) Signed: 11/05/2020 7:08:24 PM By: Lorrin Jackson Signed: 11/06/2020 1:36:13 PM By: Linton Ham MD Entered By: Linton Ham on 11/05/2020 15:52:44 -------------------------------------------------------------------------------- Debridement Details Patient Name: Date of Service: Tanya Ou L. 11/05/2020 1:45 PM Medical Record Number: [id number] Patient Account Number: [id number] Date of Birth/Sex: Treating RN: [birth year] (75 y.o. Sue Lush Primary Care Provider: Shirline Frees Other  Clinician: Referring Provider: Treating Provider/Extender: Ponciano Ort in Treatment: 1 Debridement Performed for Assessment: Wound #4 Right Calcaneus Performed By: Physician Ricard Dillon., MD Debridement Type: Debridement Level of Consciousness (Pre-procedure): Awake and Alert Pre-procedure Verification/Time Out Yes - 15:24 Yes - 15:24 Taken: Start Time: 15:32 Pain Control: Other : Benzocaine T Area Debrided (L x W): otal 4 (cm) x 1.5 (cm) = 6 (cm) Tissue and other material debrided: Non-Viable, Subcutaneous Level: Skin/Subcutaneous Tissue Debridement Description: Excisional Instrument: Curette Bleeding: Minimum Hemostasis Achieved: Pressure End Time: 15:35 Response to Treatment: Procedure was tolerated well Level of Consciousness (Post- Awake and Alert procedure): Post Debridement Measurements of Total Wound Length: (cm) 4 Stage: Unstageable/Unclassified Width: (cm) 1.5 Depth: (cm) 1 Volume: (cm) 4.712 Character of Wound/Ulcer Post Debridement: Stable Post Procedure Diagnosis Same as Pre-procedure Electronic Signature(s) Signed: 11/05/2020 7:08:24 PM By: Lorrin Jackson Signed: 11/06/2020 1:36:13 PM By: Linton Ham MD Entered By: Linton Ham on 11/05/2020 15:52:58 -------------------------------------------------------------------------------- Debridement Details Patient Name: Date of Service: Tanya Ou L. 11/05/2020 1:45 PM Medical Record Number: [id number] Patient Account Number: [id number] Date of Birth/Sex: Treating RN: [birth year] (75 y.o. Sue Lush Primary Care Provider: Shirline Frees Other Clinician: Referring Provider: Treating Provider/Extender: Ponciano Ort in Treatment: 1 Debridement Performed for Assessment: Wound #6 Left,Medial Foot Performed By: Physician Ricard Dillon., MD Debridement Type: Debridement Level of Consciousness (Pre-procedure): Awake and  Alert Pre-procedure Verification/Time Out Yes - 15:24 Taken: Start Time: 15:35 Pain Control: Other : Benzocaine T Area Debrided (L x W): otal 0.9 (cm) x 0.9 (cm) = 0.81 (cm) Tissue and other material debrided: Non-Viable, Subcutaneous Level: Skin/Subcutaneous Tissue Debridement Description: Excisional Instrument: Curette Bleeding: Minimum Hemostasis Achieved: Pressure End Time: 15:37 Response to Treatment: Procedure was tolerated well Level of Consciousness (Post- Awake and Alert procedure): Post Debridement Measurements of Total Wound Length: (cm) 0.9 Stage: Unstageable/Unclassified Width: (cm) 0.9 Depth: (cm) 0.1 Volume: (cm) 0.064 Character of Wound/Ulcer Post Debridement: Stable Post Procedure Diagnosis Same as Pre-procedure Electronic Signature(s)  Signed: 11/05/2020 7:08:24 PM By: Lorrin Jackson Signed: 11/06/2020 1:36:13 PM By: Linton Ham MD Entered By: Linton Ham on 11/05/2020 15:53:08 -------------------------------------------------------------------------------- Debridement Details Patient Name: Date of Service: Tanya Jensen. 11/05/2020 1:45 PM Medical Record Number: [id number] Patient Account Number: [id number] Date of Birth/Sex: Treating RN: 10-25-1945 (75 y.o. Sue Lush Primary Care Provider: Shirline Frees Other Clinician: Referring Provider: Treating Provider/Extender: Ponciano Ort in Treatment: 1 Debridement Performed for Assessment: Wound #7 Left,Lateral Malleolus Performed By: Physician Ricard Dillon., MD Debridement Type: Debridement Level of Consciousness (Pre-procedure): Awake and Alert Pre-procedure Verification/Time Out Yes - 15:24 Taken: Start Time: 15:30 Pain Control: Other : Benzocaine T Area Debrided (L x W): otal 5 (cm) x 2 (cm) = 10 (cm) Tissue and other material debrided: Non-Viable, Subcutaneous Level: Skin/Subcutaneous Tissue Debridement Description: Excisional Instrument:  Curette Bleeding: Minimum Hemostasis Achieved: Pressure End Time: 15:32 Response to Treatment: Procedure was tolerated well Level of Consciousness (Post- Awake and Alert procedure): Post Debridement Measurements of Total Wound Length: (cm) 8.8 Stage: Unstageable/Unclassified Width: (cm) 2.2 Depth: (cm) 0.3 Volume: (cm) 4.562 Character of Wound/Ulcer Post Debridement: Stable Post Procedure Diagnosis Same as Pre-procedure Electronic Signature(s) Signed: 11/05/2020 7:08:24 PM By: Lorrin Jackson Signed: 11/06/2020 1:36:13 PM By: Linton Ham MD Entered By: Linton Ham on 11/05/2020 15:53:33 -------------------------------------------------------------------------------- HPI Details Patient Name: Date of Service: Tanya Jensen, NA NCY L. 11/05/2020 1:45 PM Medical Record Number: [id number] Patient Account Number: [id number] Date of Birth/Sex: Treating RN: Sep 20, 1945 (75 y.o. Sue Lush Primary Care Provider: Shirline Frees Other Clinician: Referring Provider: Treating Provider/Extender: Ponciano Ort in Treatment: 1 History of Present Illness HPI Description: ADMISSION 10/29/2020 This is a 75 year old woman who is very disabled secondary to advanced Lewy body dementia. Essentially bed and chair bound. She has 100% dependent on her husband for care and he accompanied her today. She also has advanced home care. She was she is here with multiple pressure areas some of them really serious; Major area on the left buttock and sacrum completely covered in necrotic surface Deep probing area over the left greater trochanter/hip. This goes well into the muscle layer although I did not feel bone. Right heel 2 small areas in close juxtaposition both of them necrotic although they do not connect Left heel blister/boggy Left medial foot blackened eschar over the fifth metatarsal base Left lateral malleolus also necrotic surface. Right first toe 2 small areas in  the left second toe also an open area. According to her husband they are using Medihoney although I am not exactly sure which product. They have ordered some form of pressure relief surface for her bed although I do not think they have it yet. They have also ordered a cushion for her wheelchair. She apparently eats well per the husband and there has not been any recent weight loss although she did have some weight loss he thinks it stabilized. She is on Ensure supplements. Past medical history includes Lewy body dementia, open angle glaucoma, lumbar spondylosis 11/05/2020; patient admitted to the clinic last week. She has multitude of chronic pressure ulcers in the setting of advanced dementia. Culture of the left hip that I did last time showed Streptococcus Constellatus and E. coli. Both of which should have been sensitive to the Augmentin. X-ray did not show any evidence of osteomyelitis but evaluation was limited by a large rectal stool impaction. We have been using silver alginate to all wounds. There is far too much dimension to her  surface area to consider alternative dressings at this point Electronic Signature(s) Signed: 11/06/2020 1:36:13 PM By: Linton Ham MD Entered By: Linton Ham on 11/05/2020 15:55:07 -------------------------------------------------------------------------------- Physical Exam Details Patient Name: Date of Service: Tanya Jensen. 11/05/2020 1:45 PM Medical Record Number: [id number] Patient Account Number: [id number] Date of Birth/Sex: Treating RN: 02-Sep-1945 (75 y.o. Sue Lush Primary Care Provider: Shirline Frees Other Clinician: Referring Provider: Treating Provider/Extender: Ponciano Ort in Treatment: 1 Constitutional Sitting or standing Blood Pressure is within target range for patient.. Pulse regular and within target range for patient.Marland Kitchen Respirations regular, non-labored and within target range.. Temperature is  normal and within the target range for the patient.Marland Kitchen Appears in no distress. Notes Wound exam; Major areas on the left buttock in close proximity to the sacrum. Completely necrotic surface on this I remove necrotic tissue with pickups and scissors this undoubtedly will go to bone The deep tunneling area over the left greater trochanter still has purulent looking material here. There is no way to debride this the orifice is simply too small. What I can visualize here does not look healthy. Also debridement of the left lateral ankle l and both areas on the right medial and right lateral heel. Using #5 curette's Left medial foot also extensively debrided. There is areas on the tips of her first and second toes on the left Electronic Signature(s) Signed: 11/06/2020 1:36:13 PM By: Linton Ham MD Entered By: Linton Ham on 11/05/2020 15:57:06 -------------------------------------------------------------------------------- Physician Orders Details Patient Name: Date of Service: Tanya Jensen. 11/05/2020 1:45 PM Medical Record Number: [id number] Patient Account Number: [id number] Date of Birth/Sex: Treating RN: 08-11-1945 (75 y.o. Sue Lush Primary Care Provider: Other Clinician: Shirline Frees Referring Provider: Treating Provider/Extender: Ponciano Ort in Treatment: 1 Verbal / Phone Orders: No Diagnosis Coding ICD-10 Coding Code Description G31.83 Dementia with Lewy bodies [phone] Pressure ulcer of left hip, stage 4 L89.150 Pressure ulcer of sacral region, unstageable L89.510 Pressure ulcer of right ankle, unstageable L89.614 Pressure ulcer of right heel, stage 4 L97.528 Non-pressure chronic ulcer of other part of left foot with other specified severity L89.520 Pressure ulcer of left ankle, unstageable L97.518 Non-pressure chronic ulcer of other part of right foot with other specified severity L97.528 Non-pressure chronic ulcer of other part  of left foot with other specified severity Follow-up Appointments ppointment in 1 week. - Dr. Dellia Nims Return A ****EXTRA TIME FOR HIGH ACUITY-10 WOUNDS**** Other: - 11/05/20:Refill of antibiotics will be sent to pharmacy Bathing/ Shower/ Hygiene May shower with protection but do not get wound dressing(s) wet. Off-Loading Turn and reposition every 2 hours Other: - Float heels Additional Orders / Instructions Follow Nutritious Diet Home Health No change in wound care orders this week; continue Home Health for wound care. May utilize formulary equivalent dressing for wound treatment orders unless otherwise specified. - Advanced to change Monday's, Wednesday's, Friday's Wound Treatment Wound #1 - Sacrum Cleanser: Wound Cleanser [facility]) Every Other Day/15 Days Discharge Instructions: Cleanse the wound with wound cleanser prior to applying a clean dressing using gauze sponges, not tissue or cotton balls. Peri-Wound Care: Zinc Oxide Ointment 30g tube [facility]) Every Other Day/15 Days Discharge Instructions: Apply Zinc Oxide to periwound with each dressing change Prim Dressing: KerraCel Ag Gelling Fiber Dressing, 4x5 in (silver alginate) (Home Health) Every Other Day/15 Days ary Discharge Instructions: Apply silver alginate to wound bed as instructed Secondary Dressing: Woven Gauze Sponge, Non-Sterile 4x4 in [facility]) Every  Other Day/15 Days Discharge Instructions: Apply over primary dressing as directed. Secondary Dressing: ABD Pad, 5x9 [facility]) Every Other Day/15 Days Discharge Instructions: Apply over primary dressing as directed. Secured With: 68M Medipore H Soft Cloth Surgical T 4 x 2 (in/yd) (Home Health) Every Other Day/15 Days ape Discharge Instructions: Secure dressing with tape as directed. Wound #10 - T Great oe Wound Laterality: Right, Distal Cleanser: Wound Cleanser (Home Health) Every Other Day/15 Days Discharge Instructions: Cleanse the wound with wound  cleanser prior to applying a clean dressing using gauze sponges, not tissue or cotton balls. Peri-Wound Care: Zinc Oxide Ointment 30g tube [facility]) Every Other Day/15 Days Discharge Instructions: Apply Zinc Oxide to periwound with each dressing change Prim Dressing: KerraCel Ag Gelling Fiber Dressing, 4x5 in (silver alginate) (Home Health) Every Other Day/15 Days ary Discharge Instructions: Apply silver alginate to wound bed as instructed Secondary Dressing: Woven Gauze Sponge, Non-Sterile 4x4 in [facility]) Every Other Day/15 Days Discharge Instructions: Apply over primary dressing as directed. Secondary Dressing: ABD Pad, 5x9 [facility]) Every Other Day/15 Days Discharge Instructions: Apply over primary dressing as directed. Secured With: [employer], 4.5x3.1 (in/yd) [facility]) Every Other Day/15 Days Discharge Instructions: Secure with Kerlix as directed. Secured With: 68M Medipore H Soft Cloth Surgical T 4 x 2 (in/yd) (Home Health) Every Other Day/15 Days ape Discharge Instructions: Secure dressing with tape as directed. Wound #2 - Trochanter Wound Laterality: Left Cleanser: Wound Cleanser (Home Health) Every Other Day/15 Days Discharge Instructions: Cleanse the wound with wound cleanser prior to applying a clean dressing using gauze sponges, not tissue or cotton balls. Peri-Wound Care: Zinc Oxide Ointment 30g tube [facility]) Every Other Day/15 Days Discharge Instructions: Apply Zinc Oxide to periwound with each dressing change Prim Dressing: KerraCel Ag Gelling Fiber Dressing, 4x5 in (silver alginate) (Home Health) Every Other Day/15 Days ary Discharge Instructions: Apply silver alginate to wound bed as instructed Secondary Dressing: Woven Gauze Sponge, Non-Sterile 4x4 in [facility]) Every Other Day/15 Days Discharge Instructions: Apply over primary dressing as directed. Secondary Dressing: ABD Pad, 5x9 [facility]) Every Other Day/15 Days Discharge  Instructions: Apply over primary dressing as directed. Secured With: 68M Medipore H Soft Cloth Surgical T 4 x 2 (in/yd) (Home Health) Every Other Day/15 Days ape Discharge Instructions: Secure dressing with tape as directed. Wound #3 - Malleolus Wound Laterality: Right, Lateral Cleanser: Wound Cleanser (Home Health) Every Other Day/15 Days Discharge Instructions: Cleanse the wound with wound cleanser prior to applying a clean dressing using gauze sponges, not tissue or cotton balls. Peri-Wound Care: Zinc Oxide Ointment 30g tube [facility]) Every Other Day/15 Days Discharge Instructions: Apply Zinc Oxide to periwound with each dressing change Prim Dressing: KerraCel Ag Gelling Fiber Dressing, 4x5 in (silver alginate) (Home Health) Every Other Day/15 Days ary Discharge Instructions: Apply silver alginate to wound bed as instructed Secondary Dressing: Woven Gauze Sponge, Non-Sterile 4x4 in [facility]) Every Other Day/15 Days Discharge Instructions: Apply over primary dressing as directed. Secondary Dressing: ABD Pad, 5x9 [facility]) Every Other Day/15 Days Discharge Instructions: Apply over primary dressing as directed. Secured With: [employer], 4.5x3.1 (in/yd) [facility]) Every Other Day/15 Days Discharge Instructions: Secure with Kerlix as directed. Secured With: 68M Medipore H Soft Cloth Surgical T 4 x 2 (in/yd) (Home Health) Every Other Day/15 Days ape Discharge Instructions: Secure dressing with tape as directed. Wound #4 - Calcaneus Wound Laterality: Right Cleanser: Wound Cleanser (Home Health) Every Other Day/15 Days Discharge Instructions: Cleanse  the wound with wound cleanser prior to applying a clean dressing using gauze sponges, not tissue or cotton balls. Peri-Wound Care: Zinc Oxide Ointment 30g tube [facility]) Every Other Day/15 Days Discharge Instructions: Apply Zinc Oxide to periwound with each dressing change Prim Dressing: KerraCel Ag Gelling Fiber Dressing,  4x5 in (silver alginate) (Home Health) Every Other Day/15 Days ary Discharge Instructions: Apply silver alginate to wound bed as instructed Secondary Dressing: Woven Gauze Sponge, Non-Sterile 4x4 in [facility]) Every Other Day/15 Days Discharge Instructions: Apply over primary dressing as directed. Secondary Dressing: ABD Pad, 5x9 [facility]) Every Other Day/15 Days Discharge Instructions: Apply over primary dressing as directed. Secured With: [employer], 4.5x3.1 (in/yd) [facility]) Every Other Day/15 Days Discharge Instructions: Secure with Kerlix as directed. Secured With: 72M Medipore H Soft Cloth Surgical T 4 x 2 (in/yd) (Home Health) Every Other Day/15 Days ape Discharge Instructions: Secure dressing with tape as directed. Wound #5 - Calcaneus Wound Laterality: Left Cleanser: Wound Cleanser (Home Health) Every Other Day/15 Days Discharge Instructions: Cleanse the wound with wound cleanser prior to applying a clean dressing using gauze sponges, not tissue or cotton balls. Peri-Wound Care: Zinc Oxide Ointment 30g tube [facility]) Every Other Day/15 Days Discharge Instructions: Apply Zinc Oxide to periwound with each dressing change Prim Dressing: KerraCel Ag Gelling Fiber Dressing, 4x5 in (silver alginate) (Home Health) Every Other Day/15 Days ary Discharge Instructions: Apply silver alginate to wound bed as instructed Secondary Dressing: Woven Gauze Sponge, Non-Sterile 4x4 in [facility]) Every Other Day/15 Days Discharge Instructions: Apply over primary dressing as directed. Secondary Dressing: ABD Pad, 5x9 [facility]) Every Other Day/15 Days Discharge Instructions: Apply over primary dressing as directed. Secured With: [employer], 4.5x3.1 (in/yd) [facility]) Every Other Day/15 Days Discharge Instructions: Secure with Kerlix as directed. Secured With: 72M Medipore H Soft Cloth Surgical T 4 x 2 (in/yd) (Home Health) Every Other Day/15 Days ape Discharge  Instructions: Secure dressing with tape as directed. Wound #6 - Foot Wound Laterality: Left, Medial Cleanser: Wound Cleanser (Home Health) Every Other Day/15 Days Discharge Instructions: Cleanse the wound with wound cleanser prior to applying a clean dressing using gauze sponges, not tissue or cotton balls. Peri-Wound Care: Zinc Oxide Ointment 30g tube [facility]) Every Other Day/15 Days Discharge Instructions: Apply Zinc Oxide to periwound with each dressing change Prim Dressing: KerraCel Ag Gelling Fiber Dressing, 4x5 in (silver alginate) (Home Health) Every Other Day/15 Days ary Discharge Instructions: Apply silver alginate to wound bed as instructed Secondary Dressing: Woven Gauze Sponge, Non-Sterile 4x4 in [facility]) Every Other Day/15 Days Discharge Instructions: Apply over primary dressing as directed. Secondary Dressing: ABD Pad, 5x9 [facility]) Every Other Day/15 Days Discharge Instructions: Apply over primary dressing as directed. Secured With: [employer], 4.5x3.1 (in/yd) [facility]) Every Other Day/15 Days Discharge Instructions: Secure with Kerlix as directed. Secured With: 72M Medipore H Soft Cloth Surgical T 4 x 2 (in/yd) (Home Health) Every Other Day/15 Days ape Discharge Instructions: Secure dressing with tape as directed. Wound #7 - Malleolus Wound Laterality: Left, Lateral Cleanser: Wound Cleanser (Home Health) Every Other Day/15 Days Discharge Instructions: Cleanse the wound with wound cleanser prior to applying a clean dressing using gauze sponges, not tissue or cotton balls. Peri-Wound Care: Zinc Oxide Ointment 30g tube [facility]) Every Other Day/15 Days Discharge Instructions: Apply Zinc Oxide to periwound with each dressing change Prim Dressing: KerraCel Ag Gelling Fiber Dressing, 4x5 in (silver alginate) [facility]) Every Other Day/15  Days ary Discharge Instructions: Apply silver alginate to wound bed as instructed Secondary Dressing: Woven  Gauze Sponge, Non-Sterile 4x4 in [facility]) Every Other Day/15 Days Discharge Instructions: Apply over primary dressing as directed. Secondary Dressing: ABD Pad, 5x9 [facility]) Every Other Day/15 Days Discharge Instructions: Apply over primary dressing as directed. Secured With: [employer], 4.5x3.1 (in/yd) [facility]) Every Other Day/15 Days Discharge Instructions: Secure with Kerlix as directed. Secured With: 44M Medipore H Soft Cloth Surgical T 4 x 2 (in/yd) (Home Health) Every Other Day/15 Days ape Discharge Instructions: Secure dressing with tape as directed. Wound #8 - T Second oe Wound Laterality: Left Cleanser: Wound Cleanser (Home Health) Every Other Day/15 Days Discharge Instructions: Cleanse the wound with wound cleanser prior to applying a clean dressing using gauze sponges, not tissue or cotton balls. Peri-Wound Care: Zinc Oxide Ointment 30g tube [facility]) Every Other Day/15 Days Discharge Instructions: Apply Zinc Oxide to periwound with each dressing change Prim Dressing: KerraCel Ag Gelling Fiber Dressing, 4x5 in (silver alginate) (Home Health) Every Other Day/15 Days ary Discharge Instructions: Apply silver alginate to wound bed as instructed Secondary Dressing: Woven Gauze Sponge, Non-Sterile 4x4 in [facility]) Every Other Day/15 Days Discharge Instructions: Apply over primary dressing as directed. Secondary Dressing: ABD Pad, 5x9 [facility]) Every Other Day/15 Days Discharge Instructions: Apply over primary dressing as directed. Secured With: [employer], 4.5x3.1 (in/yd) [facility]) Every Other Day/15 Days Discharge Instructions: Secure with Kerlix as directed. Secured With: 44M Medipore H Soft Cloth Surgical T 4 x 2 (in/yd) (Home Health) Every Other Day/15 Days ape Discharge Instructions: Secure dressing with tape as directed. Wound #9 - T Great oe Wound Laterality: Right, Lateral Cleanser: Wound Cleanser (Home Health) Every Other Day/15  Days Discharge Instructions: Cleanse the wound with wound cleanser prior to applying a clean dressing using gauze sponges, not tissue or cotton balls. Peri-Wound Care: Zinc Oxide Ointment 30g tube [facility]) Every Other Day/15 Days Discharge Instructions: Apply Zinc Oxide to periwound with each dressing change Prim Dressing: KerraCel Ag Gelling Fiber Dressing, 4x5 in (silver alginate) (Home Health) Every Other Day/15 Days ary Discharge Instructions: Apply silver alginate to wound bed as instructed Secondary Dressing: Woven Gauze Sponge, Non-Sterile 4x4 in [facility] [facility]) Every Other Day/15 Days Discharge Instructions: Apply over primary dressing as directed. Secondary Dressing: ABD Pad, 5x9 [facility]) Every Other Day/15 Days Discharge Instructions: Apply over primary dressing as directed. Secured With: [employer], 4.5x3.1 (in/yd) [facility] Every Other Day/15 Days Discharge Instructions: Secure with Kerlix as directed. Secured With: 44M Medipore H Soft Cloth Surgical T 4 x 2 (in/yd) (Home Health) Every Other Day/15 Days ape Discharge Instructions: Secure dressing with tape as directed. Patient Medications llergies: No Known Drug Allergies A Notifications Medication Indication Start End wound infection 11/05/2020 Augmentin DOSE oral 250 mg/5 mL-62.5 mg/5 mL suspension for reconstitution - suspension for reconstitution oral 109ml(=500mg ) tid for an additional 10 days continuing rx Electronic Signature(s) Signed: 11/05/2020 3:46:04 PM By: Linton Ham MD Entered By: Linton Ham on 11/05/2020 15:46:02 -------------------------------------------------------------------------------- Problem List Details Patient Name: Date of Service: Tanya Jensen. 11/05/2020 1:45 PM Medical Record Number: [id number] Patient Account Number: [id number] Date of Birth/Sex: Treating RN: Jul 07, 1945 (75 y.o. Sue Lush Primary Care Provider: Shirline Frees Other Clinician: Referring  Provider: Treating Provider/Extender: Ponciano Ort in Treatment: 1 Active Problems ICD-10 Encounter Code Description Active Date MDM Diagnosis G31.83 Dementia with Lewy bodies 10/29/2020 No Yes L89.224 Pressure  ulcer of left hip, stage 4 10/29/2020 No Yes L89.150 Pressure ulcer of sacral region, unstageable 10/29/2020 No Yes L89.510 Pressure ulcer of right ankle, unstageable 10/29/2020 No Yes L89.614 Pressure ulcer of right heel, stage 4 10/29/2020 No Yes L97.528 Non-pressure chronic ulcer of other part of left foot with other specified 10/29/2020 No Yes severity L89.520 Pressure ulcer of left ankle, unstageable 10/29/2020 No Yes L97.518 Non-pressure chronic ulcer of other part of right foot with other specified 10/29/2020 No Yes severity L97.528 Non-pressure chronic ulcer of other part of left foot with other specified 10/29/2020 No Yes severity Inactive Problems Resolved Problems Electronic Signature(s) Signed: 11/06/2020 1:36:13 PM By: Linton Ham MD Entered By: Linton Ham on 11/05/2020 15:46:45 -------------------------------------------------------------------------------- Progress Note Details Patient Name: Date of Service: Tanya Jensen, Tanya Hay. 11/05/2020 1:45 PM Medical Record Number: [id number] Patient Account Number: [id number] Date of Birth/Sex: Treating RN: [birth year] (75 y.o. Sue Lush Primary Care Provider: Shirline Frees Other Clinician: Referring Provider: Treating Provider/Extender: Ponciano Ort in Treatment: 1 Subjective History of Present Illness (HPI) ADMISSION 10/29/2020 This is a 75 year old woman who is very disabled secondary to advanced Lewy body dementia. Essentially bed and chair bound. She has 100% dependent on her husband for care and he accompanied her today. She also has advanced home care. She was she is here with multiple pressure areas some of them really serious; oo Major area on the  left buttock and sacrum completely covered in necrotic surface oo Deep probing area over the left greater trochanter/hip. This goes well into the muscle layer although I did not feel bone. oo Right heel 2 small areas in close juxtaposition both of them necrotic although they do not connect oo Left heel blister/boggy oo Left medial foot blackened eschar over the fifth metatarsal base oo Left lateral malleolus also necrotic surface. oo Right first toe 2 small areas in the left second toe also an open area. According to her husband they are using Medihoney although I am not exactly sure which product. They have ordered some form of pressure relief surface for her bed although I do not think they have it yet. They have also ordered a cushion for her wheelchair. She apparently eats well per the husband and there has not been any recent weight loss although she did have some weight loss he thinks it stabilized. She is on Ensure supplements. Past medical history includes Lewy body dementia, open angle glaucoma, lumbar spondylosis 11/05/2020; patient admitted to the clinic last week. She has multitude of chronic pressure ulcers in the setting of advanced dementia. Culture of the left hip that I did last time showed Streptococcus Constellatus and E. coli. Both of which should have been sensitive to the Augmentin. X-ray did not show any evidence of osteomyelitis but evaluation was limited by a large rectal stool impaction. We have been using silver alginate to all wounds. There is far too much dimension to her surface area to consider alternative dressings at this point Objective Constitutional Sitting or standing Blood Pressure is within target range for patient.. Pulse regular and within target range for patient.Marland Kitchen Respirations regular, non-labored and within target range.. Temperature is normal and within the target range for the patient.Marland Kitchen Appears in no distress. Vitals Time Taken: 2:08 PM, Height: 67  in, Temperature: 98.2 F, Pulse: 69 bpm, Respiratory Rate: 18 breaths/min, Blood Pressure: 120/85 mmHg. General Notes: Wound exam; oo Major areas on the left buttock in close proximity to the sacrum. Completely necrotic surface on  this I remove necrotic tissue with pickups and scissors this undoubtedly will go to bone oo The deep tunneling area over the left greater trochanter still has purulent looking material here. There is no way to debride this the orifice is simply too small. What I can visualize here does not look healthy. oo Also debridement of the left lateral ankle l and both areas on the right medial and right lateral heel. Using #5 curette's oo Left medial foot also extensively debrided. oo There is areas on the tips of her first and second toes on the left Integumentary (Hair, Skin) Wound #1 status is Open. Original cause of wound was Pressure Injury. The date acquired was: 07/29/2020. The wound has been in treatment 1 weeks. The wound is located on the Sacrum. The wound measures 8.5cm length x 8cm width x 3.5cm depth; 53.407cm^2 area and 186.925cm^3 volume. There is muscle and Fat Layer (Subcutaneous Tissue) exposed. There is a large amount of serosanguineous drainage noted. Foul odor after cleansing was noted. The wound margin is flat and intact. There is small (1-33%) red granulation within the wound bed. There is a medium (34-66%) amount of necrotic tissue within the wound bed including Eschar, Adherent Slough and Necrosis of Muscle. Wound #10 status is Open. Original cause of wound was Pressure Injury. The date acquired was: 07/29/2020. The wound has been in treatment 1 weeks. The wound is located on the Right,Distal T [place]. The wound measures 1.5cm length x 1.3cm width x 0.1cm depth; 1.532cm^2 area and 0.153cm^3 volume. There oe is Fat Layer (Subcutaneous Tissue) exposed. There is no tunneling or undermining noted. There is a medium amount of serosanguineous drainage noted.  The wound margin is indistinct and nonvisible. There is no granulation within the wound bed. There is a large (67-100%) amount of necrotic tissue within the wound bed including Eschar. Wound #2 status is Open. Original cause of wound was Pressure Injury. The date acquired was: 07/29/2020. The wound has been in treatment 1 weeks. The wound is located on the Left Trochanter. The wound measures 1.9cm length x 1.1cm width x 2cm depth; 1.641cm^2 area and 3.283cm^3 volume. There is muscle and Fat Layer (Subcutaneous Tissue) exposed. There is no tunneling or undermining noted. There is a large amount of purulent drainage noted. Foul odor after cleansing was noted. The wound margin is thickened. There is small (1-33%) red granulation within the wound bed. There is a large (67-100%) amount of necrotic tissue within the wound bed including Necrosis of Muscle. Wound #3 status is Open. Original cause of wound was Pressure Injury. The date acquired was: 07/29/2020. The wound has been in treatment 1 weeks. The wound is located on the Right,Lateral Malleolus. The wound measures 1.5cm length x 1.3cm width x 0.1cm depth; 1.532cm^2 area and 0.153cm^3 volume. There is Fat Layer (Subcutaneous Tissue) exposed. There is no tunneling or undermining noted. There is a medium amount of serosanguineous drainage noted. The wound margin is distinct with the outline attached to the wound base. There is no granulation within the wound bed. There is a large (67-100%) amount of necrotic tissue within the wound bed including Eschar and Adherent Slough. Wound #4 status is Open. Original cause of wound was Pressure Injury. The date acquired was: 08/29/2020. The wound has been in treatment 1 weeks. The wound is located on the Right Calcaneus. The wound measures 4cm length x 1.5cm width x 1cm depth; 4.712cm^2 area and 4.712cm^3 volume. Wound #5 status is Open. Original cause of wound was Pressure  Injury. The date acquired was: 07/29/2020. The  wound has been in treatment 1 weeks. The wound is located on the Left Calcaneus. The wound measures 3cm length x 4.1cm width x 0.1cm depth; 9.66cm^2 area and 0.966cm^3 volume. There is no tunneling or undermining noted. There is a medium amount of serosanguineous drainage noted. The wound margin is indistinct and nonvisible. There is no granulation within the wound bed. There is no necrotic tissue within the wound bed. General Notes: intact blister remains closed Wound #6 status is Open. Original cause of wound was Pressure Injury. The date acquired was: 07/29/2020. The wound has been in treatment 1 weeks. The wound is located on the Left,Medial Foot. The wound measures 0.9cm length x 0.9cm width x 0.1cm depth; 0.636cm^2 area and 0.064cm^3 volume. There is Fat Layer (Subcutaneous Tissue) exposed. There is no tunneling or undermining noted. There is a medium amount of serosanguineous drainage noted. The wound margin is flat and intact. There is small (1-33%) granulation within the wound bed. There is a large (67-100%) amount of necrotic tissue within the wound bed including Eschar. Wound #7 status is Open. Original cause of wound was Pressure Injury. The date acquired was: 07/29/2020. The wound has been in treatment 1 weeks. The wound is located on the Left,Lateral Malleolus. The wound measures 8.8cm length x 2.2cm width x 0.3cm depth; 15.205cm^2 area and 4.562cm^3 volume. There is Fat Layer (Subcutaneous Tissue) exposed. There is no tunneling or undermining noted. There is a medium amount of serosanguineous drainage noted. The wound margin is flat and intact. There is small (1-33%) red granulation within the wound bed. There is a large (67-100%) amount of necrotic tissue within the wound bed including Eschar. Wound #8 status is Open. Original cause of wound was Pressure Injury. The date acquired was: 07/29/2020. The wound has been in treatment 1 weeks. The wound is located on the Left T Second. The  wound measures 0.3cm length x 0.4cm width x 0.1cm depth; 0.094cm^2 area and 0.009cm^3 volume. There is oe Fat Layer (Subcutaneous Tissue) exposed. There is no tunneling or undermining noted. There is a medium amount of serosanguineous drainage noted. The wound margin is flat and intact. There is no granulation within the wound bed. There is a large (67-100%) amount of necrotic tissue within the wound bed including Adherent Slough. Wound #9 status is Open. Original cause of wound was Pressure Injury. The date acquired was: 07/29/2020. The wound has been in treatment 1 weeks. The wound is located on the Right,Lateral T Great. The wound measures 0.6cm length x 1cm width x 0.1cm depth; 0.471cm^2 area and 0.047cm^3 volume. There oe is Fat Layer (Subcutaneous Tissue) exposed. There is no tunneling or undermining noted. There is a medium amount of serosanguineous drainage noted. The wound margin is flat and intact. There is no granulation within the wound bed. There is a large (67-100%) amount of necrotic tissue within the wound bed. Assessment Active Problems ICD-10 Dementia with Lewy bodies Pressure ulcer of left hip, stage 4 Pressure ulcer of sacral region, unstageable Pressure ulcer of right ankle, unstageable Pressure ulcer of right heel, stage 4 Non-pressure chronic ulcer of other part of left foot with other specified severity Pressure ulcer of left ankle, unstageable Non-pressure chronic ulcer of other part of right foot with other specified severity Non-pressure chronic ulcer of other part of left foot with other specified severity Procedures Wound #1 Pre-procedure diagnosis of Wound #1 is a Pressure Ulcer located on the Sacrum . There was a Excisional  Skin/Subcutaneous Tissue Debridement with a total area of 20 sq cm performed by Ricard Dillon., MD. With the following instrument(s): Forceps, and Scissors to remove Non-Viable tissue/material. Material removed includes Subcutaneous  Tissue after achieving pain control using Other (Benzocaine). No specimens were taken. A time out was conducted at 15:24, prior to the start of the procedure. A Minimum amount of bleeding was controlled with Pressure. The procedure was tolerated well. Post Debridement Measurements: 8.5cm length x 8cm width x 3.5cm depth; 186.925cm^3 volume. Post debridement Stage noted as Category/Stage IV. Character of Wound/Ulcer Post Debridement is stable. Post procedure Diagnosis Wound #1: Same as Pre-Procedure Wound #4 Pre-procedure diagnosis of Wound #4 is a Pressure Ulcer located on the Right Calcaneus . There was a Excisional Skin/Subcutaneous Tissue Debridement with a total area of 6 sq cm performed by Ricard Dillon., MD. With the following instrument(s): Curette to remove Non-Viable tissue/material. Material removed includes Subcutaneous Tissue after achieving pain control using Other (Benzocaine). No specimens were taken. A time out was conducted at 15:24, prior to the start of the procedure. A Minimum amount of bleeding was controlled with Pressure. The procedure was tolerated well. Post Debridement Measurements: 4cm length x 1.5cm width x 1cm depth; 4.712cm^3 volume. Post debridement Stage noted as Unstageable/Unclassified. Character of Wound/Ulcer Post Debridement is stable. Post procedure Diagnosis Wound #4: Same as Pre-Procedure Wound #6 Pre-procedure diagnosis of Wound #6 is a Pressure Ulcer located on the Left,Medial Foot . There was a Excisional Skin/Subcutaneous Tissue Debridement with a total area of 0.81 sq cm performed by Ricard Dillon., MD. With the following instrument(s): Curette to remove Non-Viable tissue/material. Material removed includes Subcutaneous Tissue after achieving pain control using Other (Benzocaine). No specimens were taken. A time out was conducted at 15:24, prior to the start of the procedure. A Minimum amount of bleeding was controlled with Pressure. The procedure  was tolerated well. Post Debridement Measurements: 0.9cm length x 0.9cm width x 0.1cm depth; 0.064cm^3 volume. Post debridement Stage noted as Unstageable/Unclassified. Character of Wound/Ulcer Post Debridement is stable. Post procedure Diagnosis Wound #6: Same as Pre-Procedure Wound #7 Pre-procedure diagnosis of Wound #7 is a Pressure Ulcer located on the Left,Lateral Malleolus . There was a Excisional Skin/Subcutaneous Tissue Debridement with a total area of 10 sq cm performed by Ricard Dillon., MD. With the following instrument(s): Curette to remove Non-Viable tissue/material. Material removed includes Subcutaneous Tissue after achieving pain control using Other (Benzocaine). No specimens were taken. A time out was conducted at 15:24, prior to the start of the procedure. A Minimum amount of bleeding was controlled with Pressure. The procedure was tolerated well. Post Debridement Measurements: 8.8cm length x 2.2cm width x 0.3cm depth; 4.562cm^3 volume. Post debridement Stage noted as Unstageable/Unclassified. Character of Wound/Ulcer Post Debridement is stable. Post procedure Diagnosis Wound #7: Same as Pre-Procedure Plan Follow-up Appointments: Return Appointment in 1 week. - Dr. Dellia Nims ****EXTRA TIME FOR HIGH ACUITY-10 WOUNDS**** Other: - 11/05/20:Refill of antibiotics will be sent to pharmacy Bathing/ Shower/ Hygiene: May shower with protection but do not get wound dressing(s) wet. Off-Loading: Turn and reposition every 2 hours Other: - Float heels Additional Orders / Instructions: Follow Nutritious Diet Home Health: No change in wound care orders this week; continue Home Health for wound care. May utilize formulary equivalent dressing for wound treatment orders unless otherwise specified. - Advanced to change Monday's, Wednesday's, Friday's The following medication(s) was prescribed: Augmentin oral 250 mg/5 mL-62.5 mg/5 mL suspension for reconstitution suspension for reconstitution  oral 74ml(=500mg ) tid  for an additional 10 days continuing rx for wound infection starting 11/05/2020 WOUND #1: - Sacrum Wound Laterality: Cleanser: Wound Cleanser (Home Health) Every Other Day/15 Days Discharge Instructions: Cleanse the wound with wound cleanser prior to applying a clean dressing using gauze sponges, not tissue or cotton balls. Peri-Wound Care: Zinc Oxide Ointment 30g tube [facility]) Every Other Day/15 Days Discharge Instructions: Apply Zinc Oxide to periwound with each dressing change Prim Dressing: KerraCel Ag Gelling Fiber Dressing, 4x5 in (silver alginate) (Home Health) Every Other Day/15 Days ary Discharge Instructions: Apply silver alginate to wound bed as instructed Secondary Dressing: Woven Gauze Sponge, Non-Sterile 4x4 in [facility]) Every Other Day/15 Days Discharge Instructions: Apply over primary dressing as directed. Secondary Dressing: ABD Pad, 5x9 [facility]) Every Other Day/15 Days Discharge Instructions: Apply over primary dressing as directed. Secured With: 44M Medipore H Soft Cloth Surgical T 4 x 2 (in/yd) (Home Health) Every Other Day/15 Days ape Discharge Instructions: Secure dressing with tape as directed. WOUND #10: - T Great Wound Laterality: Right, Distal oe Cleanser: Wound Cleanser (Home Health) Every Other Day/15 Days Discharge Instructions: Cleanse the wound with wound cleanser prior to applying a clean dressing using gauze sponges, not tissue or cotton balls. Peri-Wound Care: Zinc Oxide Ointment 30g tube [facility]) Every Other Day/15 Days Discharge Instructions: Apply Zinc Oxide to periwound with each dressing change Prim Dressing: KerraCel Ag Gelling Fiber Dressing, 4x5 in (silver alginate) (Home Health) Every Other Day/15 Days ary Discharge Instructions: Apply silver alginate to wound bed as instructed Secondary Dressing: Woven Gauze Sponge, Non-Sterile 4x4 in [facility]) Every Other Day/15 Days Discharge Instructions: Apply over  primary dressing as directed. Secondary Dressing: ABD Pad, 5x9 [facility]) Every Other Day/15 Days Discharge Instructions: Apply over primary dressing as directed. Secured With: [employer], 4.5x3.1 (in/yd) [facility]) Every Other Day/15 Days Discharge Instructions: Secure with Kerlix as directed. Secured With: 44M Medipore H Soft Cloth Surgical T 4 x 2 (in/yd) (Home Health) Every Other Day/15 Days ape Discharge Instructions: Secure dressing with tape as directed. WOUND #2: - Trochanter Wound Laterality: Left Cleanser: Wound Cleanser (Home Health) Every Other Day/15 Days Discharge Instructions: Cleanse the wound with wound cleanser prior to applying a clean dressing using gauze sponges, not tissue or cotton balls. Peri-Wound Care: Zinc Oxide Ointment 30g tube [facility]) Every Other Day/15 Days Discharge Instructions: Apply Zinc Oxide to periwound with each dressing change Prim Dressing: KerraCel Ag Gelling Fiber Dressing, 4x5 in (silver alginate) (Home Health) Every Other Day/15 Days ary Discharge Instructions: Apply silver alginate to wound bed as instructed Secondary Dressing: Woven Gauze Sponge, Non-Sterile 4x4 in [facility]) Every Other Day/15 Days Discharge Instructions: Apply over primary dressing as directed. Secondary Dressing: ABD Pad, 5x9 [facility]) Every Other Day/15 Days Discharge Instructions: Apply over primary dressing as directed. Secured With: 44M Medipore H Soft Cloth Surgical T 4 x 2 (in/yd) (Home Health) Every Other Day/15 Days ape Discharge Instructions: Secure dressing with tape as directed. WOUND #3: - Malleolus Wound Laterality: Right, Lateral Cleanser: Wound Cleanser (Home Health) Every Other Day/15 Days Discharge Instructions: Cleanse the wound with wound cleanser prior to applying a clean dressing using gauze sponges, not tissue or cotton balls. Peri-Wound Care: Zinc Oxide Ointment 30g tube [facility]) Every Other Day/15 Days Discharge  Instructions: Apply Zinc Oxide to periwound with each dressing change Prim Dressing: KerraCel Ag Gelling Fiber Dressing, 4x5 in (silver alginate) (Home Health) Every Other Day/15 Days ary Discharge Instructions: Apply silver alginate to  wound bed as instructed Secondary Dressing: Woven Gauze Sponge, Non-Sterile 4x4 in [facility]) Every Other Day/15 Days Discharge Instructions: Apply over primary dressing as directed. Secondary Dressing: ABD Pad, 5x9 [facility]) Every Other Day/15 Days Discharge Instructions: Apply over primary dressing as directed. Secured With: [employer], 4.5x3.1 (in/yd) [facility]) Every Other Day/15 Days Discharge Instructions: Secure with Kerlix as directed. Secured With: 47M Medipore H Soft Cloth Surgical T 4 x 2 (in/yd) (Home Health) Every Other Day/15 Days ape Discharge Instructions: Secure dressing with tape as directed. WOUND #4: - Calcaneus Wound Laterality: Right Cleanser: Wound Cleanser (Home Health) Every Other Day/15 Days Discharge Instructions: Cleanse the wound with wound cleanser prior to applying a clean dressing using gauze sponges, not tissue or cotton balls. Peri-Wound Care: Zinc Oxide Ointment 30g tube [facility]) Every Other Day/15 Days Discharge Instructions: Apply Zinc Oxide to periwound with each dressing change Prim Dressing: KerraCel Ag Gelling Fiber Dressing, 4x5 in (silver alginate) (Home Health) Every Other Day/15 Days ary Discharge Instructions: Apply silver alginate to wound bed as instructed Secondary Dressing: Woven Gauze Sponge, Non-Sterile 4x4 in [facility]) Every Other Day/15 Days Discharge Instructions: Apply over primary dressing as directed. Secondary Dressing: ABD Pad, 5x9 [facility]) Every Other Day/15 Days Discharge Instructions: Apply over primary dressing as directed. Secured With: [employer], 4.5x3.1 (in/yd) [facility]) Every Other Day/15 Days Discharge Instructions: Secure with Kerlix as  directed. Secured With: 47M Medipore H Soft Cloth Surgical T 4 x 2 (in/yd) (Home Health) Every Other Day/15 Days ape Discharge Instructions: Secure dressing with tape as directed. WOUND #5: - Calcaneus Wound Laterality: Left Cleanser: Wound Cleanser (Home Health) Every Other Day/15 Days Discharge Instructions: Cleanse the wound with wound cleanser prior to applying a clean dressing using gauze sponges, not tissue or cotton balls. Peri-Wound Care: Zinc Oxide Ointment 30g tube [facility]) Every Other Day/15 Days Discharge Instructions: Apply Zinc Oxide to periwound with each dressing change Prim Dressing: KerraCel Ag Gelling Fiber Dressing, 4x5 in (silver alginate) (Home Health) Every Other Day/15 Days ary Discharge Instructions: Apply silver alginate to wound bed as instructed Secondary Dressing: Woven Gauze Sponge, Non-Sterile 4x4 in [facility]) Every Other Day/15 Days Discharge Instructions: Apply over primary dressing as directed. Secondary Dressing: ABD Pad, 5x9 [facility]) Every Other Day/15 Days Discharge Instructions: Apply over primary dressing as directed. Secured With: [employer], 4.5x3.1 (in/yd) [facility]) Every Other Day/15 Days Discharge Instructions: Secure with Kerlix as directed. Secured With: 47M Medipore H Soft Cloth Surgical T 4 x 2 (in/yd) (Home Health) Every Other Day/15 Days ape Discharge Instructions: Secure dressing with tape as directed. WOUND #6: - Foot Wound Laterality: Left, Medial Cleanser: Wound Cleanser (Home Health) Every Other Day/15 Days Discharge Instructions: Cleanse the wound with wound cleanser prior to applying a clean dressing using gauze sponges, not tissue or cotton balls. Peri-Wound Care: Zinc Oxide Ointment 30g tube [facility]) Every Other Day/15 Days Discharge Instructions: Apply Zinc Oxide to periwound with each dressing change Prim Dressing: KerraCel Ag Gelling Fiber Dressing, 4x5 in (silver alginate) (Home Health) Every Other  Day/15 Days ary Discharge Instructions: Apply silver alginate to wound bed as instructed Secondary Dressing: Woven Gauze Sponge, Non-Sterile 4x4 in [facility]) Every Other Day/15 Days Discharge Instructions: Apply over primary dressing as directed. Secondary Dressing: ABD Pad, 5x9 [facility]) Every Other Day/15 Days Discharge Instructions: Apply over primary dressing as directed. Secured With: [employer], 4.5x3.1 (in/yd) [facility]) Every Other Day/15 Days Discharge Instructions:  Secure with Kerlix as directed. Secured With: 37M Med[employer] H Soft Cloth Surgical T 4 x 2 (in/yd) (Home Health) Every Other Day/15 Days ape Discharge Instructions: Secure dressing with tape as directed. WOUND #7: - Malleolus Wound Laterality: Left, Lateral Cleanser: Wound Cleanser (Home Health) Every Other Day/15 Days Discharge Instructions: Cleanse the wound with wound cleanser prior to applying a clean dressing using gauze sponges, not tissue or cotton balls. Peri-Wound Care: Zinc Oxide Ointment 30g tube [facility]) Every Other Day/15 Days Discharge Instructions: Apply Zinc Oxide to periwound with each dressing change Prim Dressing: KerraCel Ag Gelling Fiber Dressing, 4x5 in (silver alginate) (Home Health) Every Other Day/15 Days ary Discharge Instructions: Apply silver alginate to wound bed as instructed Secondary Dressing: Woven Gauze Sponge, Non-Sterile 4x4 in [facility]) Every Other Day/15 Days Discharge Instructions: Apply over primary dressing as directed. Secondary Dressing: ABD Pad, 5x9 [facility]) Every Other Day/15 Days Discharge Instructions: Apply over primary dressing as directed. Secured With: [employer], 4.5x3.1 (in/yd) [facility]) Every Other Day/15 Days Discharge Instructions: Secure with Kerlix as directed. Secured With: 37M Medipore H Soft Cloth Surgical T 4 x 2 (in/yd) (Home Health) Every Other Day/15 Days ape Discharge Instructions: Secure dressing with tape as  directed. WOUND #8: - T Second Wound Laterality: Left oe Cleanser: Wound Cleanser (Home Health) Every Other Day/15 Days Discharge Instructions: Cleanse the wound with wound cleanser prior to applying a clean dressing using gauze sponges, not tissue or cotton balls. Peri-Wound Care: Zinc Oxide Ointment 30g tube [facility]) Every Other Day/15 Days Discharge Instructions: Apply Zinc Oxide to periwound with each dressing change Prim Dressing: KerraCel Ag Gelling Fiber Dressing, 4x5 in (silver alginate) (Home Health) Every Other Day/15 Days ary Discharge Instructions: Apply silver alginate to wound bed as instructed Secondary Dressing: Woven Gauze Sponge, Non-Sterile 4x4 in [facility]) Every Other Day/15 Days Discharge Instructions: Apply over primary dressing as directed. Secondary Dressing: ABD Pad, 5x9 [facility]) Every Other Day/15 Days Discharge Instructions: Apply over primary dressing as directed. Secured With: [employer], 4.5x3.1 (in/yd) [facility]) Every Other Day/15 Days Discharge Instructions: Secure with Kerlix as directed. Secured With: 37M Medipore H Soft Cloth Surgical T 4 x 2 (in/yd) (Home Health) Every Other Day/15 Days ape Discharge Instructions: Secure dressing with tape as directed. WOUND #9: - T Great Wound Laterality: Right, Lateral oe Cleanser: Wound Cleanser (Home Health) Every Other Day/15 Days Discharge Instructions: Cleanse the wound with wound cleanser prior to applying a clean dressing using gauze sponges, not tissue or cotton balls. Peri-Wound Care: Zinc Oxide Ointment 30g tube [facility]) Every Other Day/15 Days Discharge Instructions: Apply Zinc Oxide to periwound with each dressing change Prim Dressing: KerraCel Ag Gelling Fiber Dressing, 4x5 in (silver alginate) (Home Health) Every Other Day/15 Days ary Discharge Instructions: Apply silver alginate to wound bed as instructed Secondary Dressing: Woven Gauze Sponge, Non-Sterile 4x4 in [facility]) Every Other Day/15 Days Discharge Instructions: Apply over primary dressing as directed. Secondary Dressing: ABD Pad, 5x9 [facility]) Every Other Day/15 Days Discharge Instructions: Apply over primary dressing as directed. Secured With: [employer], 4.5x3.1 (in/yd) [facility]) Every Other Day/15 Days Discharge Instructions: Secure with Kerlix as directed. Secured With: 37M Medipore H Soft Cloth Surgical T 4 x 2 (in/yd) (Home Health) Every Other Day/15 Days ape Discharge Instructions: Secure dressing with tape as directed. 1. Still using silver alginate to all wounds 2. I reordered the Augmentin 500 mg 3 times a day to continue for  another 10 days after the first 10 days finishes 3. I do not know that we could image this patient's in terms of her coccyx and hip. She is much too active 4. She apparently is eating well. He has a I think a level 2 surface on the mattress. Her main caregiver is her husband Engineer, maintenance) Signed: 11/06/2020 1:36:13 PM By: Linton Ham MD Entered By: Linton Ham on 11/05/2020 15:58:54 -------------------------------------------------------------------------------- SuperBill Details Patient Name: Date of Service: Tanya Jensen, Tanya Hay 11/05/2020 Medical Record Number: [id number] Patient Account Number: [id number] Date of Birth/Sex: Treating RN: [birth year] (75 y.o. Sue Lush Primary Care Provider: Shirline Frees Other Clinician: Referring Provider: Treating Provider/Extender: Ponciano Ort in Treatment: 1 Diagnosis Coding ICD-10 Codes Code Description G31.83 Dementia with Lewy bodies [phone] Pressure ulcer of left hip, stage 4 L89.150 Pressure ulcer of sacral region, unstageable L89.510 Pressure ulcer of right ankle, unstageable L89.614 Pressure ulcer of right heel, stage 4 L97.528 Non-pressure chronic ulcer of other part of left foot with other specified severity L89.520 Pressure ulcer of left  ankle, unstageable L97.518 Non-pressure chronic ulcer of other part of right foot with other specified severity L97.528 Non-pressure chronic ulcer of other part of left foot with other specified severity Facility Procedures CPT4 Code: 62836629 Description: 11042 - DEB SUBQ TISSUE 20 SQ CM/< ICD-10 Diagnosis Description L89.150 Pressure ulcer of sacral region, unstageable L97.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 Non-pressure chronic ulcer of other part of left foot with other specified seve L97.518 Non-pressure chronic ulcer  of other part of right foot with other specified sev Modifier: rity erity Quantity: 1 CPT4 Code: [id number] Description: 11045 - DEB SUBQ TISS EA ADDL 20CM ICD-10 Diagnosis Description L89.150 Pressure ulcer of sacral region, unstageable L97.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 Non-pressure chronic ulcer of other part of left foot with other specified seve L97.518 Non-pressure chronic ulcer  of other part of right foot with other specified sev Modifier: rity erity Quantity: 1 Physician Procedures : CPT4 Code Description Modifier [id number] 11042 - WC PHYS SUBQ TISS 20 SQ CM ICD-10 Diagnosis Description L89.150 Pressure ulcer of sacral region, unstageable L97.528 Non-pressure chronic ulcer of other part of left foot with other specified severity  L97.518 Non-pressure chronic ulcer of other part of right foot with other specified severity Quantity: 1 : [id number] 11045 - WC PHYS SUBQ TISS EA ADDL 20 CM ICD-10 Diagnosis Description L89.150 Pressure ulcer of sacral region, unstageable L97.528 Non-pressure chronic ulcer of other part of left foot with other specified severity L97.518 Non-pressure chronic  ulcer of other part of right foot with other specified severity Quantity: 1 Electronic Signature(s) Signed: 11/06/2020 1:36:13 PM By: Linton Ham MD Entered By: Linton Ham on 11/05/2020 15:59:10

## 2020-11-08 DIAGNOSIS — I10 Essential (primary) hypertension: Secondary | ICD-10-CM | POA: Diagnosis not present

## 2020-11-08 DIAGNOSIS — L89510 Pressure ulcer of right ankle, unstageable: Secondary | ICD-10-CM | POA: Diagnosis not present

## 2020-11-08 DIAGNOSIS — L89610 Pressure ulcer of right heel, unstageable: Secondary | ICD-10-CM | POA: Diagnosis not present

## 2020-11-08 DIAGNOSIS — L89890 Pressure ulcer of other site, unstageable: Secondary | ICD-10-CM | POA: Diagnosis not present

## 2020-11-08 DIAGNOSIS — L89320 Pressure ulcer of left buttock, unstageable: Secondary | ICD-10-CM | POA: Diagnosis not present

## 2020-11-08 DIAGNOSIS — G3183 Dementia with Lewy bodies: Secondary | ICD-10-CM | POA: Diagnosis not present

## 2020-11-08 DIAGNOSIS — F0281 Dementia in other diseases classified elsewhere with behavioral disturbance: Secondary | ICD-10-CM | POA: Diagnosis not present

## 2020-11-08 DIAGNOSIS — M1990 Unspecified osteoarthritis, unspecified site: Secondary | ICD-10-CM | POA: Diagnosis not present

## 2020-11-08 DIAGNOSIS — L89520 Pressure ulcer of left ankle, unstageable: Secondary | ICD-10-CM | POA: Diagnosis not present

## 2020-11-11 DIAGNOSIS — L89520 Pressure ulcer of left ankle, unstageable: Secondary | ICD-10-CM | POA: Diagnosis not present

## 2020-11-11 DIAGNOSIS — G3183 Dementia with Lewy bodies: Secondary | ICD-10-CM | POA: Diagnosis not present

## 2020-11-11 DIAGNOSIS — M1990 Unspecified osteoarthritis, unspecified site: Secondary | ICD-10-CM | POA: Diagnosis not present

## 2020-11-11 DIAGNOSIS — L89610 Pressure ulcer of right heel, unstageable: Secondary | ICD-10-CM | POA: Diagnosis not present

## 2020-11-11 DIAGNOSIS — L89320 Pressure ulcer of left buttock, unstageable: Secondary | ICD-10-CM | POA: Diagnosis not present

## 2020-11-11 DIAGNOSIS — I10 Essential (primary) hypertension: Secondary | ICD-10-CM | POA: Diagnosis not present

## 2020-11-11 DIAGNOSIS — L89510 Pressure ulcer of right ankle, unstageable: Secondary | ICD-10-CM | POA: Diagnosis not present

## 2020-11-11 DIAGNOSIS — F0281 Dementia in other diseases classified elsewhere with behavioral disturbance: Secondary | ICD-10-CM | POA: Diagnosis not present

## 2020-11-11 DIAGNOSIS — L89890 Pressure ulcer of other site, unstageable: Secondary | ICD-10-CM | POA: Diagnosis not present

## 2020-11-12 ENCOUNTER — Encounter (HOSPITAL_BASED_OUTPATIENT_CLINIC_OR_DEPARTMENT_OTHER): Payer: Medicare HMO | Admitting: Internal Medicine

## 2020-11-13 DIAGNOSIS — L89510 Pressure ulcer of right ankle, unstageable: Secondary | ICD-10-CM | POA: Diagnosis not present

## 2020-11-13 DIAGNOSIS — I10 Essential (primary) hypertension: Secondary | ICD-10-CM | POA: Diagnosis not present

## 2020-11-13 DIAGNOSIS — L89890 Pressure ulcer of other site, unstageable: Secondary | ICD-10-CM | POA: Diagnosis not present

## 2020-11-13 DIAGNOSIS — M1990 Unspecified osteoarthritis, unspecified site: Secondary | ICD-10-CM | POA: Diagnosis not present

## 2020-11-13 DIAGNOSIS — L89520 Pressure ulcer of left ankle, unstageable: Secondary | ICD-10-CM | POA: Diagnosis not present

## 2020-11-13 DIAGNOSIS — L89320 Pressure ulcer of left buttock, unstageable: Secondary | ICD-10-CM | POA: Diagnosis not present

## 2020-11-13 DIAGNOSIS — G3183 Dementia with Lewy bodies: Secondary | ICD-10-CM | POA: Diagnosis not present

## 2020-11-13 DIAGNOSIS — L89610 Pressure ulcer of right heel, unstageable: Secondary | ICD-10-CM | POA: Diagnosis not present

## 2020-11-13 DIAGNOSIS — F0281 Dementia in other diseases classified elsewhere with behavioral disturbance: Secondary | ICD-10-CM | POA: Diagnosis not present

## 2020-11-19 ENCOUNTER — Encounter (HOSPITAL_BASED_OUTPATIENT_CLINIC_OR_DEPARTMENT_OTHER): Payer: Medicare HMO | Admitting: Internal Medicine

## 2020-11-21 NOTE — Progress Notes (Signed)
RUMI, KOLODZIEJ ([id number]) Visit Report for 10/29/2020 Abuse/Suicide Risk Screen Details Patient Name: Date of Service: Tanya Jensen 10/29/2020 10:30 A M Medical Record Number: [id number] Patient Account Number: [id number] Date of Birth/Sex: Treating RN: November 29, 1945 (75 y.o. [name], [name] Primary Care Katrice Goel: Shirline Frees Other Clinician: Referring Dayra Rapley: Treating Bekim Werntz/Extender: Ponciano Ort in Treatment: 0 Abuse/Suicide Risk Screen Items Answer ABUSE RISK SCREEN: Has anyone close to you tried to hurt or harm you recentlyo No Do you feel uncomfortable with anyone in your familyo No Has anyone forced you do things that you didnt want to doo No Electronic Signature(s) Signed: 11/21/2020 9:05:33 PM By: Deon Pilling Previous Signature: 11/18/2020 8:58:48 PM Version By: Deon Pilling Entered By: Deon Pilling on 11/21/2020 21:01:49 -------------------------------------------------------------------------------- Activities of Daily Living Details Patient Name: Date of Service: Tanya Jensen 10/29/2020 10:30 A M Medical Record Number: [id number] Patient Account Number: [id number] Date of Birth/Sex: Treating RN: Apr 24, 1946 (75 y.o. [name], [name] Primary Care Anes Rigel: Shirline Frees Other Clinician: Referring Chayden Garrelts: Treating Laya Letendre/Extender: Ponciano Ort in Treatment: 0 Activities of Daily Living Items Answer Activities of Daily Living (Please select one for each item) Drive Automobile Not Able T Medications ake Not Able Use T elephone Not Able Care for Appearance Not Able Use T oilet Not Able Manus Rudd / Shower Not Able Dress Self Not Able Feed Self Not Able Walk Not Able Get In / Out Bed Not Able Housework Not Able Prepare Meals Not Able Handle Money Not Able Shop for Self Not Able Notes was able to do some things prior to December - was dropped in December and has not been able to do things since  then Electronic Signature(s) Signed: 11/21/2020 9:05:33 PM By: Deon Pilling Previous Signature: 11/18/2020 8:58:48 PM Version By: Deon Pilling Entered By: Deon Pilling on 11/21/2020 21:01:54 -------------------------------------------------------------------------------- Education Screening Details Patient Name: Date of Service: Tanya Jensen, Tanya NCY L. 10/29/2020 10:30 A M Medical Record Number: [id number] Patient Account Number: [id number] Date of Birth/Sex: Treating RN: 11-19-1945 (76 y.o. [name], [name] Primary Care Treg Diemer: Shirline Frees Other Clinician: Referring Danaka Llera: Treating Adriyana Greenbaum/Extender: Ponciano Ort in Treatment: 0 Primary Learner Assessed: Caregiver spouse Reason Patient is not Primary Learner: dementia , location of wounds Learning Preferences/Education Level/Primary Language Learning Preference: Explanation, Printed Material Highest Education Level: High School Preferred Language: English Cognitive Barrier Language Barrier: No Translator Needed: No Memory Deficit: No Emotional Barrier: No Cultural/Religious Beliefs Affecting Medical Care: No Physical Barrier Impaired Vision: Yes Glasses Impaired Hearing: No Decreased Hand dexterity: No Knowledge/Comprehension Knowledge Level: Medium Comprehension Level: Medium Ability to understand written instructions: Medium Ability to understand verbal instructions: Medium Motivation Anxiety Level: Calm Cooperation: Cooperative Education Importance: Acknowledges Need Interest in Health Problems: Asks Questions Perception: Coherent Willingness to Engage in Self-Management High Activities: Readiness to Engage in Self-Management High Activities: Electronic Signature(s) Signed: 11/21/2020 9:05:33 PM By: Deon Pilling Previous Signature: 11/18/2020 8:58:48 PM Version By: Deon Pilling Entered By: Deon Pilling on 11/21/2020  21:02:00 -------------------------------------------------------------------------------- Fall Risk Assessment Details Patient Name: Date of Service: Tanya Jensen, Tanya NCY L. 10/29/2020 10:30 A M Medical Record Number: [id number] Patient Account Number: [id number] Date of Birth/Sex: Treating RN: 1946-03-10 (75 y.o. Tanya Jensen Primary Care Arshia Spellman: Shirline Frees Other Clinician: Referring Rylen Swindler: Treating Mellony Danziger/Extender: Ponciano Ort in Treatment: 0 Fall Risk Assessment Items Have you had 2 or more falls in the last 12 monthso 0 Yes Have you had any fall  that resulted in injury in the last 12 monthso 0 Yes FALLS RISK SCREEN History of falling - immediate or within 3 months 0 No Secondary diagnosis (Do you have 2 or more medical diagnoseso) 0 No Ambulatory aid None/bed rest/wheelchair/nurse 0 Yes Crutches/cane/walker 0 No Furniture 0 No Intravenous therapy Access/Saline/Heparin Lock 0 No Gait/Transferring Normal/ bed rest/ wheelchair 0 Yes Weak (short steps with or without shuffle, stooped but able to lift head while walking, may seek 0 No support from furniture) Impaired (short steps with shuffle, may have difficulty arising from chair, head down, impaired 0 No balance) Mental Status Oriented to own ability 0 No Electronic Signature(s) Signed: 11/21/2020 9:05:33 PM By: Deon Pilling Previous Signature: 11/18/2020 8:58:48 PM Version By: Deon Pilling Entered By: Deon Pilling on 11/21/2020 21:02:06 -------------------------------------------------------------------------------- Foot Assessment Details Patient Name: Date of Service: Tanya Jensen, Tanya NCY L. 10/29/2020 10:30 A M Medical Record Number: [id number] Patient Account Number: [id number] Date of Birth/Sex: Treating RN: Jan 02, 1946 (75 y.o. [name], [name] Primary Care Ephrata Verville: Shirline Frees Other Clinician: Referring Niobe Dick: Treating Hermon Zea/Extender: Ponciano Ort in Treatment: 0 Foot Assessment Items [x]  Unable to perform due to altered mental status Site Locations + = Sensation present, - = Sensation absent, C = Callus, U = Ulcer R = Redness, W = Warmth, M = Maceration, PU = Pre-ulcerative lesion F = Fissure, S = Swelling, D = Dryness Assessment Right: Left: Other Deformity: No No Prior Foot Ulcer: No No Prior Amputation: No No Charcot Joint: No No Ambulatory Status: Gait: Electronic Signature(s) Signed: 11/21/2020 9:05:33 PM By: Deon Pilling Previous Signature: 11/18/2020 8:58:48 PM Version By: Deon Pilling Entered By: Deon Pilling on 11/21/2020 21:02:17 -------------------------------------------------------------------------------- Nutrition Risk Screening Details Patient Name: Date of Service: Tanya Jensen 10/29/2020 10:30 A M Medical Record Number: [id number] Patient Account Number: [id number] Date of Birth/Sex: Treating RN: 1945-11-22 (75 y.o. Tanya Jensen Primary Care Brookie Wayment: Shirline Frees Other Clinician: Referring Evalynn Hankins: Treating Mackenna Kamer/Extender: Ponciano Ort in Treatment: 0 Height (in): 67 Weight (lbs): Body Mass Index (BMI): Nutrition Risk Screening Items Score Screening NUTRITION RISK SCREEN: I have an illness or condition that made me change the kind and/or amount of food I eat 2 Yes I eat fewer than two meals per day 0 No I eat few fruits and vegetables, or milk products 0 No I have three or more drinks of beer, liquor or wine almost every day 0 No I have tooth or mouth problems that make it hard for me to eat 0 No I don't always have enough money to buy the food I need 0 No I eat alone most of the time 0 No I take three or more different prescribed or over-the-counter drugs a day 0 No Without wanting to, I have lost or gained 10 pounds in the last six months 0 No I am not always physically able to shop, cook and/or feed myself 0 No Nutrition  Protocols Good Risk Protocol Moderate Risk Protocol High Risk Proctocol Risk Level: Good Risk Score: 2 Electronic Signature(s) Signed: 11/21/2020 9:05:33 PM By: Deon Pilling Previous Signature: 11/18/2020 8:58:48 PM Version By: Deon Pilling Entered By: Deon Pilling on 11/21/2020 21:02:13

## 2020-11-21 NOTE — Progress Notes (Signed)
Tanya Jensen ([id number]) Visit Report for 10/29/2020 Allergy List Details Patient Name: Date of Service: Tanya Jensen 10/29/2020 10:30 A M Medical Record Number: [id number] Patient Account Number: [id number] Date of Birth/Sex: Treating RN: [birth year] Jensen (75 y.o. Tanya Jensen, Tanya Jensen Primary Care Tanya Jensen: Tanya Jensen Other Clinician: Referring Tanya Jensen: Treating Tanya Jensen/Extender: Tanya Jensen in Treatment: 0 Allergies Active Allergies No Known Drug Allergies Allergy Notes Electronic Signature(s) Signed: 11/21/2020 9:05:33 PM By: Deon Pilling Previous Signature: 11/18/2020 8:58:48 PM Version By: Deon Pilling Entered By: Deon Pilling on 11/21/2020 21:01:37 -------------------------------------------------------------------------------- Arrival Information Details Patient Name: Date of Service: Tanya Jensen, Tanya NCY L. 10/29/2020 10:30 A M Medical Record Number: [id number] Patient Account Number: [id number] Date of Birth/Sex: Treating RN: [birth year] (75 y.o. Tanya Jensen, Tanya Jensen Primary Care Tanya Jensen: Tanya Jensen Other Clinician: Referring Selma Mink: Treating Tanya Jensen/Extender: Tanya Jensen in Treatment: 0 Visit Information Patient Arrived: Wheel Chair Arrival Time: 11:00 Accompanied By: spouse Transfer Assistance: EasyPivot Patient Lift Patient Identification Verified: Yes Secondary Verification Process Completed: Yes Patient Requires Transmission-Based Precautions: No Patient Has Alerts: No Electronic Signature(s) Signed: 11/21/2020 9:05:33 PM By: Deon Pilling Previous Signature: 11/18/2020 8:58:48 PM Version By: Deon Pilling Entered By: Deon Pilling on 11/21/2020 21:01:24 -------------------------------------------------------------------------------- Clinic Level of Care Assessment Details Patient Name: Date of Service: Tanya Ou L. 10/29/2020 10:30 A M Medical Record Number: [id number] Patient Account Number:  [id number] Date of Birth/Sex: Treating RN: Tanya Jensen (75 y.o. Tanya Jensen, Tanya Jensen Primary Care Zyliah Schier: Tanya Jensen Other Clinician: Referring Tanya Jensen: Treating Tanya Jensen/Extender: Tanya Jensen in Treatment: 0 Clinic Level of Care Assessment Items TOOL 1 Quantity Score X- 1 0 Use when EandM and Procedure is performed on INITIAL visit ASSESSMENTS - Nursing Assessment / Reassessment X- 1 20 General Physical Exam (combine w/ comprehensive assessment (listed just below) when performed on new pt. evals) X- 1 25 Comprehensive Assessment (HX, ROS, Risk Assessments, Wounds Hx, etc.) ASSESSMENTS - Wound and Skin Assessment / Reassessment []  - 0 Dermatologic / Skin Assessment (not related to wound area) ASSESSMENTS - Ostomy and/or Continence Assessment and Care []  - 0 Incontinence Assessment and Management []  - 0 Ostomy Care Assessment and Management (repouching, etc.) PROCESS - Coordination of Care X - Simple Patient / Family Education for ongoing care 1 15 X- 1 20 Complex (extensive) Patient / Family Education for ongoing care X- 1 10 Staff obtains Programmer, systems, Records, T Results / Process Orders est X- 1 10 Staff telephones HHA, Nursing Homes / Clarify orders / etc []  - 0 Routine Transfer to another Facility (non-emergent condition) []  - 0 Routine Hospital Admission (non-emergent condition) X- 1 15 New Admissions / Biomedical engineer / Ordering NPWT Apligraf, etc. , []  - 0 Emergency Hospital Admission (emergent condition) PROCESS - Special Needs []  - 0 Pediatric / Minor Patient Management []  - 0 Isolation Patient Management []  - 0 Hearing / Language / Visual special needs []  - 0 Assessment of Community assistance (transportation, D/C planning, etc.) []  - 0 Additional assistance / Altered mentation []  - 0 Support Surface(s) Assessment (bed, cushion, seat, etc.) INTERVENTIONS - Miscellaneous []  - 0 External ear exam []  - 0 Patient  Transfer (multiple staff / Civil Service fast streamer / Similar devices) []  - 0 Simple Staple / Suture removal (25 or less) []  - 0 Complex Staple / Suture removal (26 or more) []  - 0 Hypo/Hyperglycemic Management (do not check if billed separately) X- 1 15 Ankle / Brachial Index (ABI) - do not check if billed  separately Has the patient been seen at the hospital within the last three years: Yes Total Score: 130 Level Of Care: New/Established - Level 4 Electronic Signature(s) Signed: 10/29/2020 5:48:53 PM By: Rhae Hammock RN Entered By: Rhae Hammock on 10/29/2020 17:14:40 -------------------------------------------------------------------------------- Encounter Discharge Information Details Patient Name: Date of Service: Tanya Jensen, Tanya NCY L. 10/29/2020 10:30 A M Medical Record Number: [id number] Patient Account Number: [id number] Date of Birth/Sex: Treating RN: [birth year] Jensen (75 y.o. Tanya Jensen Primary Care Tanya Jensen: Tanya Jensen Other Clinician: Referring Tanya Jensen: Treating Tanya Jensen/Extender: Tanya Jensen in Treatment: 0 Encounter Discharge Information Items Post Procedure Vitals Discharge Condition: Stable Unable to obtain vitals Reason: dementia, agitation Ambulatory Status: Wheelchair Discharge Destination: Home Transportation: Private Auto Accompanied By: Husband Schedule Follow-up Appointment: Yes Clinical Summary of Care: Provided on 10/29/2020 Form Type Recipient Paper Patient Patient Electronic Signature(s) Signed: 10/29/2020 1:16:07 PM By: Lorrin Jackson Entered By: Lorrin Jackson on 10/29/2020 13:16:07 -------------------------------------------------------------------------------- Lower Extremity Assessment Details Patient Name: Date of Service: Tanya Jensen NCY L. 10/29/2020 10:30 A M Medical Record Number: [id number] Patient Account Number: [id number] Date of Birth/Sex: Treating RN: [birth year] (75 y.o. Tanya Jensen Primary Care  Tiwanna Tuch: Tanya Jensen Other Clinician: Referring Hortensia Duffin: Treating Jaysin Gayler/Extender: Tanya Jensen in Treatment: 0 Edema Assessment Assessed: Shirlyn Goltz: No] Patrice Paradise: No] E[Left: dema] [Right: :] Calf Left: Right: Point of Measurement: 30 cm From Medial Instep 36 cm 37.5 cm Ankle Left: Right: Point of Measurement: 9 cm From Medial Instep 24 cm 22.5 cm Knee To Floor Left: Right: From Medial Instep 44 cm 44 cm Vascular Assessment Pulses: Dorsalis Pedis Palpable: [Left:Yes] [Right:Yes] Electronic Signature(s) Signed: 11/21/2020 9:05:33 PM By: Deon Pilling Previous Signature: 11/18/2020 8:58:48 PM Version By: Deon Pilling Entered By: Deon Pilling on 11/21/2020 21:02:23 -------------------------------------------------------------------------------- Multi Wound Chart Details Patient Name: Date of Service: Tanya Jensen, Tanya NCY L. 10/29/2020 10:30 A M Medical Record Number: [id number] Patient Account Number: [id number] Date of Birth/Sex: Treating RN: [birth year] Jensen (75 y.o. Tanya Jensen, Tanya Jensen Primary Care Ahmod Gillespie: Tanya Jensen Other Clinician: Referring Sharnay Cashion: Treating Daniella Dewberry/Extender: Tanya Jensen in Treatment: 0 Photos: [1:No Photos Sacrum] [10:No Photos Right, Distal Darien Ramus oe] [2:No Photos Left Trochanter] Wound Location: [1:Pressure Injury] [10:Pressure Injury] [2:Pressure Injury] Wounding Event: [1:Pressure Ulcer] [10:Pressure Ulcer] [2:Pressure Ulcer] Primary Etiology: [1:Cataracts, Glaucoma, Hypertension,] [10:Cataracts, Glaucoma, Hypertension,] [2:Cataracts, Glaucoma, Hypertension,] Comorbid History: [1:Lupus Erythematosus, Osteoarthritis, Dementia 07/29/2020] [10:Lupus Erythematosus, Osteoarthritis, Dementia 07/29/2020] [2:Lupus Erythematosus, Osteoarthritis, Dementia 07/29/2020] Date Acquired: [1:0] [10:0] [2:0] Weeks of Treatment: [1:Open] [10:Open] [2:Open] Wound Status: [1:10.8x15.5x0.9] [10:0.8x1.9x0.1]  [2:2.2x1x1.5] Measurements L x W x D (cm) [1:131.476] [10:1.194] [2:1.728] A (cm) : rea [1:118.328] [10:0.119] [2:2.592] Volume (cm) : [2:10] Position 1 (o'clock): [2:2.5] Maximum Distance 1 (cm): [2:11] Position 2 (o'clock): [2:2.3] Maximum Distance 2 (cm): [2:12] Position 3 (o'clock): [2:2.7] Maximum Distance 3 (cm): [2:1] Position 4 (o'clock): [2:2.3] Maximum Distance 4 (cm): [2:2] Position 5 (o'clock): [2:1.6] Maximum Distance 5 (cm): [2:3] Position 6 (o'clock): [2:1.8] Maximum Distance 6 (cm): [1:No] [10:No] [2:Yes] Tunneling: [1:Unstageable/Unclassified] [10:Unstageable/Unclassified] [2:Category/Stage IV] Classification: [1:Large] [10:Medium] [2:Large] Exudate A mount: [1:Serosanguineous] [10:Serosanguineous] [2:Purulent] Exudate Type: [1:red, brown] [10:red, brown] [2:yellow, brown, green] Exudate Color: [1:Yes] [10:No] [2:Yes] Foul Odor A Cleansing: [1:fter No] [10:N/A] [2:No] Odor A nticipated Due to Product Use: [1:Flat and Intact] [10:Indistinct, nonvisible] [2:Thickened] Wound Margin: [1:Medium (34-66%)] [10:None Present (0%)] [2:Small (1-33%)] Granulation A mount: [1:Red] [10:N/A] [2:Red] Granulation Quality: [1:Medium (34-66%)] [10:Large (67-100%)] [2:Large (67-100%)] Necrotic A mount: [1:Eschar, Adherent Slough] [10:Adherent Slough] [2:N/A] Necrotic Tissue: [1:Fat  Layer (Subcutaneous Tissue): Yes Fat Layer (Subcutaneous Tissue): Yes Fat Layer (Subcutaneous Tissue): Yes] Exposed Structures: [1:Fascia: No Tendon: No Muscle: No Joint: No Bone: No None] [10:Fascia: No Tendon: No Muscle: No Joint: No Bone: No None] [2:Muscle: Yes Fascia: No Tendon: No Joint: No Bone: No None] Epithelialization: [1:Debridement - Excisional] [10:N/A] [2:N/A] Debridement: Pre-procedure Verification/Time Out 12:17 [10:N/A] [2:N/A] Taken: [1:Lidocaine] [10:N/A] [2:N/A] Pain Control: [1:Necrotic/Eschar, Subcutaneous,] [10:N/A] [2:N/A] Tissue Debrided: [1:Slough Skin/Subcutaneous Tissue]  [10:N/A] [2:N/A] Level: [1:167.4] [10:N/A] [2:N/A] Debridement A (sq cm): [1:rea Blade, Curette, Forceps] [10:N/A] [2:N/A] Instrument: [1:Minimum] [10:N/A] [2:N/A] Bleeding: [1:Pressure] [10:N/A] [2:N/A] Hemostasis Achieved: [1:0] [10:N/A] [2:N/A] Procedural Pain: [1:0] [10:N/A] [2:N/A] Post Procedural Pain: [1:Procedure was tolerated well] [10:N/A] [2:N/A] Debridement Treatment Response: [1:10.8x15.5x0.9] [10:N/A] [2:N/A] Post Debridement Measurements L x W x D (cm) [1:118.328] [10:N/A] [2:N/A] Post Debridement Volume: (cm) [1:Unstageable/Unclassified] [10:N/A] [2:N/A] Post Debridement Stage: [1:N/A] [10:N/A] [2:N/A] Assessment Notes: [1:Debridement] [10:N/A] [2:N/A] Wound Number: 3 [id number] 5  Photos: No Photos No Photos No Photos Right, Lateral Malleolus Right Calcaneus Left Calcaneus Wound Location: Pressure Injury Pressure Injury Pressure Injury Wounding Event: Pressure Ulcer Pressure Ulcer Pressure Ulcer Primary Etiology: Cataracts, Glaucoma, Hypertension, Cataracts, Glaucoma, Hypertension, Cataracts, Glaucoma, Hypertension, Comorbid History: Lupus Erythematosus, Osteoarthritis, Lupus Erythematosus, Osteoarthritis, Lupus Erythematosus, Osteoarthritis, Dementia Dementia Dementia 07/29/2020 08/29/2020 07/29/2020 Date Acquired: 0 0 0 Weeks of Treatment: Open Open Open Wound Status: 1.4x2x0.1 4x2x0.9 5x7.5x0.1 Measurements L x W x D (cm) 2.199 6.283 29.452 A (cm) : rea 0.22 5.655 2.945 Volume (cm) : No No No Tunneling: Unstageable/Unclassified Unstageable/Unclassified Category/Stage II Classification: Medium Medium None Present Exudate A mount: Serosanguineous Serosanguineous N/A Exudate Type: red, brown red, brown N/A Exudate Color: No No No Foul Odor A Cleansing: fter N/A N/A N/A Odor A nticipated Due to Product Use: Distinct, outline attached Fibrotic scar, thickened scar Indistinct, nonvisible Wound Margin: None Present (0%) Small (1-33%) None Present  (0%) Granulation A mount: N/A Red N/A Granulation Quality: Large (67-100%) Large (67-100%) None Present (0%) Necrotic A mount: Eschar, Adherent Slough Eschar, Adherent Slough N/A Necrotic Tissue: Fat Layer (Subcutaneous Tissue): Yes Fat Layer (Subcutaneous Tissue): Yes Fascia: No Exposed Structures: Fascia: No Fascia: No Fat Layer (Subcutaneous Tissue): No Tendon: No Tendon: No Tendon: No Muscle: No Muscle: No Muscle: No Joint: No Joint: No Joint: No Bone: No Bone: No Bone: No None None None Epithelialization: N/A Debridement - Excisional N/A Debridement: Pre-procedure Verification/Time Out N/A 12:25 N/A Taken: N/A Lidocaine N/A Pain Control: N/A Necrotic/Eschar, Subcutaneous, N/A Tissue Debrided: Slough N/A Skin/Subcutaneous Tissue N/A Level: N/A 8 N/A Debridement A (sq cm): rea N/A Blade, Curette, Forceps N/A Instrument: N/A Minimum N/A Bleeding: N/A Pressure N/A Hemostasis Achieved: N/A 0 N/A Procedural Pain: N/A 0 N/A Post Procedural Pain: N/A Procedure was tolerated well N/A Debridement Treatment Response: N/A 4x2x0.9 N/A Post Debridement Measurements L x W x D (cm) N/A 5.655 N/A Post Debridement Volume: (cm) N/A Unstageable/Unclassified N/A Post Debridement Stage: N/A N/A skin intact - blistered - boggy Assessment Notes: N/A Debridement N/A Procedures Performed: Wound Number: [id number]  Photos: No Photos No Photos No Photos Left, Medial Foot Left, Lateral Malleolus Left T Second oe Wound Location: Pressure Injury Pressure Injury Pressure Injury Wounding Event: Pressure Ulcer Pressure Ulcer Pressure Ulcer Primary Etiology: Cataracts, Glaucoma, Hypertension, Cataracts, Glaucoma, Hypertension, Cataracts, Glaucoma, Hypertension, Comorbid History: Lupus Erythematosus, Osteoarthritis, Lupus Erythematosus, Osteoarthritis, Lupus Erythematosus, Osteoarthritis, Dementia Dementia Dementia 07/29/2020 07/29/2020 07/29/2020 Date Acquired: 0 0 0 Weeks  of Treatment: Open Open Open Wound Status: 0.7x0.7x0.1 7.1x2.1x0.2 0.5x0.9x0.1 Measurements L x W  x D (cm) 0.385 11.71 0.353 A (cm) : rea 0.038 2.342 0.035 Volume (cm) : N/A No No Tunneling: Unstageable/Unclassified Unstageable/Unclassified Unstageable/Unclassified Classification: Medium Medium Medium Exudate A mount: Serosanguineous Serosanguineous Serosanguineous Exudate Type: red, brown red, brown red, brown Exudate Color: No No No Foul Odor A Cleansing: fter N/A N/A N/A Odor A nticipated Due to Product Use: Flat and Intact Flat and Intact Flat and Intact Wound Margin: Small (1-33%) Small (1-33%) None Present (0%) Granulation Amount: N/A Red N/A Granulation Quality: Large (67-100%) Large (67-100%) Large (67-100%) Necrotic Amount: Eschar Eschar Adherent Slough Necrotic Tissue: Fat Layer (Subcutaneous Tissue): Yes Fat Layer (Subcutaneous Tissue): Yes Fat Layer (Subcutaneous Tissue): Yes Exposed Structures: Fascia: No Fascia: No Fascia: No Tendon: No Tendon: No Tendon: No Muscle: No Muscle: No Muscle: No Joint: No Joint: No Joint: No Bone: No Bone: No Bone: No None None None Epithelialization: N/A N/A N/A Debridement: N/A N/A N/A Pain Control: N/A N/A N/A Tissue Debrided: N/A N/A N/A Level: N/A N/A N/A Debridement A (sq cm): rea N/A N/A N/A Instrument: N/A N/A N/A Bleeding: N/A N/A N/A Hemostasis A chieved: N/A N/A N/A Procedural Pain: N/A N/A N/A Post Procedural Pain: Debridement Treatment Response: N/A N/A N/A Post Debridement Measurements L x N/A N/A N/A W x D (cm) N/A N/A N/A Post Debridement Volume: (cm) N/A N/A N/A Post Debridement Stage: N/A N/A N/A Assessment Notes: N/A N/A N/A Procedures Performed: Wound Number: 9 N/A N/A Photos: No Photos N/A N/A Left, Lateral T Great oe N/A N/A Wound Location: Pressure Injury N/A N/A Wounding Event: Pressure Ulcer N/A N/A Primary Etiology: Cataracts, Glaucoma, Hypertension,  N/A N/A Comorbid History: Lupus Erythematosus, Osteoarthritis, Dementia 07/29/2020 N/A N/A Date Acquired: 0 N/A N/A Weeks of Treatment: Open N/A N/A Wound Status: 0.7x0.9x0.2 N/A N/A Measurements L x W x D (cm) 0.495 N/A N/A A (cm) : rea 0.099 N/A N/A Volume (cm) : No N/A N/A Tunneling: Unstageable/Unclassified N/A N/A Classification: Medium N/A N/A Exudate A mount: Serosanguineous N/A N/A Exudate Type: red, brown N/A N/A Exudate Color: No N/A N/A Foul Odor A Cleansing: fter N/A N/A N/A Odor A nticipated Due to Product Use: Flat and Intact N/A N/A Wound Margin: None Present (0%) N/A N/A Granulation A mount: N/A N/A N/A Granulation Quality: Large (67-100%) N/A N/A Necrotic A mount: Adherent Slough N/A N/A Necrotic Tissue: Fat Layer (Subcutaneous Tissue): Yes N/A N/A Exposed Structures: Fascia: No Tendon: No Muscle: No Joint: No Bone: No None N/A N/A Epithelialization: N/A N/A N/A Debridement: N/A N/A N/A Pain Control: N/A N/A N/A Tissue Debrided: N/A N/A N/A Level: N/A N/A N/A Debridement A (sq cm): rea N/A N/A N/A Instrument: N/A N/A N/A Bleeding: N/A N/A N/A Hemostasis A chieved: N/A N/A N/A Procedural Pain: N/A N/A N/A Post Procedural Pain: Debridement Treatment Response: N/A N/A N/A Post Debridement Measurements L x N/A N/A N/A W x D (cm) N/A N/A N/A Post Debridement Volume: (cm) N/A N/A N/A Post Debridement Stage: N/A N/A N/A Assessment Notes: N/A N/A N/A Procedures Performed: Treatment Notes Electronic Signature(s) Signed: 10/29/2020 5:29:17 PM By: Linton Ham MD Signed: 10/29/2020 5:48:53 PM By: Rhae Hammock RN Entered By: Linton Ham on 10/29/2020 12:47:36 -------------------------------------------------------------------------------- Multi-Disciplinary Care Plan Details Patient Name: Date of Service: Tanya Jensen, Tanya NCY L. 10/29/2020 10:30 A M Medical Record Number: [id number] Patient Account Number:  [id number] Date of Birth/Sex: Treating RN: [birth year] (75 y.o. Tanya Jensen, Tanya Jensen Primary Care Baden Betsch: Tanya Jensen Other Clinician: Referring Rhyan Wolters: Treating Eyoel Throgmorton/Extender: Tanya Jensen in Treatment:  0 Active Inactive Orientation to the Wound Care Program Nursing Diagnoses: Knowledge deficit related to the wound healing center program Goals: Patient/caregiver will verbalize understanding of the [place] Date Initiated: 10/29/2020 Target Resolution Date: 11/08/2020 Goal Status: Active Interventions: Provide education on orientation to the wound center Notes: Wound/Skin Impairment Nursing Diagnoses: Impaired tissue integrity Knowledge deficit related to ulceration/compromised skin integrity Goals: Patient will have a decrease in wound volume by X% from date: (specify in notes) Date Initiated: 10/29/2020 Target Resolution Date: 11/07/2020 Goal Status: Active Patient/caregiver will verbalize understanding of skin care regimen Date Initiated: 10/29/2020 Target Resolution Date: 11/07/2020 Goal Status: Active Ulcer/skin breakdown will have a volume reduction of 30% by week 4 Date Initiated: 10/29/2020 Target Resolution Date: 12/05/2020 Goal Status: Active Ulcer/skin breakdown will have a volume reduction of 50% by week 8 Date Initiated: 10/29/2020 Target Resolution Date: 11/07/2020 Goal Status: Active Interventions: Assess patient/caregiver ability to obtain necessary supplies Assess patient/caregiver ability to perform ulcer/skin care regimen upon admission and as needed Assess ulceration(s) every visit Notes: Electronic Signature(s) Signed: 10/29/2020 5:48:53 PM By: Rhae Hammock RN Entered By: Rhae Hammock on 10/29/2020 17:13:58 -------------------------------------------------------------------------------- Pain Assessment Details Patient Name: Date of Service: Tanya Ou L. 10/29/2020 10:30 A M Medical Record  Number: [id number] Patient Account Number: [id number] Date of Birth/Sex: Treating RN: [birth year] (75 y.o. Tanya Jensen, Tanya Jensen Primary Care Liberti Appleton: Tanya Jensen Other Clinician: Referring Evelean Bigler: Treating Anshul Meddings/Extender: Tanya Jensen in Treatment: 0 Active Problems Location of Pain Severity and Description of Pain Patient Has Paino Patient Unable to Respond Site Locations Pain Management and Medication Current Pain Management: Notes unable to assess questions due to dementia Electronic Signature(s) Signed: 11/21/2020 9:05:33 PM By: Deon Pilling Previous Signature: 11/18/2020 8:58:48 PM Version By: Deon Pilling Entered By: Deon Pilling on 11/21/2020 21:05:11 -------------------------------------------------------------------------------- Patient/Caregiver Education Details Patient Name: Date of Service: Tanya Jensen 6/28/2022andnbsp10:30 [place] Record Number: [id number] Patient Account Number: [id number] Date of Birth/Gender: Treating RN: [birth year] (75 y.o. Tanya Jensen, Tanya Jensen Primary Care Physician: Tanya Jensen Other Clinician: Referring Physician: Treating Physician/Extender: Tanya Jensen in Treatment: 0 Education Assessment Education Provided To: Patient Education Topics Provided Welcome T [facility] [place]: o Methods: Explain/Verbal Responses: State content correctly Electronic Signature(s) Signed: 10/29/2020 5:48:53 PM By: Rhae Hammock RN Entered By: Rhae Hammock on 10/29/2020 17:14:06 -------------------------------------------------------------------------------- Wound Assessment Details Patient Name: Date of Service: Tanya Ou L. 10/29/2020 10:30 A M Medical Record Number: [id number] Patient Account Number: [id number] Date of Birth/Sex: Treating RN: [birth year] (75 y.o. Tanya Jensen, [name] Primary Care Brittanny Levenhagen: Tanya Jensen Other Clinician: Referring  Maziah Keeling: Treating Hadiya Spoerl/Extender: Tanya Jensen in Treatment: 0 Wound Status Wound Number: 1 Primary Pressure Ulcer Etiology: Wound Location: Sacrum Wound Open Wounding Event: Pressure Injury Status: Date Acquired: 07/29/2020 Comorbid Cataracts, Glaucoma, Hypertension, Lupus Erythematosus, Weeks Of Treatment: 0 History: Osteoarthritis, Dementia Clustered Wound: No Photos Wound Measurements Length: (cm) 10.8 Width: (cm) 15.5 Depth: (cm) 0.9 Area: (cm) 131.476 Volume: (cm) 118.328 % Reduction in Area: 0% % Reduction in Volume: 0% Epithelialization: None Tunneling: No Undermining: No Wound Description Classification: Unstageable/Unclassified Wound Margin: Flat and Intact Exudate Amount: Large Exudate Type: Serosanguineous Exudate Color: red, brown Foul Odor After Cleansing: Yes Due to Product Use: No Slough/Fibrino Yes Wound Bed Granulation Amount: Medium (34-66%) Exposed Structure Granulation Quality: Red Fascia Exposed: No Necrotic Amount: Medium (34-66%) Fat Layer (Subcutaneous Tissue) Exposed: Yes Necrotic Quality: Eschar, Adherent Slough Tendon Exposed: No Muscle Exposed:  No Joint Exposed: No Bone Exposed: No Treatment Notes Wound #1 (Sacrum) Cleanser Wound Cleanser Discharge Instruction: Cleanse the wound with wound cleanser prior to applying a clean dressing using gauze sponges, not tissue or cotton balls. Peri-Wound Care Zinc Oxide Ointment 30g tube Discharge Instruction: Apply Zinc Oxide to periwound with each dressing change Topical Primary Dressing KerraCel Ag Gelling Fiber Dressing, 4x5 in (silver alginate) Discharge Instruction: Apply silver alginate to wound bed as instructed Secondary Dressing Woven Gauze Sponge, Non-Sterile 4x4 in Discharge Instruction: Apply over primary dressing as directed. ABD Pad, 5x9 Discharge Instruction: Apply over primary dressing as directed. Secured With 41M Medipore H Soft Cloth  Surgical T 4 x 2 (in/yd) ape Discharge Instruction: Secure dressing with tape as directed. Compression Wrap Compression Stockings Add-Ons Electronic Signature(s) Signed: 11/21/2020 9:05:33 PM By: Deon Pilling Previous Signature: 10/30/2020 10:48:43 AM Version By: Sandre Kitty Previous Signature: 11/18/2020 8:58:48 PM Version By: Deon Pilling Entered By: Deon Pilling on 11/21/2020 21:02:45 -------------------------------------------------------------------------------- Wound Assessment Details Patient Name: Date of Service: Tanya Jensen, Tanya NCY L. 10/29/2020 10:30 A M Medical Record Number: [id number] Patient Account Number: [id number] Date of Birth/Sex: Treating RN: [birth year] (75 y.o. Tanya Jensen, [name] Primary Care Mikell Camp: Tanya Jensen Other Clinician: Referring Adian Jablonowski: Treating Cailin Gebel/Extender: Tanya Jensen in Treatment: 0 Wound Status Wound Number: 10 Primary Pressure Ulcer Etiology: Wound Location: Right, Distal T Great oe Wound Open Wounding Event: Pressure Injury Status: Date Acquired: 07/29/2020 Comorbid Cataracts, Glaucoma, Hypertension, Lupus Erythematosus, Weeks Of Treatment: 0 History: Osteoarthritis, Dementia Clustered Wound: No Photos Wound Measurements Length: (cm) 0.8 Width: (cm) 1.9 Depth: (cm) 0.1 Area: (cm) 1.194 Volume: (cm) 0.119 % Reduction in Area: 0% % Reduction in Volume: 0% Epithelialization: None Tunneling: No Undermining: No Wound Description Classification: Unstageable/Unclassified Wound Margin: Indistinct, nonvisible Exudate Amount: Medium Exudate Type: Serosanguineous Exudate Color: red, brown Foul Odor After Cleansing: No Slough/Fibrino Yes Wound Bed Granulation Amount: None Present (0%) Exposed Structure Necrotic Amount: Large (67-100%) Fascia Exposed: No Necrotic Quality: Adherent Slough Fat Layer (Subcutaneous Tissue) Exposed: Yes Tendon Exposed: No Muscle Exposed: No Joint Exposed:  No Bone Exposed: No Treatment Notes Wound #10 (Toe Great) Wound Laterality: Right, Distal Cleanser Wound Cleanser Discharge Instruction: Cleanse the wound with wound cleanser prior to applying a clean dressing using gauze sponges, not tissue or cotton balls. Peri-Wound Care Zinc Oxide Ointment 30g tube Discharge Instruction: Apply Zinc Oxide to periwound with each dressing change Topical Primary Dressing KerraCel Ag Gelling Fiber Dressing, 4x5 in (silver alginate) Discharge Instruction: Apply silver alginate to wound bed as instructed Secondary Dressing Woven Gauze Sponge, Non-Sterile 4x4 in Discharge Instruction: Apply over primary dressing as directed. ABD Pad, 5x9 Discharge Instruction: Apply over primary dressing as directed. Secured With [employer], 4.5x3.1 (in/yd) Discharge Instruction: Secure with Kerlix as directed. 41M Medipore H Soft Cloth Surgical T 4 x 2 (in/yd) ape Discharge Instruction: Secure dressing with tape as directed. Compression Wrap Compression Stockings Add-Ons Electronic Signature(s) Signed: 11/21/2020 9:05:33 PM By: Deon Pilling Previous Signature: 10/30/2020 10:48:43 AM Version By: Sandre Kitty Previous Signature: 11/18/2020 8:58:48 PM Version By: Deon Pilling Entered By: Deon Pilling on 11/21/2020 21:02:57 -------------------------------------------------------------------------------- Wound Assessment Details Patient Name: Date of Service: Tanya Jensen, Tanya NCY L. 10/29/2020 10:30 A M Medical Record Number: [id number] Patient Account Number: [id number] Date of Birth/Sex: Treating RN: [birth year] (75 y.o. Tanya Jensen Primary Care Fredericka Bottcher: Tanya Jensen Other Clinician: Referring Deneisha Dade: Treating Ashtian Villacis/Extender: Tanya Jensen in Treatment: 0 Wound Status Wound Number: 2 Primary Pressure  Ulcer Etiology: Wound Location: Left Trochanter Wound Open Wounding Event: Pressure Injury Status: Date  Acquired: 07/29/2020 Comorbid Cataracts, Glaucoma, Hypertension, Lupus Erythematosus, Weeks Of Treatment: 0 History: Osteoarthritis, Dementia Clustered Wound: No Photos Wound Measurements Length: (cm) 2.2 % R Width: (cm) 1 % R Depth: (cm) 1.5 Epi Area: (cm) 1.728 Tu Volume: (cm) 2.592 eduction in Area: 0% eduction in Volume: 0% thelialization: None nneling: Yes Location 1 Position (o'clock): 10 Maximum Distance: (cm) 2.5 Location 2 Position (o'clock): 11 Maximum Distance: (cm) 2.3 Location 3 Position (o'clock): 12 Maximum Distance: (cm) 2.7 Location 4 Position (o'clock): 1 Maximum Distance: (cm) 2.3 Location 5 Position (o'clock): 2 Maximum Distance: (cm) 1.6 Location 6 Position (o'clock): 3 Maximum Distance: (cm) 1.8 Undermining: No Wound Description Classification: Category/Stage IV Fou Wound Margin: Thickened Due Exudate Amount: Large Slo Exudate Type: Purulent Exudate Color: yellow, brown, green l Odor After Cleansing: Yes to Product Use: No ugh/Fibrino Yes Wound Bed Granulation Amount: Small (1-33%) Exposed Structure Granulation Quality: Red Fascia Exposed: No Necrotic Amount: Large (67-100%) Fat Layer (Subcutaneous Tissue) Exposed: Yes Tendon Exposed: No Muscle Exposed: Yes Necrosis of Muscle: Yes Joint Exposed: No Bone Exposed: No Treatment Notes Wound #2 (Trochanter) Wound Laterality: Left Cleanser Wound Cleanser Discharge Instruction: Cleanse the wound with wound cleanser prior to applying a clean dressing using gauze sponges, not tissue or cotton balls. Peri-Wound Care Zinc Oxide Ointment 30g tube Discharge Instruction: Apply Zinc Oxide to periwound with each dressing change Topical Primary Dressing KerraCel Ag Gelling Fiber Dressing, 4x5 in (silver alginate) Discharge Instruction: Apply silver alginate to wound bed as instructed Secondary Dressing Woven Gauze Sponge, Non-Sterile 4x4 in Discharge Instruction: Apply over primary dressing  as directed. ABD Pad, 5x9 Discharge Instruction: Apply over primary dressing as directed. Secured With 13M Medipore H Soft Cloth Surgical T 4 x 2 (in/yd) ape Discharge Instruction: Secure dressing with tape as directed. Compression Wrap Compression Stockings Add-Ons Electronic Signature(s) Signed: 11/21/2020 9:05:33 PM By: Deon Pilling Previous Signature: 10/30/2020 10:48:43 AM Version By: Sandre Kitty Previous Signature: 11/18/2020 8:58:48 PM Version By: Deon Pilling Entered By: Deon Pilling on 11/21/2020 21:03:12 -------------------------------------------------------------------------------- Wound Assessment Details Patient Name: Date of Service: Tanya Jensen, Tanya NCY L. 10/29/2020 10:30 A M Medical Record Number: [id number] Patient Account Number: [id number] Date of Birth/Sex: Treating RN: [birth year] (75 y.o. Tanya Jensen, [name] Primary Care Mayci Haning: Tanya Jensen Other Clinician: Referring Hitomi Slape: Treating Fallynn Gravett/Extender: Tanya Jensen in Treatment: 0 Wound Status Wound Number: 3 Primary Pressure Ulcer Etiology: Wound Location: Right, Lateral Malleolus Wound Open Wounding Event: Pressure Injury Status: Date Acquired: 07/29/2020 Comorbid Cataracts, Glaucoma, Hypertension, Lupus Erythematosus, Weeks Of Treatment: 0 History: Osteoarthritis, Dementia Clustered Wound: No Photos Wound Measurements Length: (cm) 1.4 Width: (cm) 2 Depth: (cm) 0.1 Area: (cm) 2.199 Volume: (cm) 0.22 % Reduction in Area: 0% % Reduction in Volume: 0% Epithelialization: None Tunneling: No Undermining: No Wound Description Classification: Unstageable/Unclassified Wound Margin: Distinct, outline attached Exudate Amount: Medium Exudate Type: Serosanguineous Exudate Color: red, brown Foul Odor After Cleansing: No Slough/Fibrino Yes Wound Bed Granulation Amount: None Present (0%) Exposed Structure Necrotic Amount: Large (67-100%) Fascia Exposed: No Necrotic  Quality: Eschar, Adherent Slough Fat Layer (Subcutaneous Tissue) Exposed: Yes Tendon Exposed: No Muscle Exposed: No Joint Exposed: No Bone Exposed: No Treatment Notes Wound #3 (Malleolus) Wound Laterality: Right, Lateral Cleanser Wound Cleanser Discharge Instruction: Cleanse the wound with wound cleanser prior to applying a clean dressing using gauze sponges, not tissue or cotton balls. Peri-Wound Care Zinc Oxide Ointment 30g tube Discharge Instruction: Apply  Zinc Oxide to periwound with each dressing change Topical Primary Dressing KerraCel Ag Gelling Fiber Dressing, 4x5 in (silver alginate) Discharge Instruction: Apply silver alginate to wound bed as instructed Secondary Dressing Woven Gauze Sponge, Non-Sterile 4x4 in Discharge Instruction: Apply over primary dressing as directed. ABD Pad, 5x9 Discharge Instruction: Apply over primary dressing as directed. Secured With [employer], 4.5x3.1 (in/yd) Discharge Instruction: Secure with Kerlix as directed. 21M Medipore H Soft Cloth Surgical T 4 x 2 (in/yd) ape Discharge Instruction: Secure dressing with tape as directed. Compression Wrap Compression Stockings Add-Ons Electronic Signature(s) Signed: 11/21/2020 9:05:33 PM By: Deon Pilling Signed: 11/21/2020 9:05:33 PM By: Deon Pilling Previous Signature: 10/30/2020 10:48:43 AM Version By: Sandre Kitty Previous Signature: 11/18/2020 8:58:48 PM Version By: Deon Pilling Entered By: Deon Pilling on 11/21/2020 21:03:26 -------------------------------------------------------------------------------- Wound Assessment Details Patient Name: Date of Service: Tanya Jensen, Tanya NCY L. 10/29/2020 10:30 A M Medical Record Number: [id number] Patient Account Number: [id number] Date of Birth/Sex: Treating RN: [birth year] (75 y.o. Tanya Jensen, [name] Primary Care Arcenio Mullaly: Tanya Jensen Other Clinician: Referring Renly Roots: Treating Kalene Cutler/Extender: Tanya Jensen  in Treatment: 0 Wound Status Wound Number: 4 Primary Pressure Ulcer Etiology: Wound Location: Right Calcaneus Wound Open Wounding Event: Pressure Injury Status: Date Acquired: 08/29/2020 Comorbid Cataracts, Glaucoma, Hypertension, Lupus Erythematosus, Weeks Of Treatment: 0 History: Osteoarthritis, Dementia Clustered Wound: No Photos Wound Measurements Length: (cm) 4 Width: (cm) 2 Depth: (cm) 0.9 Area: (cm) 6.283 Volume: (cm) 5.655 % Reduction in Area: 0% % Reduction in Volume: 0% Epithelialization: None Tunneling: No Undermining: No Wound Description Classification: Unstageable/Unclassified Wound Margin: Fibrotic scar, thickened scar Exudate Amount: Medium Exudate Type: Serosanguineous Exudate Color: red, brown Foul Odor After Cleansing: No Slough/Fibrino Yes Wound Bed Granulation Amount: Small (1-33%) Exposed Structure Granulation Quality: Red Fascia Exposed: No Necrotic Amount: Large (67-100%) Fat Layer (Subcutaneous Tissue) Exposed: Yes Necrotic Quality: Eschar, Adherent Slough Tendon Exposed: No Muscle Exposed: No Joint Exposed: No Bone Exposed: No Treatment Notes Wound #4 (Calcaneus) Wound Laterality: Right Cleanser Wound Cleanser Discharge Instruction: Cleanse the wound with wound cleanser prior to applying a clean dressing using gauze sponges, not tissue or cotton balls. Peri-Wound Care Zinc Oxide Ointment 30g tube Discharge Instruction: Apply Zinc Oxide to periwound with each dressing change Topical Primary Dressing KerraCel Ag Gelling Fiber Dressing, 4x5 in (silver alginate) Discharge Instruction: Apply silver alginate to wound bed as instructed Secondary Dressing Woven Gauze Sponge, Non-Sterile 4x4 in Discharge Instruction: Apply over primary dressing as directed. ABD Pad, 5x9 Discharge Instruction: Apply over primary dressing as directed. Secured With [employer], 4.5x3.1 (in/yd) Discharge Instruction: Secure with Kerlix as  directed. 21M Medipore H Soft Cloth Surgical T 4 x 2 (in/yd) ape Discharge Instruction: Secure dressing with tape as directed. Compression Wrap Compression Stockings Add-Ons Electronic Signature(s) Signed: 11/21/2020 9:05:33 PM By: Deon Pilling Previous Signature: 10/30/2020 10:48:43 AM Version By: Sandre Kitty Previous Signature: 11/18/2020 8:58:48 PM Version By: Deon Pilling Entered By: Deon Pilling on 11/21/2020 21:03:41 -------------------------------------------------------------------------------- Wound Assessment Details Patient Name: Date of Service: Tanya Jensen, Tanya NCY L. 10/29/2020 10:30 A M Medical Record Number: [id number] Patient Account Number: [id number] Date of Birth/Sex: Treating RN: [birth year] (75 y.o. Tanya Jensen Primary Care Lyriq Finerty: Tanya Jensen Other Clinician: Referring Marcus Schwandt: Treating Millicent Blazejewski/Extender: Tanya Jensen in Treatment: 0 Wound Status Wound Number: 5 Primary Pressure Ulcer Etiology: Wound Location: Left Calcaneus Wound Open Wounding Event: Pressure Injury Status: Date Acquired: 07/29/2020 Comorbid Cataracts, Glaucoma, Hypertension, Lupus Erythematosus, Weeks Of Treatment: 0 History: Osteoarthritis,  Dementia Clustered Wound: No Photos Wound Measurements Length: (cm) 5 Width: (cm) 7.5 Depth: (cm) 0.1 Area: (cm) 29.452 Volume: (cm) 2.945 % Reduction in Area: 0% % Reduction in Volume: 0% Epithelialization: None Tunneling: No Undermining: No Wound Description Classification: Category/Stage II Wound Margin: Indistinct, nonvisible Exudate Amount: None Present Foul Odor After Cleansing: No Slough/Fibrino No Wound Bed Granulation Amount: None Present (0%) Exposed Structure Necrotic Amount: None Present (0%) Fascia Exposed: No Fat Layer (Subcutaneous Tissue) Exposed: No Tendon Exposed: No Muscle Exposed: No Joint Exposed: No Bone Exposed: No Assessment Notes skin intact - blistered -  boggy Treatment Notes Wound #5 (Calcaneus) Wound Laterality: Left Cleanser Wound Cleanser Discharge Instruction: Cleanse the wound with wound cleanser prior to applying a clean dressing using gauze sponges, not tissue or cotton balls. Peri-Wound Care Zinc Oxide Ointment 30g tube Discharge Instruction: Apply Zinc Oxide to periwound with each dressing change Topical Primary Dressing KerraCel Ag Gelling Fiber Dressing, 4x5 in (silver alginate) Discharge Instruction: Apply silver alginate to wound bed as instructed Secondary Dressing Woven Gauze Sponge, Non-Sterile 4x4 in Discharge Instruction: Apply over primary dressing as directed. ABD Pad, 5x9 Discharge Instruction: Apply over primary dressing as directed. Secured With [employer], 4.5x3.1 (in/yd) Discharge Instruction: Secure with Kerlix as directed. 59M Medipore H Soft Cloth Surgical T 4 x 2 (in/yd) ape Discharge Instruction: Secure dressing with tape as directed. Compression Wrap Compression Stockings Add-Ons Electronic Signature(s) Signed: 11/21/2020 9:05:33 PM By: Deon Pilling Previous Signature: 10/30/2020 10:48:43 AM Version By: Sandre Kitty Previous Signature: 11/18/2020 8:58:48 PM Version By: Deon Pilling Entered By: Deon Pilling on 11/21/2020 21:03:55 -------------------------------------------------------------------------------- Wound Assessment Details Patient Name: Date of Service: Tanya Jensen, Tanya NCY L. 10/29/2020 10:30 A M Medical Record Number: [id number] Patient Account Number: [id number] Date of Birth/Sex: Treating RN: [birth year] (75 y.o. Tanya Jensen, [name] Primary Care Mylin Gignac: Tanya Jensen Other Clinician: Referring Minas Bonser: Treating Kimblery Diop/Extender: Tanya Jensen in Treatment: 0 Wound Status Wound Number: 6 Primary Pressure Ulcer Etiology: Wound Location: Left, Medial Foot Wound Open Wounding Event: Pressure Injury Status: Date Acquired: 07/29/2020 Comorbid  Cataracts, Glaucoma, Hypertension, Lupus Erythematosus, Weeks Of Treatment: 0 History: Osteoarthritis, Dementia Clustered Wound: No Photos Wound Measurements Length: (cm) 0.7 Width: (cm) 0.7 Depth: (cm) 0.1 Area: (cm) 0.385 Volume: (cm) 0.038 % Reduction in Area: 0% % Reduction in Volume: 0% Epithelialization: None Wound Description Classification: Unstageable/Unclassified Wound Margin: Flat and Intact Exudate Amount: Medium Exudate Type: Serosanguineous Exudate Color: red, brown Foul Odor After Cleansing: No Slough/Fibrino Yes Wound Bed Granulation Amount: Small (1-33%) Exposed Structure Necrotic Amount: Large (67-100%) Fascia Exposed: No Necrotic Quality: Eschar Fat Layer (Subcutaneous Tissue) Exposed: Yes Tendon Exposed: No Muscle Exposed: No Joint Exposed: No Bone Exposed: No Treatment Notes Wound #6 (Foot) Wound Laterality: Left, Medial Cleanser Wound Cleanser Discharge Instruction: Cleanse the wound with wound cleanser prior to applying a clean dressing using gauze sponges, not tissue or cotton balls. Peri-Wound Care Zinc Oxide Ointment 30g tube Discharge Instruction: Apply Zinc Oxide to periwound with each dressing change Topical Primary Dressing KerraCel Ag Gelling Fiber Dressing, 4x5 in (silver alginate) Discharge Instruction: Apply silver alginate to wound bed as instructed Secondary Dressing Woven Gauze Sponge, Non-Sterile 4x4 in Discharge Instruction: Apply over primary dressing as directed. ABD Pad, 5x9 Discharge Instruction: Apply over primary dressing as directed. Secured With [employer], 4.5x3.1 (in/yd) Discharge Instruction: Secure with Kerlix as directed. 59M Medipore H Soft Cloth Surgical T 4 x 2 (in/yd) ape Discharge Instruction: Secure dressing with tape as directed. Compression  Wrap Compression Stockings Add-Ons Electronic Signature(s) Signed: 11/21/2020 9:05:33 PM By: Deon Pilling Previous Signature: 10/30/2020 10:48:43 AM  Version By: Sandre Kitty Previous Signature: 11/18/2020 8:58:48 PM Version By: Deon Pilling Entered By: Deon Pilling on 11/21/2020 21:04:23 -------------------------------------------------------------------------------- Wound Assessment Details Patient Name: Date of Service: Tanya Jensen, Tanya NCY L. 10/29/2020 10:30 A M Medical Record Number: [id number] Patient Account Number: [id number] Date of Birth/Sex: Treating RN: [birth year] Jensen (75 y.o. Tanya Jensen, [name] Primary Care Binnie Droessler: Tanya Jensen Other Clinician: Referring Belanna Manring: Treating Warden Buffa/Extender: Tanya Jensen in Treatment: 0 Wound Status Wound Number: 7 Primary Pressure Ulcer Etiology: Wound Location: Left, Lateral Malleolus Wound Open Wounding Event: Pressure Injury Status: Date Acquired: 07/29/2020 Comorbid Cataracts, Glaucoma, Hypertension, Lupus Erythematosus, Weeks Of Treatment: 0 History: Osteoarthritis, Dementia Clustered Wound: No Photos Wound Measurements Length: (cm) 7.1 Width: (cm) 2.1 Depth: (cm) 0.2 Area: (cm) 11.71 Volume: (cm) 2.342 % Reduction in Area: 0% % Reduction in Volume: 0% Epithelialization: None Tunneling: No Undermining: No Wound Description Classification: Unstageable/Unclassified Wound Margin: Flat and Intact Exudate Amount: Medium Exudate Type: Serosanguineous Exudate Color: red, brown Foul Odor After Cleansing: No Slough/Fibrino Yes Wound Bed Granulation Amount: Small (1-33%) Exposed Structure Granulation Quality: Red Fascia Exposed: No Necrotic Amount: Large (67-100%) Fat Layer (Subcutaneous Tissue) Exposed: Yes Necrotic Quality: Eschar Tendon Exposed: No Muscle Exposed: No Joint Exposed: No Bone Exposed: No Treatment Notes Wound #7 (Malleolus) Wound Laterality: Left, Lateral Cleanser Wound Cleanser Discharge Instruction: Cleanse the wound with wound cleanser prior to applying a clean dressing using gauze sponges, not tissue or cotton  balls. Peri-Wound Care Zinc Oxide Ointment 30g tube Discharge Instruction: Apply Zinc Oxide to periwound with each dressing change Topical Primary Dressing KerraCel Ag Gelling Fiber Dressing, 4x5 in (silver alginate) Discharge Instruction: Apply silver alginate to wound bed as instructed Secondary Dressing Woven Gauze Sponge, Non-Sterile 4x4 in Discharge Instruction: Apply over primary dressing as directed. ABD Pad, 5x9 Discharge Instruction: Apply over primary dressing as directed. Secured With [employer], 4.5x3.1 (in/yd) Discharge Instruction: Secure with Kerlix as directed. 25M Medipore H Soft Cloth Surgical T 4 x 2 (in/yd) ape Discharge Instruction: Secure dressing with tape as directed. Compression Wrap Compression Stockings Add-Ons Electronic Signature(s) Signed: 11/21/2020 9:05:33 PM By: Deon Pilling Previous Signature: 10/30/2020 10:48:43 AM Version By: Sandre Kitty Previous Signature: 11/18/2020 8:58:48 PM Version By: Deon Pilling Entered By: Deon Pilling on 11/21/2020 21:04:41 -------------------------------------------------------------------------------- Wound Assessment Details Patient Name: Date of Service: Tanya Jensen, Tanya NCY L. 10/29/2020 10:30 A M Medical Record Number: [id number] Patient Account Number: [id number] Date of Birth/Sex: Treating RN: [birth year] (75 y.o. Tanya Jensen, [name] Primary Care Brynlee Pennywell: Tanya Jensen Other Clinician: Referring Jashawn Floyd: Treating Katielynn Horan/Extender: Tanya Jensen in Treatment: 0 Wound Status Wound Number: 8 Primary Pressure Ulcer Etiology: Wound Location: Left T Second oe Wound Open Wounding Event: Pressure Injury Status: Date Acquired: 07/29/2020 Comorbid Cataracts, Glaucoma, Hypertension, Lupus Erythematosus, Weeks Of Treatment: 0 History: Osteoarthritis, Dementia Clustered Wound: No Photos Wound Measurements Length: (cm) 0.5 Width: (cm) 0.9 Depth: (cm) 0.1 Area: (cm)  0.353 Volume: (cm) 0.035 % Reduction in Area: 0% % Reduction in Volume: 0% Epithelialization: None Tunneling: No Undermining: No Wound Description Classification: Unstageable/Unclassified Wound Margin: Flat and Intact Exudate Amount: Medium Exudate Type: Serosanguineous Exudate Color: red, brown Foul Odor After Cleansing: No Slough/Fibrino Yes Wound Bed Granulation Amount: None Present (0%) Exposed Structure Necrotic Amount: Large (67-100%) Fascia Exposed: No Necrotic Quality: Adherent Slough Fat Layer (Subcutaneous Tissue) Exposed: Yes Tendon Exposed: No Muscle Exposed: No Joint  Exposed: No Bone Exposed: No Treatment Notes Wound #8 (Toe Second) Wound Laterality: Left Cleanser Wound Cleanser Discharge Instruction: Cleanse the wound with wound cleanser prior to applying a clean dressing using gauze sponges, not tissue or cotton balls. Peri-Wound Care Zinc Oxide Ointment 30g tube Discharge Instruction: Apply Zinc Oxide to periwound with each dressing change Topical Primary Dressing KerraCel Ag Gelling Fiber Dressing, 4x5 in (silver alginate) Discharge Instruction: Apply silver alginate to wound bed as instructed Secondary Dressing Woven Gauze Sponge, Non-Sterile 4x4 in Discharge Instruction: Apply over primary dressing as directed. ABD Pad, 5x9 Discharge Instruction: Apply over primary dressing as directed. Secured With [employer], 4.5x3.1 (in/yd) Discharge Instruction: Secure with Kerlix as directed. 61M Medipore H Soft Cloth Surgical T 4 x 2 (in/yd) ape Discharge Instruction: Secure dressing with tape as directed. Compression Wrap Compression Stockings Add-Ons Electronic Signature(s) Signed: 11/21/2020 9:05:33 PM By: Deon Pilling Previous Signature: 10/30/2020 10:48:43 AM Version By: Sandre Kitty Previous Signature: 11/18/2020 8:58:48 PM Version By: Deon Pilling Entered By: Deon Pilling on 11/21/2020  21:04:54 -------------------------------------------------------------------------------- Wound Assessment Details Patient Name: Date of Service: Tanya Jensen, Tanya NCY L. 10/29/2020 10:30 A M Medical Record Number: [id number] Patient Account Number: [id number] Date of Birth/Sex: Treating RN: [birth year] (75 y.o. Tanya Jensen, Tanya Jensen Primary Care Jarrod Bodkins: Tanya Jensen Other Clinician: Referring Chaeli Judy: Treating Nash Bolls/Extender: Tanya Jensen in Treatment: 0 Wound Status Wound Number: 9 Primary Pressure Ulcer Etiology: Wound Location: Right, Lateral T Great oe Wound Open Wounding Event: Pressure Injury Status: Date Acquired: 07/29/2020 Comorbid Cataracts, Glaucoma, Hypertension, Lupus Erythematosus, Weeks Of Treatment: 0 History: Osteoarthritis, Dementia Clustered Wound: No Photos Wound Measurements Length: (cm) 0.7 Width: (cm) 0.9 Depth: (cm) 0.2 Area: (cm) 0.495 Volume: (cm) 0.099 % Reduction in Area: 0% % Reduction in Volume: 0% Epithelialization: None Tunneling: No Undermining: No Wound Description Classification: Unstageable/Unclassified Wound Margin: Flat and Intact Exudate Amount: Medium Exudate Type: Serosanguineous Exudate Color: red, brown Foul Odor After Cleansing: No Slough/Fibrino Yes Wound Bed Granulation Amount: None Present (0%) Exposed Structure Necrotic Amount: Large (67-100%) Fascia Exposed: No Fat Layer (Subcutaneous Tissue) Exposed: Yes Tendon Exposed: No Muscle Exposed: No Joint Exposed: No Bone Exposed: No Electronic Signature(s) Signed: 10/30/2020 10:48:43 AM By: Sandre Kitty Signed: 10/30/2020 6:02:58 PM By: Rhae Hammock RN Entered By: Sandre Kitty on 10/30/2020 10:41:16 -------------------------------------------------------------------------------- Vitals Details Patient Name: Date of Service: Tanya Jensen, Tanya NCY L. 10/29/2020 10:30 A M Medical Record Number: [id number] Patient Account Number:  [id number] Date of Birth/Sex: Treating RN: Tanya 13, Jensen (75 y.o. Tanya Jensen Primary Care Virgia Kelner: Tanya Jensen Other Clinician: Referring Valena Ivanov: Treating Greggory Safranek/Extender: Tanya Jensen in Treatment: 0 Vital Signs Time Taken: 11:05 Reference Range: 80 - 120 mg / dl Height (in): 67 Source: Stated Source: Stated Notes unable to get vital signs due to pt agitation . weight is not known Engineer, maintenance) Signed: 11/21/2020 9:05:33 PM By: Deon Pilling Previous Signature: 11/18/2020 8:58:48 PM Version By: Deon Pilling Entered By: Deon Pilling on 11/21/2020 21:01:32

## 2020-11-25 NOTE — Progress Notes (Signed)
[name], [name] ([id number]) Visit Report for 10/29/2020 Chief Complaint Document Details Patient Name: Date of Service: Tanya Jensen 10/29/2020 10:30 A M Medical Record Number: [id number] Patient Account Number: [id number] Date of Birth/Sex: Treating RN: March 07, 1946 (75 y.o. Tanya Jensen, Tanya Jensen Primary Care Provider: Shirline Frees Other Clinician: Referring Provider: Treating Provider/Extender: Ponciano Ort in Treatment: 0 Information Obtained from: Patient Chief Complaint 10/29/2020; patient is here for review of multiple pressure ulcers Electronic Signature(s) Signed: 10/29/2020 5:29:17 PM By: Linton Ham MD Entered By: Linton Ham on 10/29/2020 12:48:27 -------------------------------------------------------------------------------- Debridement Details Patient Name: Date of Service: Tanya Jensen, NA NCY L. 10/29/2020 10:30 A M Medical Record Number: [id number] Patient Account Number: [id number] Date of Birth/Sex: Treating RN: 15-Mar-1946 (75 y.o. Tanya Jensen, Tanya Jensen Primary Care Provider: Shirline Frees Other Clinician: Referring Provider: Treating Provider/Extender: Ponciano Ort in Treatment: 0 Debridement Performed for Assessment: Wound #1 Sacrum Performed By: Physician Ricard Dillon., MD Debridement Type: Debridement Level of Consciousness (Pre-procedure): Awake and Alert Pre-procedure Verification/Time Out Yes - 12:17 Taken: Start Time: 12:17 Pain Control: Lidocaine T Area Debrided (L x W): otal 10.8 (cm) x 15.5 (cm) = 167.4 (cm) Tissue and other material debrided: Viable, Non-Viable, Eschar, Slough, Subcutaneous, Skin: Dermis , Skin: Epidermis, Slough Level: Skin/Subcutaneous Tissue Debridement Description: Excisional Instrument: Blade, Curette, Forceps Bleeding: Minimum Hemostasis Achieved: Pressure End Time: 12:25 Procedural Pain: 0 Post Procedural Pain: 0 Response to Treatment: Procedure was  tolerated well Level of Consciousness (Post- Awake and Alert procedure): Post Debridement Measurements of Total Wound Length: (cm) 10.8 Stage: Unstageable/Unclassified Width: (cm) 15.5 Depth: (cm) 0.9 Volume: (cm) [id number] Character of Wound/Ulcer Post Debridement: Improved Post Procedure Diagnosis Same as Pre-procedure Electronic Signature(s) Signed: 10/29/2020 5:29:17 PM By: Linton Ham MD Signed: 10/29/2020 5:48:53 PM By: Rhae Hammock RN Entered By: Linton Ham on 10/29/2020 12:47:50 -------------------------------------------------------------------------------- Debridement Details Patient Name: Date of Service: Tanya Jensen, NA NCY L. 10/29/2020 10:30 A M Medical Record Number: [id number] Patient Account Number: [id number] Date of Birth/Sex: Treating RN: 05/16/1945 (75 y.o. Tanya Jensen, Tanya Jensen Primary Care Provider: Shirline Frees Other Clinician: Referring Provider: Treating Provider/Extender: Ponciano Ort in Treatment: 0 Debridement Performed for Assessment: Wound #4 Right Calcaneus Performed By: Physician Ricard Dillon., MD Debridement Type: Debridement Level of Consciousness (Pre-procedure): Awake and Alert Pre-procedure Verification/Time Out Yes - 12:25 Taken: Start Time: 12:25 Pain Control: Lidocaine T Area Debrided (L x W): otal 4 (cm) x 2 (cm) = 8 (cm) Tissue and other material debrided: Viable, Non-Viable, Eschar, Slough, Subcutaneous, Skin: Dermis , Skin: Epidermis, Slough Level: Skin/Subcutaneous Tissue Debridement Description: Excisional Instrument: Blade, Curette, Forceps Bleeding: Minimum Hemostasis Achieved: Pressure End Time: 12:25 Procedural Pain: 0 Post Procedural Pain: 0 Response to Treatment: Procedure was tolerated well Level of Consciousness (Post- Awake and Alert procedure): Post Debridement Measurements of Total Wound Length: (cm) 4 Stage: Unstageable/Unclassified Width: (cm) 2 Depth: (cm)  0.9 Volume: (cm) 5.655 Character of Wound/Ulcer Post Debridement: Improved Post Procedure Diagnosis Same as Pre-procedure Electronic Signature(s) Signed: 10/29/2020 5:29:17 PM By: Linton Ham MD Signed: 10/29/2020 5:48:53 PM By: Rhae Hammock RN Entered By: Linton Ham on 10/29/2020 12:48:00 -------------------------------------------------------------------------------- HPI Details Patient Name: Date of Service: Tanya Jensen, NA NCY L. 10/29/2020 10:30 A M Medical Record Number: [id number] Patient Account Number: [id number] Date of Birth/Sex: Treating RN: 10-18-1945 (75 y.o. Tanya Jensen, Tanya Jensen Primary Care Provider: Shirline Frees Other Clinician: Referring Provider: Treating Provider/Extender: Ponciano Ort in Treatment: 0 History of Present Illness HPI  Description: ADMISSION 10/29/2020 This is a 75 year old woman who is very disabled secondary to advanced Lewy body dementia. Essentially bed and chair bound. She has 100% dependent on her husband for care and he accompanied her today. She also has advanced home care. She was she is here with multiple pressure areas some of them really serious; Major area on the left buttock and sacrum completely covered in necrotic surface Deep probing area over the left greater trochanter/hip. This goes well into the muscle layer although I did not feel bone. Right heel 2 small areas in close juxtaposition both of them necrotic although they do not connect Left heel blister/boggy Left medial foot blackened eschar over the fifth metatarsal base Left lateral malleolus also necrotic surface. Right first toe 2 small areas in the left second toe also an open area. According to her husband they are using Medihoney although I am not exactly sure which product. They have ordered some form of pressure relief surface for her bed although I do not think they have it yet. They have also ordered a cushion for her wheelchair. She  apparently eats well per the husband and there has not been any recent weight loss although she did have some weight loss he thinks it stabilized. She is on Ensure supplements. Past medical history includes Lewy body dementia, open angle glaucoma, lumbar spondylosis Electronic Signature(s) Signed: 10/29/2020 5:29:17 PM By: Linton Ham MD Entered By: Linton Ham on 10/29/2020 12:51:32 -------------------------------------------------------------------------------- Physical Exam Details Patient Name: Date of Service: Tanya Jensen, NA NCY L. 10/29/2020 10:30 A M Medical Record Number: [id number] Patient Account Number: [id number] Date of Birth/Sex: Treating RN: 02/17/1946 (75 y.o. Tanya Jensen, Tanya Jensen Primary Care Provider: Shirline Frees Other Clinician: Referring Provider: Treating Provider/Extender: Ponciano Ort in Treatment: 0 Constitutional Advanced dementia. Patient lies in bed singing. Respiratory work of breathing is normal. Cardiovascular Both dorsalis pedis is are palpable. Psychiatric Advanced dementia. Notes Wound exam; the patient's major wounds are on the left buttock in close proximity and encompassing the sacrum. Completely necrotic surface. I used pickups and a #10 scalpel to remove the tissue necrosis and then a #5 curette to attempt to clean off the surface here. I removed fat and subcutaneous tissue. I do not know that this goes to bone clearly in the muscle layer of her gluteal musculature. Second major areas on the left greater trochanter. This has a depth of 4 cm again purulent drainage. The right heel had 2 necrotic areas in close juxtaposition both of them debrided down to a better surface. Patient had areas on the right lateral malleolus, left medial foot and left lateral malleolus all of which were covered in black eschar today I did not debride these Areas on the right first and left second toe were very difficult to see Electronic  Signature(s) Signed: 10/29/2020 5:29:17 PM By: Linton Ham MD Entered By: Linton Ham on 10/29/2020 12:54:25 -------------------------------------------------------------------------------- Physician Orders Details Patient Name: Date of Service: Tanya Jensen, NA NCY L. 10/29/2020 10:30 A M Medical Record Number: [id number] Patient Account Number: [id number] Date of Birth/Sex: Treating RN: [birth year] (75 y.o. Tanya Jensen, Tanya Jensen Primary Care Provider: Shirline Frees Other Clinician: Referring Provider: Treating Provider/Extender: Ponciano Ort in Treatment: 0 Verbal / Phone Orders: No Diagnosis Coding Follow-up Appointments ppointment in 1 week. - Dr. Dellia Nims Return A ****EXTRA TIME FOR HIGH ACUITY-10 WOUNDS**** Bathing/ Shower/ Hygiene May shower with protection but do not get wound dressing(s) wet. Off-Loading Turn and reposition every 2 hours  Other: - Float heels!!!! [facility] wound care orders this week; continue Home Health for wound care. May utilize formulary equivalent dressing for wound treatment orders unless otherwise specified. - [facility] to change MOnday's, Wednesday's, Friday's Wound Treatment Wound #1 - Sacrum Cleanser: Wound Cleanser [facility]) Every Other Day/15 Days Discharge Instructions: Cleanse the wound with wound cleanser prior to applying a clean dressing using gauze sponges, not tissue or cotton balls. Peri-Wound Care: Zinc Oxide Ointment 30g tube [facility]) Every Other Day/15 Days Discharge Instructions: Apply Zinc Oxide to periwound with each dressing change Prim Dressing: KerraCel Ag Gelling Fiber Dressing, 4x5 in (silver alginate) (Home Health) Every Other Day/15 Days ary Discharge Instructions: Apply silver alginate to wound bed as instructed Secondary Dressing: Woven Gauze Sponge, Non-Sterile 4x4 in [facility]) Every Other Day/15 Days Discharge Instructions: Apply over primary dressing as directed. Secondary  Dressing: ABD Pad, 5x9 [facility]) Every Other Day/15 Days Discharge Instructions: Apply over primary dressing as directed. Secured With: 49M Medipore H Soft Cloth Surgical T 4 x 2 (in/yd) (Home Health) Every Other Day/15 Days ape Discharge Instructions: Secure dressing with tape as directed. Wound #10 - T Great oe Wound Laterality: Right, Distal Cleanser: Wound Cleanser (Home Health) Every Other Day/15 Days Discharge Instructions: Cleanse the wound with wound cleanser prior to applying a clean dressing using gauze sponges, not tissue or cotton balls. Peri-Wound Care: Zinc Oxide Ointment 30g tube [facility]) Every Other Day/15 Days Discharge Instructions: Apply Zinc Oxide to periwound with each dressing change Prim Dressing: KerraCel Ag Gelling Fiber Dressing, 4x5 in (silver alginate) (Home Health) Every Other Day/15 Days ary Discharge Instructions: Apply silver alginate to wound bed as instructed Secondary Dressing: Woven Gauze Sponge, Non-Sterile 4x4 in [facility]) Every Other Day/15 Days Discharge Instructions: Apply over primary dressing as directed. Secondary Dressing: ABD Pad, 5x9 [facility]) Every Other Day/15 Days Discharge Instructions: Apply over primary dressing as directed. Secured With: [employer], 4.5x3.1 (in/yd) [facility]) Every Other Day/15 Days Discharge Instructions: Secure with Kerlix as directed. Secured With: 49M Medipore H Soft Cloth Surgical T 4 x 2 (in/yd) (Home Health) Every Other Day/15 Days ape Discharge Instructions: Secure dressing with tape as directed. Wound #2 - Trochanter Wound Laterality: Left Cleanser: Wound Cleanser (Home Health) Every Other Day/15 Days Discharge Instructions: Cleanse the wound with wound cleanser prior to applying a clean dressing using gauze sponges, not tissue or cotton balls. Peri-Wound Care: Zinc Oxide Ointment 30g tube [facility]) Every Other Day/15 Days Discharge Instructions: Apply Zinc Oxide to periwound with  each dressing change Prim Dressing: KerraCel Ag Gelling Fiber Dressing, 4x5 in (silver alginate) (Home Health) Every Other Day/15 Days ary Discharge Instructions: Apply silver alginate to wound bed as instructed Secondary Dressing: Woven Gauze Sponge, Non-Sterile 4x4 in [facility]) Every Other Day/15 Days Discharge Instructions: Apply over primary dressing as directed. Secondary Dressing: ABD Pad, 5x9 [facility]) Every Other Day/15 Days Discharge Instructions: Apply over primary dressing as directed. Secured With: 49M Medipore H Soft Cloth Surgical T 4 x 2 (in/yd) (Home Health) Every Other Day/15 Days ape Discharge Instructions: Secure dressing with tape as directed. Wound #3 - Malleolus Wound Laterality: Right, Lateral Cleanser: Wound Cleanser (Home Health) Every Other Day/15 Days Discharge Instructions: Cleanse the wound with wound cleanser prior to applying a clean dressing using gauze sponges, not tissue or cotton balls. Peri-Wound Care: Zinc Oxide Ointment 30g tube [facility]) Every Other Day/15 Days Discharge Instructions: Apply Zinc Oxide to periwound with each dressing  change Prim Dressing: KerraCel Ag Gelling Fiber Dressing, 4x5 in (silver alginate) (Home Health) Every Other Day/15 Days ary Discharge Instructions: Apply silver alginate to wound bed as instructed Secondary Dressing: Woven Gauze Sponge, Non-Sterile 4x4 in [facility]) Every Other Day/15 Days Discharge Instructions: Apply over primary dressing as directed. Secondary Dressing: ABD Pad, 5x9 [facility]) Every Other Day/15 Days Discharge Instructions: Apply over primary dressing as directed. Secured With: [employer], 4.5x3.1 (in/yd) [facility]) Every Other Day/15 Days Discharge Instructions: Secure with Kerlix as directed. Secured With: 30M Medipore H Soft Cloth Surgical T 4 x 2 (in/yd) (Home Health) Every Other Day/15 Days ape Discharge Instructions: Secure dressing with tape as directed. Wound #4 -  Calcaneus Wound Laterality: Right Cleanser: Wound Cleanser (Home Health) Every Other Day/15 Days Discharge Instructions: Cleanse the wound with wound cleanser prior to applying a clean dressing using gauze sponges, not tissue or cotton balls. Peri-Wound Care: Zinc Oxide Ointment 30g tube [facility]) Every Other Day/15 Days Discharge Instructions: Apply Zinc Oxide to periwound with each dressing change Prim Dressing: KerraCel Ag Gelling Fiber Dressing, 4x5 in (silver alginate) (Home Health) Every Other Day/15 Days ary Discharge Instructions: Apply silver alginate to wound bed as instructed Secondary Dressing: Woven Gauze Sponge, Non-Sterile 4x4 in [facility]) Every Other Day/15 Days Discharge Instructions: Apply over primary dressing as directed. Secondary Dressing: ABD Pad, 5x9 [facility]) Every Other Day/15 Days Discharge Instructions: Apply over primary dressing as directed. Secured With: [employer], 4.5x3.1 (in/yd) [facility]) Every Other Day/15 Days Discharge Instructions: Secure with Kerlix as directed. Secured With: 30M Medipore H Soft Cloth Surgical T 4 x 2 (in/yd) (Home Health) Every Other Day/15 Days ape Discharge Instructions: Secure dressing with tape as directed. Wound #5 - Calcaneus Wound Laterality: Left Cleanser: Wound Cleanser (Home Health) Every Other Day/15 Days Discharge Instructions: Cleanse the wound with wound cleanser prior to applying a clean dressing using gauze sponges, not tissue or cotton balls. Peri-Wound Care: Zinc Oxide Ointment 30g tube [facility]) Every Other Day/15 Days Discharge Instructions: Apply Zinc Oxide to periwound with each dressing change Prim Dressing: KerraCel Ag Gelling Fiber Dressing, 4x5 in (silver alginate) (Home Health) Every Other Day/15 Days ary Discharge Instructions: Apply silver alginate to wound bed as instructed Secondary Dressing: Woven Gauze Sponge, Non-Sterile 4x4 in [facility]) Every Other Day/15  Days Discharge Instructions: Apply over primary dressing as directed. Secondary Dressing: ABD Pad, 5x9 [facility]) Every Other Day/15 Days Discharge Instructions: Apply over primary dressing as directed. Secured With: [employer], 4.5x3.1 (in/yd) [facility]) Every Other Day/15 Days Discharge Instructions: Secure with Kerlix as directed. Secured With: 30M Medipore H Soft Cloth Surgical T 4 x 2 (in/yd) (Home Health) Every Other Day/15 Days ape Discharge Instructions: Secure dressing with tape as directed. Wound #6 - Foot Wound Laterality: Left, Medial Cleanser: Wound Cleanser (Home Health) Every Other Day/15 Days Discharge Instructions: Cleanse the wound with wound cleanser prior to applying a clean dressing using gauze sponges, not tissue or cotton balls. Peri-Wound Care: Zinc Oxide Ointment 30g tube [facility]) Every Other Day/15 Days Discharge Instructions: Apply Zinc Oxide to periwound with each dressing change Prim Dressing: KerraCel Ag Gelling Fiber Dressing, 4x5 in (silver alginate) (Home Health) Every Other Day/15 Days ary Discharge Instructions: Apply silver alginate to wound bed as instructed Secondary Dressing: Woven Gauze Sponge, Non-Sterile 4x4 in [facility]) Every Other Day/15 Days Discharge Instructions: Apply over primary dressing as directed. Secondary Dressing: ABD Pad, 5x9 [facility]) Every Other  Day/15 Days Discharge Instructions: Apply over primary dressing as directed. Secured With: [employer], 4.5x3.1 (in/yd) [facility]) Every Other Day/15 Days Discharge Instructions: Secure with Kerlix as directed. Secured With: 66M Medipore H Soft Cloth Surgical T 4 x 2 (in/yd) (Home Health) Every Other Day/15 Days ape Discharge Instructions: Secure dressing with tape as directed. Wound #7 - Malleolus Wound Laterality: Left, Lateral Cleanser: Wound Cleanser (Home Health) Every Other Day/15 Days Discharge Instructions: Cleanse the wound with wound cleanser  prior to applying a clean dressing using gauze sponges, not tissue or cotton balls. Peri-Wound Care: Zinc Oxide Ointment 30g tube [facility]) Every Other Day/15 Days Discharge Instructions: Apply Zinc Oxide to periwound with each dressing change Prim Dressing: KerraCel Ag Gelling Fiber Dressing, 4x5 in (silver alginate) (Home Health) Every Other Day/15 Days ary Discharge Instructions: Apply silver alginate to wound bed as instructed Secondary Dressing: Woven Gauze Sponge, Non-Sterile 4x4 in [facility]) Every Other Day/15 Days Discharge Instructions: Apply over primary dressing as directed. Secondary Dressing: ABD Pad, 5x9 [facility]) Every Other Day/15 Days Discharge Instructions: Apply over primary dressing as directed. Secured With: [employer], 4.5x3.1 (in/yd) [facility]) Every Other Day/15 Days Discharge Instructions: Secure with Kerlix as directed. Secured With: 66M Medipore H Soft Cloth Surgical T 4 x 2 (in/yd) (Home Health) Every Other Day/15 Days ape Discharge Instructions: Secure dressing with tape as directed. Wound #8 - T Second oe Wound Laterality: Left Cleanser: Wound Cleanser (Home Health) Every Other Day/15 Days Discharge Instructions: Cleanse the wound with wound cleanser prior to applying a clean dressing using gauze sponges, not tissue or cotton balls. Peri-Wound Care: Zinc Oxide Ointment 30g tube [facility]) Every Other Day/15 Days Discharge Instructions: Apply Zinc Oxide to periwound with each dressing change Prim Dressing: KerraCel Ag Gelling Fiber Dressing, 4x5 in (silver alginate) (Home Health) Every Other Day/15 Days ary Discharge Instructions: Apply silver alginate to wound bed as instructed Secondary Dressing: Woven Gauze Sponge, Non-Sterile 4x4 in [facility]) Every Other Day/15 Days Discharge Instructions: Apply over primary dressing as directed. Secondary Dressing: ABD Pad, 5x9 [facility]) Every Other Day/15 Days Discharge Instructions:  Apply over primary dressing as directed. Secured With: [employer], 4.5x3.1 (in/yd) [facility]) Every Other Day/15 Days Discharge Instructions: Secure with Kerlix as directed. Secured With: 66M Medipore H Soft Cloth Surgical T 4 x 2 (in/yd) (Home Health) Every Other Day/15 Days ape Discharge Instructions: Secure dressing with tape as directed. Wound #9 - T Great oe Wound Laterality: Right, Lateral Cleanser: Wound Cleanser (Home Health) Every Other Day/15 Days Discharge Instructions: Cleanse the wound with wound cleanser prior to applying a clean dressing using gauze sponges, not tissue or cotton balls. Peri-Wound Care: Zinc Oxide Ointment 30g tube [facility]) Every Other Day/15 Days Discharge Instructions: Apply Zinc Oxide to periwound with each dressing change Prim Dressing: KerraCel Ag Gelling Fiber Dressing, 4x5 in (silver alginate) (Home Health) Every Other Day/15 Days ary Discharge Instructions: Apply silver alginate to wound bed as instructed Secondary Dressing: Woven Gauze Sponge, Non-Sterile 4x4 in [facility]) Every Other Day/15 Days Discharge Instructions: Apply over primary dressing as directed. Secondary Dressing: ABD Pad, 5x9 [facility]) Every Other Day/15 Days Discharge Instructions: Apply over primary dressing as directed. Secured With: [employer], 4.5x3.1 (in/yd) [facility]) Every Other Day/15 Days Discharge Instructions: Secure with Kerlix as directed. Secured With: 66M Medipore H Soft Cloth Surgical T 4 x 2 (in/yd) (Home Health) Every Other Day/15 Days ape Discharge Instructions: Secure dressing with  tape as directed. Laboratory naerobe culture (MICRO) - SAcrum and L trochanter Bacteria identified in Unspecified specimen by A LOINC Code: [id number] Convenience Name: Anerobic culture Radiology X-ray, coccyx Patient Medications llergies: No Known Drug Allergies A Notifications Medication Indication Start End wound infection  10/29/2020 Augmentin DOSE oral 250 mg/5 mL-62.5 mg/5 mL suspension for reconstitution - suspension for reconstitution oral '500mg'$  (24m) tid for 10 days Electronic Signature(s) Signed: 10/29/2020 5:29:17 PM By: RLinton HamMD Signed: 10/29/2020 5:48:53 PM By: BRhae HammockRN Previous Signature: 10/29/2020 12:58:08 PM Version By: RLinton HamMD Entered By: BRhae Hammockon 10/29/2020 17:13:25 Prescription 10/29/2020 -------------------------------------------------------------------------------- WDarien RamusMD Patient Name: Provider: 8Jul 07, [id number] of Birth: NPI#:[id number]: DEA #: [id number] #: License #: [place] Address[id number] [id number] [id number] No Known Drug Allergies Provider's Orders X-ray, coccyx Hand Signature: Date(s): Electronic Signature(s) Signed: 10/29/2020 5:29:17 PM By: RLinton HamMD Signed: 10/29/2020 5:48:53 PM By: BRhae HammockRN Entered By: BRhae Hammockon 10/29/2020 17:13:26 -------------------------------------------------------------------------------- Problem List Details Patient Name: Date of Service: WAris Jensen NA NCY L. 10/29/2020 10:30 A M Medical Record Number: [id number] Account Number: [id number] of Birth/Sex: Treating RN: [birth year](75y.o. FTonita Jensen Tanya Jensen Primary Care Provider: HShirline FreesOther Clinician: Referring Provider: Treating Provider/Extender: RPonciano Ortin Treatment: 0 Active Problems ICD-10 Encounter Code Description Active Date MDM Diagnosis G31.83 Dementia with Lewy bodies 10/29/2020 No Yes L89.224 Pressure ulcer of left hip, stage 4 10/29/2020 No Yes L89.150 Pressure ulcer of sacral region, unstageable 10/29/2020 No Yes L89.510 Pressure ulcer of right  ankle, unstageable 10/29/2020 No Yes L89.614 Pressure ulcer of right heel, stage 4 10/29/2020 No Yes L97.528 Non-pressure chronic ulcer of other part of left foot with other specified 10/29/2020 No Yes severity L89.520 Pressure ulcer of left ankle, unstageable 10/29/2020 No Yes L97.518 Non-pressure chronic ulcer of other part of right foot with other specified 10/29/2020 No Yes severity L97.528 Non-pressure chronic ulcer of other part of left foot with other specified 10/29/2020 No Yes severity Inactive Problems Resolved Problems Electronic Signature(s) Signed: 10/29/2020 5:29:17 PM By: RLinton HamMD Entered By: RLinton Hamon 10/29/2020 13:02:37 -------------------------------------------------------------------------------- Progress Note Details Patient Name: Date of Service: WAris Jensen NA NCY L. 10/29/2020 10:30 A M Medical Record Number: [id number] Account Number: [id number] of Birth/Sex: Treating RN: [id number](75y.o. FTonita Jensen Tanya Jensen Primary Care Provider: HShirline FreesOther Clinician: Referring Provider: Treating Provider/Extender: RPonciano Ortin Treatment: 0 Subjective Chief Complaint Information obtained from Patient 10/29/2020; patient is here for review of multiple pressure ulcers History of Present Illness (HPI) ADMISSION 10/29/2020 This is a 75year old woman who is very disabled secondary to advanced Lewy body dementia. Essentially bed and chair bound. She has 100% dependent on her husband for care and he accompanied her today. She also has advanced home care. She was she is here with multiple pressure areas some of them really serious; oo Major area on the left buttock and sacrum completely covered in necrotic surface oo Deep probing area over the left greater trochanter/hip. This goes well into the muscle layer although I did not feel bone. oo Right heel 2 small areas in close juxtaposition both of them necrotic  although they do not connect oo Left heel blister/boggy oo Left medial foot blackened eschar over the fifth metatarsal base oo  Left lateral malleolus also necrotic surface. oo Right first toe 2 small areas in the left second toe also an open area. According to her husband they are using Medihoney although I am not exactly sure which product. They have ordered some form of pressure relief surface for her bed although I do not think they have it yet. They have also ordered a cushion for her wheelchair. She apparently eats well per the husband and there has not been any recent weight loss although she did have some weight loss he thinks it stabilized. She is on Ensure supplements. Past medical history includes Lewy body dementia, open angle glaucoma, lumbar spondylosis Patient History Unable to Obtain Patient History due to Dementia. Allergies No Known Drug Allergies Family History Stroke - Mother, No family history of Cancer, Diabetes, Heart Disease, Hereditary Spherocytosis, Hypertension, Kidney Disease, Lung Disease, Seizures, Thyroid Problems, Tuberculosis. Social History Never smoker, Marital Status - Married, Alcohol Use - Never, Drug Use - No History, Caffeine Use - Moderate. Medical History Eyes Patient has history of Cataracts - fixed with surgery, Glaucoma - fixed with surgery Cardiovascular Patient has history of Hypertension - no longer on meds/weight loss Immunological Patient has history of Lupus Erythematosus - "years ago" Denies history of Raynaudoos, Scleroderma Musculoskeletal Patient has history of Osteoarthritis - (R) knee Neurologic Patient has history of Dementia - lewy body since pre 2016 Hospitalization/Surgery History - constipation/obstipation. Review of Systems (ROS) Constitutional Symptoms (General Health) Denies complaints or symptoms of Fatigue, Fever, Chills, Marked Weight Change. Eyes Denies complaints or symptoms of Dry Eyes, Vision Changes,  Glasses / Contacts. Ear/Nose/Mouth/Throat Denies complaints or symptoms of Chronic sinus problems or rhinitis. Respiratory Denies complaints or symptoms of Chronic or frequent coughs, Shortness of Breath. Gastrointestinal Denies complaints or symptoms of Frequent diarrhea, Nausea, Vomiting. Endocrine Denies complaints or symptoms of Heat/cold intolerance. Genitourinary Denies complaints or symptoms of Frequent urination. Integumentary (Skin) Denies complaints or symptoms of Wounds. Musculoskeletal Complains or has symptoms of Muscle Pain, Muscle Weakness. Psychiatric Denies complaints or symptoms of Claustrophobia, Suicidal. General Notes: limited family information - pt has severe dementia and spouse is not aware of history in family. Objective Constitutional Advanced dementia. Patient lies in bed singing. Vitals Time Taken: 11:05 AM, Height: 67 in, Source: Stated, Source: Stated. General Notes: unable to get vital signs due to pt agitation . weight is not known Respiratory work of breathing is normal. Cardiovascular Both dorsalis pedis is are palpable. Psychiatric Advanced dementia. General Notes: Wound exam; the patient's major wounds are on the left buttock in close proximity and encompassing the sacrum. Completely necrotic surface. I used pickups and a #10 scalpel to remove the tissue necrosis and then a #5 curette to attempt to clean off the surface here. I removed fat and subcutaneous tissue. I do not know that this goes to bone clearly in the muscle layer of her gluteal musculature. oo Second major areas on the left greater trochanter. This has a depth of 4 cm again purulent drainage. oo The right heel had 2 necrotic areas in close juxtaposition both of them debrided down to a better surface. Patient had areas on the right lateral malleolus, left medial foot and left lateral malleolus all of which were covered in black eschar today I did not debride these oo Areas on the  right first and left second toe were very difficult to see Integumentary (Hair, Skin) Wound #1 status is Open. Original cause of wound was Pressure Injury. The date acquired was: 07/29/2020. The wound  is located on the Sacrum. The wound measures 10.8cm length x 15.5cm width x 0.9cm depth; 131.476cm^2 area and 118.328cm^3 volume. There is Fat Layer (Subcutaneous Tissue) exposed. There is no tunneling or undermining noted. There is a large amount of serosanguineous drainage noted. Foul odor after cleansing was noted. The wound margin is flat and intact. There is medium (34-66%) red granulation within the wound bed. There is a medium (34-66%) amount of necrotic tissue within the wound bed including Eschar and Adherent Slough. Wound #10 status is Open. Original cause of wound was Pressure Injury. The date acquired was: 07/29/2020. The wound is located on the Right,Distal T [place]. oe The wound measures 0.8cm length x 1.9cm width x 0.1cm depth; 1.194cm^2 area and 0.119cm^3 volume. There is Fat Layer (Subcutaneous Tissue) exposed. There is no tunneling or undermining noted. There is a medium amount of serosanguineous drainage noted. The wound margin is indistinct and nonvisible. There is no granulation within the wound bed. There is a large (67-100%) amount of necrotic tissue within the wound bed including Adherent Slough. Wound #2 status is Open. Original cause of wound was Pressure Injury. The date acquired was: 07/29/2020. The wound is located on the Left Trochanter. The wound measures 2.2cm length x 1cm width x 1.5cm depth; 1.728cm^2 area and 2.592cm^3 volume. There is muscle and Fat Layer (Subcutaneous Tissue) exposed. There is no undermining noted, however, there is tunneling at 10:00 with a maximum distance of 2.5cm. There is additional tunneling at 11:00 with a maximum distance of 2.3cm, at 12:00 with a maximum distance of 2.7cm, at 1:00 with a maximum distance of 2.3cm, at 2:00 with a maximum distance  of 1.6cm, and at 3:00 with a maximum distance of 1.8cm. There is a large amount of purulent drainage noted. Foul odor after cleansing was noted. The wound margin is thickened. There is small (1-33%) red granulation within the wound bed. There is a large (67-100%) amount of necrotic tissue within the wound bed including Necrosis of Muscle. Wound #3 status is Open. Original cause of wound was Pressure Injury. The date acquired was: 07/29/2020. The wound is located on the Right,Lateral Malleolus. The wound measures 1.4cm length x 2cm width x 0.1cm depth; 2.199cm^2 area and 0.22cm^3 volume. There is Fat Layer (Subcutaneous Tissue) exposed. There is no tunneling or undermining noted. There is a medium amount of serosanguineous drainage noted. The wound margin is distinct with the outline attached to the wound base. There is no granulation within the wound bed. There is a large (67-100%) amount of necrotic tissue within the wound bed including Eschar and Adherent Slough. Wound #4 status is Open. Original cause of wound was Pressure Injury. The date acquired was: 08/29/2020. The wound is located on the Right Calcaneus. The wound measures 4cm length x 2cm width x 0.9cm depth; 6.283cm^2 area and 5.655cm^3 volume. There is Fat Layer (Subcutaneous Tissue) exposed. There is no tunneling or undermining noted. There is a medium amount of serosanguineous drainage noted. The wound margin is fibrotic, thickened scar. There is small (1-33%) red granulation within the wound bed. There is a large (67-100%) amount of necrotic tissue within the wound bed including Eschar and Adherent Slough. Wound #5 status is Open. Original cause of wound was Pressure Injury. The date acquired was: 07/29/2020. The wound is located on the Left Calcaneus. The wound measures 5cm length x 7.5cm width x 0.1cm depth; 29.452cm^2 area and 2.945cm^3 volume. There is no tunneling or undermining noted. There is a none present amount of drainage  noted.  The wound margin is indistinct and nonvisible. There is no granulation within the wound bed. There is no necrotic tissue within the wound bed. General Notes: skin intact - blistered - boggy Wound #6 status is Open. Original cause of wound was Pressure Injury. The date acquired was: 07/29/2020. The wound is located on the Left,Medial Foot. The wound measures 0.7cm length x 0.7cm width x 0.1cm depth; 0.385cm^2 area and 0.038cm^3 volume. There is Fat Layer (Subcutaneous Tissue) exposed. There is a medium amount of serosanguineous drainage noted. The wound margin is flat and intact. There is small (1-33%) granulation within the wound bed. There is a large (67-100%) amount of necrotic tissue within the wound bed including Eschar. Wound #7 status is Open. Original cause of wound was Pressure Injury. The date acquired was: 07/29/2020. The wound is located on the Left,Lateral Malleolus. The wound measures 7.1cm length x 2.1cm width x 0.2cm depth; 11.71cm^2 area and 2.342cm^3 volume. There is Fat Layer (Subcutaneous Tissue) exposed. There is no tunneling or undermining noted. There is a medium amount of serosanguineous drainage noted. The wound margin is flat and intact. There is small (1-33%) red granulation within the wound bed. There is a large (67-100%) amount of necrotic tissue within the wound bed including Eschar. Wound #8 status is Open. Original cause of wound was Pressure Injury. The date acquired was: 07/29/2020. The wound is located on the Left T [place]. The oe wound measures 0.5cm length x 0.9cm width x 0.1cm depth; 0.353cm^2 area and 0.035cm^3 volume. There is Fat Layer (Subcutaneous Tissue) exposed. There is no tunneling or undermining noted. There is a medium amount of serosanguineous drainage noted. The wound margin is flat and intact. There is no granulation within the wound bed. There is a large (67-100%) amount of necrotic tissue within the wound bed including Adherent Slough. Wound #9 status  is Open. Original cause of wound was Pressure Injury. The date acquired was: 07/29/2020. The wound is located on the Right,Lateral T [place]. oe The wound measures 0.7cm length x 0.9cm width x 0.2cm depth; 0.495cm^2 area and 0.099cm^3 volume. There is Fat Layer (Subcutaneous Tissue) exposed. There is no tunneling or undermining noted. There is a medium amount of serosanguineous drainage noted. The wound margin is flat and intact. There is no granulation within the wound bed. There is a large (67-100%) amount of necrotic tissue within the wound bed including Adherent Slough. Assessment Active Problems ICD-10 Dementia with Lewy bodies Pressure ulcer of left hip, stage 4 Pressure ulcer of sacral region, unstageable Pressure ulcer of right ankle, unstageable Pressure ulcer of right heel, stage 4 Pressure ulcer of left heel, stage 2 Non-pressure chronic ulcer of other part of left foot with other specified severity Pressure ulcer of left ankle, unstageable Non-pressure chronic ulcer of other part of right foot with other specified severity Non-pressure chronic ulcer of other part of left foot with other specified severity Procedures Wound #1 Pre-procedure diagnosis of Wound #1 is a Pressure Ulcer located on the Sacrum . There was a Excisional Skin/Subcutaneous Tissue Debridement with a total area of 167.4 sq cm performed by Ricard Dillon., MD. With the following instrument(s): Blade, Curette, and Forceps to remove Viable and Non-Viable tissue/material. Material removed includes Eschar, Subcutaneous Tissue, Slough, Skin: Dermis, and Skin: Epidermis after achieving pain control using Lidocaine. No specimens were taken. A time out was conducted at 12:17, prior to the start of the procedure. A Minimum amount of bleeding was controlled with Pressure. The procedure was tolerated  well with a pain level of 0 throughout and a pain level of 0 following the procedure. Post Debridement Measurements: 10.8cm  length x 15.5cm width x 0.9cm depth; 118.328cm^3 volume. Post debridement Stage noted as Unstageable/Unclassified. Character of Wound/Ulcer Post Debridement is improved. Post procedure Diagnosis Wound #1: Same as Pre-Procedure Wound #4 Pre-procedure diagnosis of Wound #4 is a Pressure Ulcer located on the Right Calcaneus . There was a Excisional Skin/Subcutaneous Tissue Debridement with a total area of 8 sq cm performed by Ricard Dillon., MD. With the following instrument(s): Blade, Curette, and Forceps to remove Viable and Non-Viable tissue/material. Material removed includes Eschar, Subcutaneous Tissue, Slough, Skin: Dermis, and Skin: Epidermis after achieving pain control using Lidocaine. No specimens were taken. A time out was conducted at 12:25, prior to the start of the procedure. A Minimum amount of bleeding was controlled with Pressure. The procedure was tolerated well with a pain level of 0 throughout and a pain level of 0 following the procedure. Post Debridement Measurements: 4cm length x 2cm width x 0.9cm depth; 5.655cm^3 volume. Post debridement Stage noted as Unstageable/Unclassified. Character of Wound/Ulcer Post Debridement is improved. Post procedure Diagnosis Wound #4: Same as Pre-Procedure Plan Follow-up Appointments: Return Appointment in 1 week. - Dr. Dellia Nims Bathing/ Shower/ Hygiene: May shower with protection but do not get wound dressing(s) wet. Off-Loading: Turn and reposition every 2 hours Other: - Float heels!!!! Home Health: New wound care orders this week; continue Home Health for wound care. May utilize formulary equivalent dressing for wound treatment orders unless otherwise specified. - [facility] to change MOnday's, Wednesday's, Friday's Radiology ordered were: X-ray, coccyx The following medication(s) was prescribed: Augmentin oral 250 mg/5 mL-62.5 mg/5 mL suspension for reconstitution suspension for reconstitution oral '500mg'$  (65m) tid for 10 days for  wound infection starting 10/29/2020 WOUND #1: - Sacrum Wound Laterality: Cleanser: Wound Cleanser (Home Health) Every Other Day/15 Days Discharge Instructions: Cleanse the wound with wound cleanser prior to applying a clean dressing using gauze sponges, not tissue or cotton balls. Peri-Wound Care: Zinc Oxide Ointment 30g tube ([facility] Every Other Day/15 Days Discharge Instructions: Apply Zinc Oxide to periwound with each dressing change Prim Dressing: KerraCel Ag Gelling Fiber Dressing, 4x5 in (silver alginate) (Home Health) Every Other Day/15 Days ary Discharge Instructions: Apply silver alginate to wound bed as instructed Secondary Dressing: Woven Gauze Sponge, Non-Sterile 4x4 in ([facility] Every Other Day/15 Days Discharge Instructions: Apply over primary dressing as directed. Secondary Dressing: ABD Pad, 5x9 ([facility] Every Other Day/15 Days Discharge Instructions: Apply over primary dressing as directed. Secured With: 75M Medipore H Soft Cloth Surgical T 4 x 2 (in/yd) (Home Health) Every Other Day/15 Days ape Discharge Instructions: Secure dressing with tape as directed. WOUND #10: - T Great Wound Laterality: Right, Distal oe Cleanser: Wound Cleanser (Home Health) Every Other Day/15 Days Discharge Instructions: Cleanse the wound with wound cleanser prior to applying a clean dressing using gauze sponges, not tissue or cotton balls. Peri-Wound Care: Zinc Oxide Ointment 30g tube ([facility] Every Other Day/15 Days Discharge Instructions: Apply Zinc Oxide to periwound with each dressing change Prim Dressing: KerraCel Ag Gelling Fiber Dressing, 4x5 in (silver alginate) (Home Health) Every Other Day/15 Days ary Discharge Instructions: Apply silver alginate to wound bed as instructed Secondary Dressing: Woven Gauze Sponge, Non-Sterile 4x4 in ([facility] Every Other Day/15 Days Discharge Instructions: Apply over primary dressing as directed. Secondary Dressing: ABD Pad, 5x9  ([facility] Every Other Day/15 Days Discharge Instructions: Apply over primary dressing  as directed. Secured With: [employer], 4.5x3.1 (in/yd) [facility]) Every Other Day/15 Days Discharge Instructions: Secure with Kerlix as directed. Secured With: 60M Medipore H Soft Cloth Surgical T 4 x 2 (in/yd) (Home Health) Every Other Day/15 Days ape Discharge Instructions: Secure dressing with tape as directed. WOUND #2: - Trochanter Wound Laterality: Left Cleanser: Wound Cleanser (Home Health) Every Other Day/15 Days Discharge Instructions: Cleanse the wound with wound cleanser prior to applying a clean dressing using gauze sponges, not tissue or cotton balls. Peri-Wound Care: Zinc Oxide Ointment 30g tube [facility]) Every Other Day/15 Days Discharge Instructions: Apply Zinc Oxide to periwound with each dressing change Prim Dressing: KerraCel Ag Gelling Fiber Dressing, 4x5 in (silver alginate) (Home Health) Every Other Day/15 Days ary Discharge Instructions: Apply silver alginate to wound bed as instructed Secondary Dressing: Woven Gauze Sponge, Non-Sterile 4x4 in [facility]) Every Other Day/15 Days Discharge Instructions: Apply over primary dressing as directed. Secondary Dressing: ABD Pad, 5x9 [facility]) Every Other Day/15 Days Discharge Instructions: Apply over primary dressing as directed. Secured With: 60M Medipore H Soft Cloth Surgical T 4 x 2 (in/yd) (Home Health) Every Other Day/15 Days ape Discharge Instructions: Secure dressing with tape as directed. WOUND #3: - Malleolus Wound Laterality: Right, Lateral Cleanser: Wound Cleanser (Home Health) Every Other Day/15 Days Discharge Instructions: Cleanse the wound with wound cleanser prior to applying a clean dressing using gauze sponges, not tissue or cotton balls. Peri-Wound Care: Zinc Oxide Ointment 30g tube [facility]) Every Other Day/15 Days Discharge Instructions: Apply Zinc Oxide to periwound with each dressing  change Prim Dressing: KerraCel Ag Gelling Fiber Dressing, 4x5 in (silver alginate) (Home Health) Every Other Day/15 Days ary Discharge Instructions: Apply silver alginate to wound bed as instructed Secondary Dressing: Woven Gauze Sponge, Non-Sterile 4x4 in [facility]) Every Other Day/15 Days Discharge Instructions: Apply over primary dressing as directed. Secondary Dressing: ABD Pad, 5x9 [facility]) Every Other Day/15 Days Discharge Instructions: Apply over primary dressing as directed. Secured With: [employer], 4.5x3.1 (in/yd) [facility]) Every Other Day/15 Days Discharge Instructions: Secure with Kerlix as directed. Secured With: 60M Medipore H Soft Cloth Surgical T 4 x 2 (in/yd) (Home Health) Every Other Day/15 Days ape Discharge Instructions: Secure dressing with tape as directed. WOUND #4: - Calcaneus Wound Laterality: Right Cleanser: Wound Cleanser (Home Health) Every Other Day/15 Days Discharge Instructions: Cleanse the wound with wound cleanser prior to applying a clean dressing using gauze sponges, not tissue or cotton balls. Peri-Wound Care: Zinc Oxide Ointment 30g tube [facility]) Every Other Day/15 Days Discharge Instructions: Apply Zinc Oxide to periwound with each dressing change Prim Dressing: KerraCel Ag Gelling Fiber Dressing, 4x5 in (silver alginate) (Home Health) Every Other Day/15 Days ary Discharge Instructions: Apply silver alginate to wound bed as instructed Secondary Dressing: Woven Gauze Sponge, Non-Sterile 4x4 in [facility]) Every Other Day/15 Days Discharge Instructions: Apply over primary dressing as directed. Secondary Dressing: ABD Pad, 5x9 [facility]) Every Other Day/15 Days Discharge Instructions: Apply over primary dressing as directed. Secured With: [employer], 4.5x3.1 (in/yd) [facility]) Every Other Day/15 Days Discharge Instructions: Secure with Kerlix as directed. Secured With: 60M Medipore H Soft Cloth Surgical T 4 x 2  (in/yd) (Home Health) Every Other Day/15 Days ape Discharge Instructions: Secure dressing with tape as directed. WOUND #5: - Calcaneus Wound Laterality: Left Cleanser: Wound Cleanser (Home Health) Every Other Day/15 Days Discharge Instructions: Cleanse the wound with wound cleanser prior to applying a clean dressing  using gauze sponges, not tissue or cotton balls. Peri-Wound Care: Zinc Oxide Ointment 30g tube [facility]) Every Other Day/15 Days Discharge Instructions: Apply Zinc Oxide to periwound with each dressing change Prim Dressing: KerraCel Ag Gelling Fiber Dressing, 4x5 in (silver alginate) (Home Health) Every Other Day/15 Days ary Discharge Instructions: Apply silver alginate to wound bed as instructed Secondary Dressing: Woven Gauze Sponge, Non-Sterile 4x4 in [facility]) Every Other Day/15 Days Discharge Instructions: Apply over primary dressing as directed. Secondary Dressing: ABD Pad, 5x9 [facility]) Every Other Day/15 Days Discharge Instructions: Apply over primary dressing as directed. Secured With: [employer], 4.5x3.1 (in/yd) [facility]) Every Other Day/15 Days Discharge Instructions: Secure with Kerlix as directed. Secured With: 78M Medipore H Soft Cloth Surgical T 4 x 2 (in/yd) (Home Health) Every Other Day/15 Days ape Discharge Instructions: Secure dressing with tape as directed. WOUND #6: - Foot Wound Laterality: Left, Medial Cleanser: Wound Cleanser (Home Health) Every Other Day/15 Days Discharge Instructions: Cleanse the wound with wound cleanser prior to applying a clean dressing using gauze sponges, not tissue or cotton balls. Peri-Wound Care: Zinc Oxide Ointment 30g tube [facility]) Every Other Day/15 Days Discharge Instructions: Apply Zinc Oxide to periwound with each dressing change Prim Dressing: KerraCel Ag Gelling Fiber Dressing, 4x5 in (silver alginate) (Home Health) Every Other Day/15 Days ary Discharge Instructions: Apply silver alginate to  wound bed as instructed Secondary Dressing: Woven Gauze Sponge, Non-Sterile 4x4 in [facility]) Every Other Day/15 Days Discharge Instructions: Apply over primary dressing as directed. Secondary Dressing: ABD Pad, 5x9 [facility]) Every Other Day/15 Days Discharge Instructions: Apply over primary dressing as directed. Secured With: [employer], 4.5x3.1 (in/yd) [facility]) Every Other Day/15 Days Discharge Instructions: Secure with Kerlix as directed. Secured With: 78M Medipore H Soft Cloth Surgical T 4 x 2 (in/yd) (Home Health) Every Other Day/15 Days ape Discharge Instructions: Secure dressing with tape as directed. WOUND #7: - Malleolus Wound Laterality: Left, Lateral Cleanser: Wound Cleanser (Home Health) Every Other Day/15 Days Discharge Instructions: Cleanse the wound with wound cleanser prior to applying a clean dressing using gauze sponges, not tissue or cotton balls. Peri-Wound Care: Zinc Oxide Ointment 30g tube [facility]) Every Other Day/15 Days Discharge Instructions: Apply Zinc Oxide to periwound with each dressing change Prim Dressing: KerraCel Ag Gelling Fiber Dressing, 4x5 in (silver alginate) (Home Health) Every Other Day/15 Days ary Discharge Instructions: Apply silver alginate to wound bed as instructed Secondary Dressing: Woven Gauze Sponge, Non-Sterile 4x4 in [facility]) Every Other Day/15 Days Discharge Instructions: Apply over primary dressing as directed. Secondary Dressing: ABD Pad, 5x9 [facility]) Every Other Day/15 Days Discharge Instructions: Apply over primary dressing as directed. Secured With: [employer], 4.5x3.1 (in/yd) [facility]) Every Other Day/15 Days Discharge Instructions: Secure with Kerlix as directed. Secured With: 78M Medipore H Soft Cloth Surgical T 4 x 2 (in/yd) (Home Health) Every Other Day/15 Days ape Discharge Instructions: Secure dressing with tape as directed. WOUND #8: - T Second Wound Laterality:  Left oe Cleanser: Wound Cleanser (Home Health) Every Other Day/15 Days Discharge Instructions: Cleanse the wound with wound cleanser prior to applying a clean dressing using gauze sponges, not tissue or cotton balls. Peri-Wound Care: Zinc Oxide Ointment 30g tube [facility]) Every Other Day/15 Days Discharge Instructions: Apply Zinc Oxide to periwound with each dressing change Prim Dressing: KerraCel Ag Gelling Fiber Dressing, 4x5 in (silver alginate) (Home Health) Every Other Day/15 Days ary Discharge Instructions: Apply silver alginate to wound  bed as instructed Secondary Dressing: Woven Gauze Sponge, Non-Sterile 4x4 in [facility]) Every Other Day/15 Days Discharge Instructions: Apply over primary dressing as directed. Secondary Dressing: ABD Pad, 5x9 [facility]) Every Other Day/15 Days Discharge Instructions: Apply over primary dressing as directed. Secured With: [employer], 4.5x3.1 (in/yd) [facility]) Every Other Day/15 Days Discharge Instructions: Secure with Kerlix as directed. Secured With: 60M Medipore H Soft Cloth Surgical T 4 x 2 (in/yd) (Home Health) Every Other Day/15 Days ape Discharge Instructions: Secure dressing with tape as directed. WOUND #9: - T Great Wound Laterality: Right, Lateral oe Cleanser: Wound Cleanser (Home Health) Every Other Day/15 Days Discharge Instructions: Cleanse the wound with wound cleanser prior to applying a clean dressing using gauze sponges, not tissue or cotton balls. Peri-Wound Care: Zinc Oxide Ointment 30g tube [facility]) Every Other Day/15 Days Discharge Instructions: Apply Zinc Oxide to periwound with each dressing change Prim Dressing: KerraCel Ag Gelling Fiber Dressing, 4x5 in (silver alginate) (Home Health) Every Other Day/15 Days ary Discharge Instructions: Apply silver alginate to wound bed as instructed Secondary Dressing: Woven Gauze Sponge, Non-Sterile 4x4 in [facility]) Every Other Day/15 Days Discharge  Instructions: Apply over primary dressing as directed. Secondary Dressing: ABD Pad, 5x9 [facility]) Every Other Day/15 Days Discharge Instructions: Apply over primary dressing as directed. Secured With: [employer], 4.5x3.1 (in/yd) [facility]) Every Other Day/15 Days Discharge Instructions: Secure with Kerlix as directed. Secured With: 60M Medipore H Soft Cloth Surgical T 4 x 2 (in/yd) (Home Health) Every Other Day/15 Days ape Discharge Instructions: Secure dressing with tape as directed. 1. Cultures done of the sacrum/left buttock and the left greater trochanter. 2. Empiric Augmentin 500 3 times daily [solution] 3. Silver alginate to all the remaining wounds 4. Considerable additional debridements will need to be done starting next time she is here including the right lateral malleolus, left medial foot, left lateral malleolus 5. I have emphasized to the patient's husband that limited pressure to more than an hour is going to be required. He can use this time for a meal but she cannot be on this much more than that. 6. I have done a plain x-ray of the pelvis and hip underlying soft tissue infection here is not impossible also of course osteomyelitis. 7. For now I think it is within the patient's best interest to maintain care at home. It is difficult to see this patient doing well in a hospital setting because of the advanced dementia and restlessness, although this may come to fruition. 8. I am very doubtful that this patient will completely heal but I did not get into the discussion with her husband at this visit I spent 50 minutes in review of this patient's past medical history, face-to-face evaluation and preparation of this record Electronic Signature(s) Signed: 10/29/2020 5:29:17 PM By: Linton Ham MD Entered By: Linton Ham on 10/29/2020 13:00:52 -------------------------------------------------------------------------------- HxROS Details Patient Name: Date of  Service: Tanya Jensen, NA NCY L. 10/29/2020 10:30 A M Medical Record Number: [id number] Patient Account Number: [id number] Date of Birth/Sex: Treating RN: 10-28-1945 (75 y.o. Debby Bud Primary Care Provider: Other Clinician: Shirline Frees Referring Provider: Treating Provider/Extender: Ponciano Ort in Treatment: 0 Unable to Obtain Patient History due to Dementia Constitutional Symptoms (General Health) Complaints and Symptoms: Negative for: Fatigue; Fever; Chills; Marked Weight Change Eyes Complaints and Symptoms: Negative for: Dry Eyes; Vision Changes; Glasses / Contacts Medical History: Positive for: Cataracts - fixed with surgery; Glaucoma - fixed  with surgery Ear/Nose/Mouth/Throat Complaints and Symptoms: Negative for: Chronic sinus problems or rhinitis Respiratory Complaints and Symptoms: Negative for: Chronic or frequent coughs; Shortness of Breath Gastrointestinal Complaints and Symptoms: Negative for: Frequent diarrhea; Nausea; Vomiting Endocrine Complaints and Symptoms: Negative for: Heat/cold intolerance Genitourinary Complaints and Symptoms: Negative for: Frequent urination Integumentary (Skin) Complaints and Symptoms: Negative for: Wounds Musculoskeletal Complaints and Symptoms: Positive for: Muscle Pain; Muscle Weakness Medical History: Positive for: Osteoarthritis - (R) knee Psychiatric Complaints and Symptoms: Negative for: Claustrophobia; Suicidal Hematologic/Lymphatic Cardiovascular Medical History: Positive for: Hypertension - no longer on meds/weight loss Immunological Medical History: Positive for: Lupus Erythematosus - "years ago" Negative for: Raynauds; Scleroderma Neurologic Medical History: Positive for: Dementia - lewy body since pre 2016 Oncologic HBO Extended History Items Eyes: Eyes: Cataracts Glaucoma Immunizations Pneumococcal Vaccine: Received Pneumococcal Vaccination: No Immunization  Notes: has not had pneumonia shot - not planning on taking unknown tetanus shot Implantable Devices None Hospitalization / Surgery History Type of Hospitalization/Surgery constipation/obstipation Family and Social History Cancer: No; Diabetes: No; Heart Disease: No; Hereditary Spherocytosis: No; Hypertension: No; Kidney Disease: No; Lung Disease: No; Seizures: No; Stroke: Yes - Mother; Thyroid Problems: No; Tuberculosis: No; Never smoker; Marital Status - Married; Alcohol Use: Never; Drug Use: No History; Caffeine Use: Moderate; Financial Concerns: No; Food, Clothing or Shelter Needs: No; Support System Lacking: No; Transportation Concerns: No Notes limited family information - pt has severe dementia and spouse is not aware of history in family. Electronic Signature(s) Signed: 11/21/2020 9:05:33 PM By: Deon Pilling Signed: 11/25/2020 8:23:39 AM By: Linton Ham MD Previous Signature: 10/29/2020 5:29:17 PM Version By: Linton Ham MD Previous Signature: 11/18/2020 8:58:48 PM Version By: Deon Pilling Entered By: Deon Pilling on 11/21/2020 21:01:44 -------------------------------------------------------------------------------- SuperBill Details Patient Name: Date of Service: Tanya Jensen 10/29/2020 Medical Record Number: [id number] Patient Account Number: [id number] Date of Birth/Sex: Treating RN: August 19, 1945 (75 y.o. Tanya Jensen, Tanya Jensen Primary Care Provider: Shirline Frees Other Clinician: Referring Provider: Treating Provider/Extender: Ponciano Ort in Treatment: 0 Diagnosis Coding ICD-10 Codes Code Description G31.83 Dementia with Lewy bodies [phone] Pressure ulcer of left hip, stage 4 L89.150 Pressure ulcer of sacral region, unstageable L89.510 Pressure ulcer of right ankle, unstageable L89.614 Pressure ulcer of right heel, stage 4 L97.528 Non-pressure chronic ulcer of other part of left foot with other specified severity L89.520 Pressure  ulcer of left ankle, unstageable L97.518 Non-pressure chronic ulcer of other part of right foot with other specified severity L97.528 Non-pressure chronic ulcer of other part of left foot with other specified severity Facility Procedures CPT4 Code: [id number] Description: 4 - WOUND CARE VISIT-LEV 4 NEW PT 1 Modifier: Quantity: CPT4 Code: [id number] ICD- L89 L89 Description: 2 - DEB SUBQ TISSUE 20 SQ CM/< 1 10 Diagnosis Description .150 Pressure ulcer of sacral region, unstageable .614 Pressure ulcer of right heel, stage 4 Modifier: Quantity: CPT4 Code: [id number] ICD- L89 L89 Description: 5 - DEB SUBQ TISS EA ADDL 20CM 8 10 Diagnosis Description .150 Pressure ulcer of sacral region, unstageable .614 Pressure ulcer of right heel, stage 4 Modifier: Quantity: Physician Procedures : CPT4 Code Description Modifier [id number] - WC PHYS LEVEL 4 - NEW PT 25 ICD-10 Diagnosis Description G31.83 Dementia with Lewy bodies L89.224 Pressure ulcer of left hip, stage 4 L89.150 Pressure ulcer of sacral region, unstageable L89.614 Pressure  ulcer of right heel, stage 4 Quantity: 1 : [id number] - WC PHYS SUBQ TISS 20 SQ CM ICD-10 Diagnosis Description L89.150 Pressure ulcer of sacral  region, unstageable L89.614 Pressure ulcer of right heel, stage 4 Quantity: 1 : [id number] - WC PHYS SUBQ TISS EA ADDL 20 CM ICD-10 Diagnosis Description L89.150 Pressure ulcer of sacral region, unstageable L89.614 Pressure ulcer of right heel, stage 4 Quantity: 8 Electronic Signature(s) Signed: 10/29/2020 5:29:17 PM By: Linton Ham MD Signed: 10/29/2020 5:48:53 PM By: Rhae Hammock RN Entered By: Rhae Hammock on 10/29/2020 17:14:48

## 2020-12-02 DEATH — deceased
